# Patient Record
Sex: Female | Born: 1995 | Race: White | Hispanic: No | Marital: Single | Smoking: Current every day smoker
Health system: Southern US, Community
[De-identification: ages and names within clinical notes are randomized; demographics above are authoritative.]

## PROBLEM LIST (undated history)

## (undated) ENCOUNTER — Inpatient Hospital Stay: Payer: Self-pay

## (undated) ENCOUNTER — Emergency Department: Payer: Medicaid Other | Source: Home / Self Care

## (undated) DIAGNOSIS — D649 Anemia, unspecified: Secondary | ICD-10-CM

## (undated) DIAGNOSIS — K219 Gastro-esophageal reflux disease without esophagitis: Secondary | ICD-10-CM

## (undated) DIAGNOSIS — S0300XA Dislocation of jaw, unspecified side, initial encounter: Secondary | ICD-10-CM

## (undated) DIAGNOSIS — L409 Psoriasis, unspecified: Secondary | ICD-10-CM

## (undated) DIAGNOSIS — O4590 Premature separation of placenta, unspecified, unspecified trimester: Secondary | ICD-10-CM

## (undated) DIAGNOSIS — Z3201 Encounter for pregnancy test, result positive: Secondary | ICD-10-CM

## (undated) DIAGNOSIS — G43909 Migraine, unspecified, not intractable, without status migrainosus: Secondary | ICD-10-CM

## (undated) HISTORY — PX: WISDOM TOOTH EXTRACTION: SHX21

## (undated) HISTORY — DX: Encounter for pregnancy test, result positive: Z32.01

## (undated) HISTORY — DX: Psoriasis, unspecified: L40.9

## (undated) HISTORY — DX: Migraine, unspecified, not intractable, without status migrainosus: G43.909

## (undated) HISTORY — DX: Dislocation of jaw, unspecified side, initial encounter: S03.00XA

---
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
## 2013-01-15 LAB — LIPID PANEL
Cholesterol: 168 mg/dL (ref 0–200)
HDL: 28 mg/dL — AB (ref 35–70)
LDL CALC: 125 mg/dL
TRIGLYCERIDES: 73 mg/dL (ref 40–160)

## 2013-06-08 ENCOUNTER — Emergency Department: Payer: Self-pay | Admitting: Emergency Medicine

## 2013-06-08 LAB — URINALYSIS, COMPLETE
Bilirubin,UR: NEGATIVE
Hyaline Cast: 1
Leukocyte Esterase: NEGATIVE
Ph: 6 (ref 4.5–8.0)
RBC,UR: <1 /HPF (ref 0–5)
WBC UR: <1 /HPF (ref 0–5)

## 2013-06-08 LAB — BASIC METABOLIC PANEL
Anion Gap: 6 — ABNORMAL LOW (ref 7–16)
Calcium, Total: 8.8 mg/dL — ABNORMAL LOW (ref 9.0–10.7)
Chloride: 109 mmol/L — ABNORMAL HIGH (ref 98–107)
Co2: 26 mmol/L (ref 21–32)
EGFR (African American): >60
EGFR (Non-African Amer.): >60
Glucose: 115 mg/dL — ABNORMAL HIGH (ref 65–99)
Osmolality: 281 (ref 275–301)
Potassium: 3.6 mmol/L (ref 3.5–5.1)
Sodium: 141 mmol/L (ref 136–145)

## 2013-06-08 LAB — PREGNANCY, URINE: Pregnancy Test, Urine: NEGATIVE m[IU]/mL

## 2013-06-08 LAB — CBC
HCT: 37.6 % (ref 35.0–47.0)
MCH: 32.3 pg (ref 26.0–34.0)
MCHC: 34.6 g/dL (ref 32.0–36.0)
MCV: 93 fL (ref 80–100)
Platelet: 345 10*3/uL (ref 150–440)
RBC: 4.03 10*6/uL (ref 3.80–5.20)
RDW: 12.9 % (ref 11.5–14.5)

## 2013-06-08 LAB — GC/CHLAMYDIA PROBE AMP

## 2014-01-03 ENCOUNTER — Emergency Department: Payer: Self-pay | Admitting: Emergency Medicine

## 2014-01-03 LAB — URINALYSIS, COMPLETE
BLOOD: NEGATIVE
Bilirubin,UR: NEGATIVE
GLUCOSE, UR: NEGATIVE mg/dL (ref 0–75)
Ketone: NEGATIVE
Leukocyte Esterase: NEGATIVE
NITRITE: NEGATIVE
Ph: 6 (ref 4.5–8.0)
RBC,UR: 5 /HPF (ref 0–5)
SPECIFIC GRAVITY: 1.03 (ref 1.003–1.030)
Squamous Epithelial: 2

## 2014-04-02 ENCOUNTER — Emergency Department: Payer: Self-pay | Admitting: Emergency Medicine

## 2014-04-03 LAB — URINALYSIS, COMPLETE
BLOOD: NEGATIVE
Bilirubin,UR: NEGATIVE
Glucose,UR: NEGATIVE mg/dL (ref 0–75)
Ketone: NEGATIVE
LEUKOCYTE ESTERASE: NEGATIVE
NITRITE: POSITIVE
PH: 7 (ref 4.5–8.0)
Protein: NEGATIVE
RBC,UR: NONE SEEN /HPF (ref 0–5)
Specific Gravity: 1.015 (ref 1.003–1.030)
Squamous Epithelial: NONE SEEN
WBC UR: 2 /HPF (ref 0–5)

## 2014-05-19 ENCOUNTER — Emergency Department: Payer: Self-pay | Admitting: Internal Medicine

## 2014-05-20 ENCOUNTER — Other Ambulatory Visit: Payer: Self-pay | Admitting: Certified Nurse Midwife

## 2014-05-20 LAB — HCG, QUANTITATIVE, PREGNANCY: BETA HCG, QUANT.: 2083 m[IU]/mL — AB

## 2014-05-22 ENCOUNTER — Other Ambulatory Visit: Payer: Self-pay | Admitting: Certified Nurse Midwife

## 2014-05-22 LAB — HCG, QUANTITATIVE, PREGNANCY: BETA HCG, QUANT.: 5030 m[IU]/mL — AB

## 2014-06-08 ENCOUNTER — Other Ambulatory Visit: Payer: Self-pay | Admitting: Obstetrics & Gynecology

## 2014-07-12 ENCOUNTER — Emergency Department: Payer: Self-pay | Admitting: Emergency Medicine

## 2014-07-12 LAB — COMPREHENSIVE METABOLIC PANEL
ALK PHOS: 65 U/L
ANION GAP: 8 (ref 7–16)
AST: 12 U/L — AB (ref 15–37)
Albumin: 3.1 g/dL — ABNORMAL LOW (ref 3.4–5.0)
BUN: 7 mg/dL (ref 7–18)
Bilirubin,Total: 0.4 mg/dL (ref 0.2–1.0)
Calcium, Total: 8 mg/dL — ABNORMAL LOW (ref 8.5–10.1)
Chloride: 108 mmol/L — ABNORMAL HIGH (ref 98–107)
Co2: 22 mmol/L (ref 21–32)
Creatinine: 0.5 mg/dL — ABNORMAL LOW (ref 0.60–1.30)
EGFR (African American): >60
EGFR (Non-African Amer.): >60
Glucose: 87 mg/dL (ref 65–99)
Osmolality: 273 (ref 275–301)
Potassium: 3.1 mmol/L — ABNORMAL LOW (ref 3.5–5.1)
SGPT (ALT): 16 U/L (ref 12–78)
Sodium: 138 mmol/L (ref 136–145)
Total Protein: 6.2 g/dL — ABNORMAL LOW (ref 6.4–8.2)

## 2014-07-12 LAB — CBC
HCT: 32.2 % — AB (ref 35.0–47.0)
HGB: 11.4 g/dL — AB (ref 12.0–16.0)
MCH: 33.3 pg (ref 26.0–34.0)
MCHC: 35.4 g/dL (ref 32.0–36.0)
MCV: 94 fL (ref 80–100)
Platelet: 301 10*3/uL (ref 150–440)
RBC: 3.42 10*6/uL — ABNORMAL LOW (ref 3.80–5.20)
RDW: 12.3 % (ref 11.5–14.5)
WBC: 9.3 10*3/uL (ref 3.6–11.0)

## 2014-07-12 LAB — HCG, QUANTITATIVE, PREGNANCY: Beta Hcg, Quant.: 97189 m[IU]/mL — ABNORMAL HIGH

## 2014-07-13 ENCOUNTER — Emergency Department: Payer: Self-pay | Admitting: Emergency Medicine

## 2014-08-12 ENCOUNTER — Emergency Department: Payer: Self-pay | Admitting: Emergency Medicine

## 2014-08-12 LAB — COMPREHENSIVE METABOLIC PANEL
ALT: 31 U/L
AST: 38 U/L — AB (ref 15–37)
Albumin: 2.8 g/dL — ABNORMAL LOW (ref 3.4–5.0)
Alkaline Phosphatase: 58 U/L
Anion Gap: 12 (ref 7–16)
BUN: 3 mg/dL — ABNORMAL LOW (ref 7–18)
Bilirubin,Total: 0.3 mg/dL (ref 0.2–1.0)
CHLORIDE: 110 mmol/L — AB (ref 98–107)
CO2: 20 mmol/L — AB (ref 21–32)
CREATININE: 0.62 mg/dL (ref 0.60–1.30)
Calcium, Total: 8.3 mg/dL — ABNORMAL LOW (ref 8.5–10.1)
EGFR (African American): >60
GLUCOSE: 98 mg/dL (ref 65–99)
Osmolality: 280 (ref 275–301)
POTASSIUM: 3 mmol/L — AB (ref 3.5–5.1)
Sodium: 142 mmol/L (ref 136–145)
Total Protein: 6.1 g/dL — ABNORMAL LOW (ref 6.4–8.2)

## 2014-08-12 LAB — CBC
HCT: 30.6 % — ABNORMAL LOW (ref 35.0–47.0)
HGB: 10.6 g/dL — ABNORMAL LOW (ref 12.0–16.0)
MCH: 33.4 pg (ref 26.0–34.0)
MCHC: 34.8 g/dL (ref 32.0–36.0)
MCV: 96 fL (ref 80–100)
Platelet: 324 10*3/uL (ref 150–440)
RBC: 3.19 10*6/uL — AB (ref 3.80–5.20)
RDW: 12.7 % (ref 11.5–14.5)
WBC: 8.7 10*3/uL (ref 3.6–11.0)

## 2014-08-12 LAB — URINALYSIS, COMPLETE
BLOOD: NEGATIVE
Bilirubin,UR: NEGATIVE
Glucose,UR: NEGATIVE mg/dL (ref 0–75)
Ketone: NEGATIVE
Leukocyte Esterase: NEGATIVE
NITRITE: NEGATIVE
Ph: 7 (ref 4.5–8.0)
Protein: NEGATIVE
RBC,UR: <1 /HPF (ref 0–5)
Specific Gravity: 1.005 (ref 1.003–1.030)

## 2014-08-12 LAB — WET PREP, GENITAL

## 2014-08-12 LAB — LIPASE, BLOOD: Lipase: 138 U/L (ref 73–393)

## 2014-08-12 LAB — GC/CHLAMYDIA PROBE AMP

## 2014-08-14 LAB — URINE CULTURE

## 2014-09-12 ENCOUNTER — Encounter: Payer: Self-pay | Admitting: Maternal & Fetal Medicine

## 2014-10-06 ENCOUNTER — Observation Stay: Payer: Self-pay

## 2014-10-06 LAB — URINALYSIS, COMPLETE
BLOOD: NEGATIVE
Bilirubin,UR: NEGATIVE
GLUCOSE, UR: NEGATIVE mg/dL (ref 0–75)
Ketone: NEGATIVE
Leukocyte Esterase: NEGATIVE
NITRITE: NEGATIVE
PH: 7 (ref 4.5–8.0)
PROTEIN: NEGATIVE
RBC,UR: 1 /HPF (ref 0–5)
Specific Gravity: 1.003 (ref 1.003–1.030)
Squamous Epithelial: 2
WBC UR: 3 /HPF (ref 0–5)

## 2014-10-24 ENCOUNTER — Encounter: Payer: Self-pay | Admitting: Obstetrics & Gynecology

## 2014-11-01 ENCOUNTER — Ambulatory Visit: Payer: Self-pay | Admitting: Certified Nurse Midwife

## 2014-11-01 LAB — GLUCOSE, 1 HOUR GESTATIONAL: Glucose 1 Hour: 125

## 2014-11-13 ENCOUNTER — Observation Stay: Payer: Self-pay | Admitting: Obstetrics & Gynecology

## 2014-12-05 ENCOUNTER — Encounter: Payer: Self-pay | Admitting: Obstetrics and Gynecology

## 2014-12-12 ENCOUNTER — Observation Stay: Payer: Self-pay

## 2014-12-12 LAB — URINALYSIS, COMPLETE
BILIRUBIN, UR: NEGATIVE
Blood: NEGATIVE
GLUCOSE, UR: NEGATIVE mg/dL (ref 0–75)
Ketone: NEGATIVE
Leukocyte Esterase: NEGATIVE
Nitrite: NEGATIVE
Ph: 7 (ref 4.5–8.0)
Protein: NEGATIVE
RBC,UR: NONE SEEN /HPF (ref 0–5)
Specific Gravity: 1.004 (ref 1.003–1.030)

## 2014-12-25 ENCOUNTER — Observation Stay: Payer: Self-pay

## 2015-01-05 ENCOUNTER — Observation Stay: Payer: Self-pay | Admitting: Obstetrics and Gynecology

## 2015-01-05 LAB — DRUG SCREEN, URINE
Amphetamines, Ur Screen: NEGATIVE (ref ?–1000)
BARBITURATES, UR SCREEN: NEGATIVE (ref ?–200)
Benzodiazepine, Ur Scrn: NEGATIVE (ref ?–200)
COCAINE METABOLITE, UR ~~LOC~~: NEGATIVE (ref ?–300)
Cannabinoid 50 Ng, Ur ~~LOC~~: NEGATIVE (ref ?–50)
MDMA (ECSTASY) UR SCREEN: NEGATIVE (ref ?–500)
Methadone, Ur Screen: NEGATIVE (ref ?–300)
OPIATE, UR SCREEN: NEGATIVE (ref ?–300)
Phencyclidine (PCP) Ur S: NEGATIVE (ref ?–25)
Tricyclic, Ur Screen: NEGATIVE (ref ?–1000)

## 2015-01-05 LAB — URINALYSIS, COMPLETE
BILIRUBIN, UR: NEGATIVE
Blood: NEGATIVE
Glucose,UR: NEGATIVE mg/dL (ref 0–75)
Ketone: NEGATIVE
Nitrite: NEGATIVE
Ph: 7 (ref 4.5–8.0)
Protein: NEGATIVE
Specific Gravity: 1.004 (ref 1.003–1.030)
WBC UR: 6 /HPF (ref 0–5)

## 2015-01-11 ENCOUNTER — Ambulatory Visit: Payer: Self-pay | Admitting: Obstetrics & Gynecology

## 2015-01-11 LAB — CBC WITH DIFFERENTIAL/PLATELET
Basophil #: 0 10*3/uL (ref 0.0–0.1)
Basophil %: 0.1 %
EOS PCT: 0.7 %
Eosinophil #: 0.1 10*3/uL (ref 0.0–0.7)
HCT: 34.5 % — ABNORMAL LOW (ref 35.0–47.0)
HGB: 11.7 g/dL — ABNORMAL LOW (ref 12.0–16.0)
LYMPHS PCT: 18.4 %
Lymphocyte #: 2.4 10*3/uL (ref 1.0–3.6)
MCH: 33.4 pg (ref 26.0–34.0)
MCHC: 33.7 g/dL (ref 32.0–36.0)
MCV: 99 fL (ref 80–100)
MONO ABS: 0.7 x10 3/mm (ref 0.2–0.9)
Monocyte %: 5.3 %
Neutrophil #: 9.7 10*3/uL — ABNORMAL HIGH (ref 1.4–6.5)
Neutrophil %: 75.5 %
PLATELETS: 355 10*3/uL (ref 150–440)
RBC: 3.49 10*6/uL — AB (ref 3.80–5.20)
RDW: 13.2 % (ref 11.5–14.5)
WBC: 12.8 10*3/uL — AB (ref 3.6–11.0)

## 2015-01-12 ENCOUNTER — Inpatient Hospital Stay: Payer: Self-pay | Admitting: Obstetrics & Gynecology

## 2015-01-12 LAB — GC/CHLAMYDIA PROBE AMP

## 2015-01-13 LAB — HEMATOCRIT: HCT: 27.7 % — AB (ref 35.0–47.0)

## 2015-04-23 NOTE — Op Note (Signed)
PATIENT NAME:  Carla Ingram, Jennene K MR#:  [id number] DATE OF BIRTH:  February 26, 1996  DATE OF PROCEDURE:  01/12/2015  POSTOPERATIVE DIAGNOSIS: Term intrauterine pregnancy, prior history of cesarean section.   POSTOPERATIVE DIAGNOSES: Term intrauterine pregnancy, prior history of cesarean section.   PROCEDURE PERFORMED:  1. Low transverse cesarean section.  2. Placement of On-Q pain pump.   SURGEON: Dierdre Searles. Paul Onesha Krebbs, MD    ASSISTANT: Florina OuAndreas M. Bonney AidStaebler, MD   ANESTHESIA: Spinal.   ESTIMATED BLOOD LOSS: 250 mL.   COMPLICATIONS: None.   FINDINGS: Normal tubes, ovaries, uterus, normal viable female infant with Apgar scores of 9 and 9 and weight of 610.   DISPOSITION:  To the recovery room in stable condition.   TECHNIQUE: The patient was prepped and draped in the usual sterile fashion after adequate anesthesia is obtained in the supine position on the operating room table. A scalpel was used to create a low transverse skin incision that is extended at the level of the rectus fascia, which was then dissected bilaterally using Mayo scissors. The rectus muscles were separated from midline and from the peritoneum, and then the peritoneum is dissected free. The bladder is dissected away from the lower uterine segment and a retractor is placed. A scalpel was used to create a low transverse hysterotomy incision that is then extended by blunt dissection. Amniotomy then reveals clear fluid. The infant's head was grasped and delivered with suctioning of the oropharynx and then the remaining portion of the infant is delivered with clamping and cutting of the umbilical cord.   Cord blood is obtained and the placenta is manually extracted. The uterus is externalized and cleansed of all membranes and debris using a moist sponge. The hysterotomy incision is closed with a running #1 Vicryl suture in a locking fashion, followed by a 2nd layer to imbricate the 1st layer. Excellent hemostasis is noted.   The uterus is  placed back in the intra-abdominal cavity and the paracolic gutters are irrigated using warm saline. Re-examination of incision reveals excellent hemostasis. The peritoneum is closed with a Vicryl suture.   Trocars were placed through the abdomen into the subfascial space, and these are used to thread the SilverSoaker catheters related to the On-Q pain pump into place in the subfascial space. Next, 1 suture is used to close the rectus fascia. Subcutaneous tissues are irrigated and hemostasis is assured using electrocautery. Skin is closed with a 4-0 Vicryl suture in a subcuticular fashion, followed by placement of LiquiBand skin glue. The catheters are flushed with 5 mL each of bupivacaine and then stabilized in place with a Tegaderm bandage. The patient goes to the recovery room in stable condition. All sponge, instrument, and needle counts are correct.    ____________________________ R. Annamarie MajorPaul Avabella Wailes, MD rph:mw D: 01/12/2015 10:15:40 ET T: 01/12/2015 11:31:00 ET JOB#: [id number]  cc: Dierdre Searles. Paul Aidenjames Heckmann, MD, <Dictator> Nadara MustardOBERT P Christo Hain MD ELECTRONICALLY SIGNED 01/13/2015 2:26

## 2015-05-02 NOTE — H&P (Signed)
L&D Evaluation:  History:  HPI 19 yo G2 P1001 at 6859w0d by  EDD of 01/19/15, present with contractions.  Patient states she has been contracting since her last prenatal appointment on 01/03/15. Denies  LOF, VB reports +FM.    [facility], [facility] to [facility], tobacco use (1 cig every few days currently). Prior CS for abruption - pt has been seen by DP for this hx and  following fetal pelviectasis.  Repeat CS scheduled for 39 weeks. Has had multiple UTIs this pregnancy and was prescribed Macrobid prophyllaxis.  Seen in office today and given an RX for macrobid-has not always been taking as prescribed.   Presents with contractions, leaking fluid   Patient's Medical History HA, migraine   Patient's Surgical History Previous C-Section   Medications Pre Natal Vitamins  Macrobid   Allergies PCN, Augmentin-swelling and hives. Keflex ?   Social History tobacco   Family History Non-Contributory   ROS:  ROS see HPI   Exam:  Vital Signs stable  99/48   Urine Protein not completed   General no apparent distress   Mental Status clear   Abdomen gravid, non-tender   Estimated Fetal Weight Average for gestational age   Fetal Position cephalic   Back no CVAT   Edema no edema   Pelvic no external lesions, ftp/50/-4   Mebranes Intact   FHT normal rate with no decels, 140, moderate, no accels, no decels   Ucx absent   Impression:  Impression IUP at [redacted] weeks gestation likely discomforts of pregnancy   Plan:  Comments 1) R/O labor - will monitor and recheck - history of prior abduction no evidence of decels, VB, fundal tenderness - UA and UDS  2) Fetus - non-reactive tracing at present monitor  - being monitored for bilateral renal pelviectasis  3) History of prior C-section - repeat scheduled 01/12/15  4) O pos / RNI / VZI  5) Disposition - pending recheck   Follow Up Appointment already scheduled   Electronic Signatures for Addendum Section:  Lorrene ReidStaebler, Leilynn Pilat M  (MD) (Signed Addendum 14-Jan-16 11:18)  CVX unchanged on recheck UDS negative by verabl report   Electronic Signatures: Lorrene ReidStaebler, Cecilio Ohlrich M (MD)  (Signed 14-Jan-16 10:36)  Authored: L&D Evaluation   Last Updated: 14-Jan-16 11:18 by Lorrene ReidStaebler, Mackay Hanauer M (MD)

## 2015-05-02 NOTE — H&P (Signed)
L&D Evaluation:  History Expanded:  HPI 19 yo G2 P1001, EDD of 01/19/15, presents at 25w 2d with c/o low abdominal pain x 4-5 days, worsening tonight. Pain is not contraction-like, worsens after she removes pregnancy support belt. Denies LOF, VB, decreased FM or contractions. [facility], tobacco use (1 cig every few days currently). Prior CS for abruption - pt has been seen by DP and has f/u in November. Also following fetal pelviectasis.   Blood Type (Maternal) O positive   Group B Strep Results Maternal (Result >5wks must be treated as unknown) unknown/result > 5 weeks ago   Maternal HIV Negative   Maternal Syphilis Ab Nonreactive   Maternal Varicella Immune   Rubella Results (Maternal) nonimmune   Patient's Medical History HA, migraine   Patient's Surgical History Previous C-Section   Medications Pre Natal Vitamins   Allergies PCN, Augmentin, Keflex   Social History tobacco   ROS:  ROS see HPI   Exam:  Vital Signs stable   General no apparent distress   Mental Status clear   Abdomen gravid, non-tender   Edema no edema   Pelvic no external lesions, cervix closed and thick, wet mount: + yeast, no clue, negative whiff   FHT +FHTs   Ucx absent   Impression:  Impression IUP at 25 wks, yeast vaginitis   Plan:  Comments 3 Urine cultures with E. Coli during pregnancy. Pt reports taking Rx'd antibiotics. Will start on daily Macrobid PPx.   Diflucan Rx for Yeast  Encouraged to wear support belt consistently   Electronic Signatures: Vella KohlerBrothers, Treshon Stannard K (CNM)  (Signed 15-Oct-15 03:05)  Authored: L&D Evaluation   Last Updated: 15-Oct-15 03:05 by Vella KohlerBrothers, Mischele Detter K (CNM)

## 2015-05-02 NOTE — H&P (Signed)
L&D Evaluation:  History:  HPI 19 yo G2 P1001, EDD of 01/19/15, presents at 34w 5d with c/o contractions q 7-8 minutes accompanied by back pain this PM. Had sharp lower abdimal pain with the ctxs "just like when she had an abruption with her first baby." Denies LOF, VB, or decreased FM.  [facility], [facility], tobacco use (1 cig every few days currently). Prior CS for abruption - pt has been seen by DP and has f/u in November. Also following fetal pelviectasis.Has had multiple UTIs this pregnancy and was prescribed Macrobid prophyllaxis. Ran out of Macrobid a month ago. Seen in office today and given an RX for macrobid-has not started yet. Has drunk onlu 1 can of soda and one bottle of water, although urine looks dilute. Frequency, but no dysuria. Back pain in sacrum and extends up her spine.   Presents with back pain, contractions   Patient's Medical History HA, migraine   Patient's Surgical History Previous C-Section   Medications Pre Natal Vitamins   Allergies PCN, Augmentin-swelling and hives. Keflex ?   Social History tobacco   ROS:  ROS see HPI   Exam:  Vital Signs stable  130/60   Urine Protein negative dipstick, 3+ bacteria   General no apparent distress   Mental Status clear   Abdomen gravid, non-tender, except in LUS with Leopold's   Estimated Fetal Weight Average for gestational age   Fetal Position cephalic   Edema no edema   Reflexes 1+    Pelvic no external lesions, closed/60%/-2 and ballotable   Mebranes Intact   FHT normal rate with no decels, 130s with accels to 150s to 160   FHT Description Mod variability   Ucx occasional ctx, uterine irritability-decreased with po hydration   Skin dry   Other On ultrasound-placenta anterior, adequate AFI, cephalic presentation   Impression:  Impression IUP at 34 5/7 weeks, not in labor. No sign of abruption. reactive NST   Plan:  Plan Reassured that there are no signs of an abruption. DC home  after giving dose of Macrobid/. Urine culture sent. FU in office as scheduled.    Electronic Signatures: Trinna BalloonGutierrez, Louvina Cleary L (CNM)  (Signed 22-Dec-15 03:20)  Authored: L&D Evaluation   Last Updated: 22-Dec-15 03:20 by Trinna BalloonGutierrez, Taelyn Broecker L (CNM)

## 2015-05-02 NOTE — H&P (Signed)
L&D Evaluation:  History:  HPI 19 yo G2 P1001, EDD of 01/19/15, presents at 36w 3d with c/o LOF and pelvic pressure since 2000 tonight. Fluid onunderwear appeared white. Denies vulvar itching or irritation. Denies  VB, or decreased FM.  [facility], tobacco use (1 cig every few days currently). Prior CS for abruption - pt has been seen by DP for this hx and  following fetal pelviectasis.  Repeat CS scheduled for 39 weeks. Has had multiple UTIs this pregnancy and was prescribed Macrobid prophyllaxis.  Seen in office today and given an RX for macrobid-has not always been taking as prescribed.   Presents with leaking fluid   Patient's Medical History HA, migraine   Patient's Surgical History Previous C-Section   Medications Pre Natal Vitamins  Macrobid   Allergies PCN, Augmentin-swelling and hives. Keflex ?   Social History tobacco   Family History Non-Contributory    ROS:  ROS see HPI   Exam:  Vital Signs stable  119/68    Urine Protein not completed   General no apparent distress   Mental Status clear    Abdomen gravid, non-tender   Estimated Fetal Weight Average for gestational age   Fetal Position cephalic   Pelvic SSE: scant white discharge. WEt prep negative. fern and Nitrazine negative. CX: closed/60%/-2   Mebranes Intact   FHT normal rate with no decels, 135-140 with accels to 160-170   Ucx absent   Skin dry   Impression:  Impression IUP at 36.3 weeks with no evidence of SROM. Reactive NST.   Plan:  Plan DC home with labor precautions   Electronic Signatures: Trinna BalloonGutierrez, Kalmen Lollar L (CNM)  (Signed 04-Jan-16 01:46)  Authored: L&D Evaluation   Last Updated: 04-Jan-16 01:46 by Trinna BalloonGutierrez, Patrina Andreas L (CNM)

## 2015-06-02 IMAGING — US US PELV - US TRANSVAGINAL
1 series · 13 of 25 positions shown · non-contrast
Comparison: none

REASON FOR EXAM: central pelvic pain
COMMENTS:   May transport without cardiac monitor

[Series 1: us pelv - us transvaginal · 0.25mm/px · 123 acquisitions, 13 frames shown]
[im 1/123]
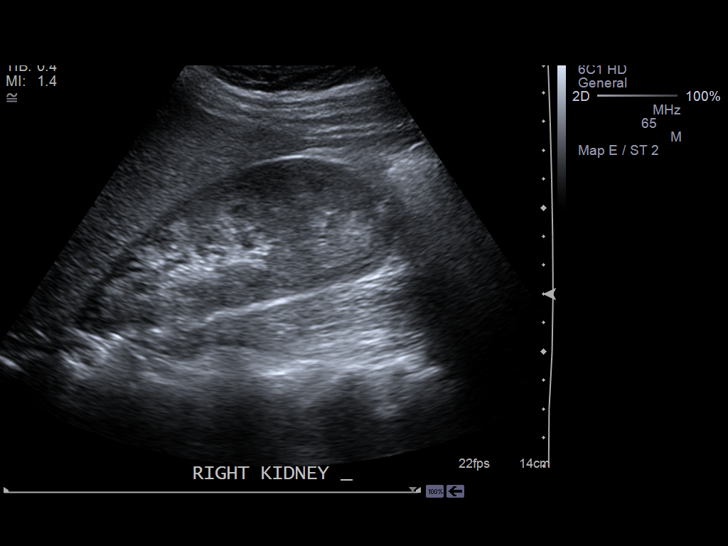
[im 11/123]
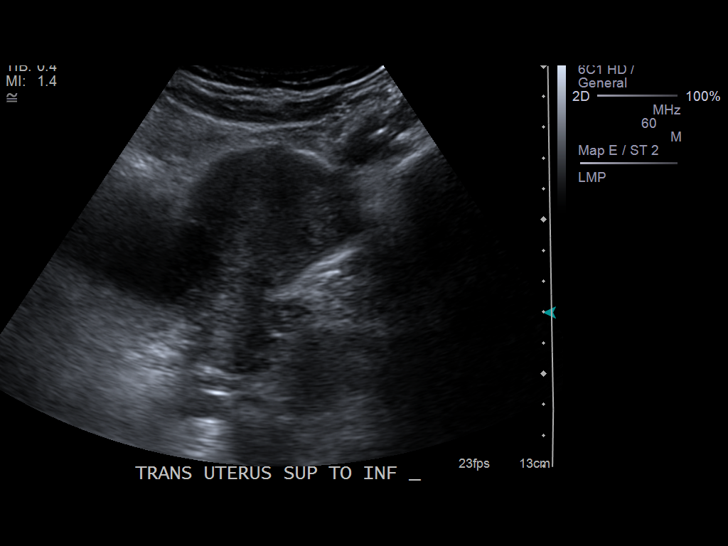
[im 21/123]
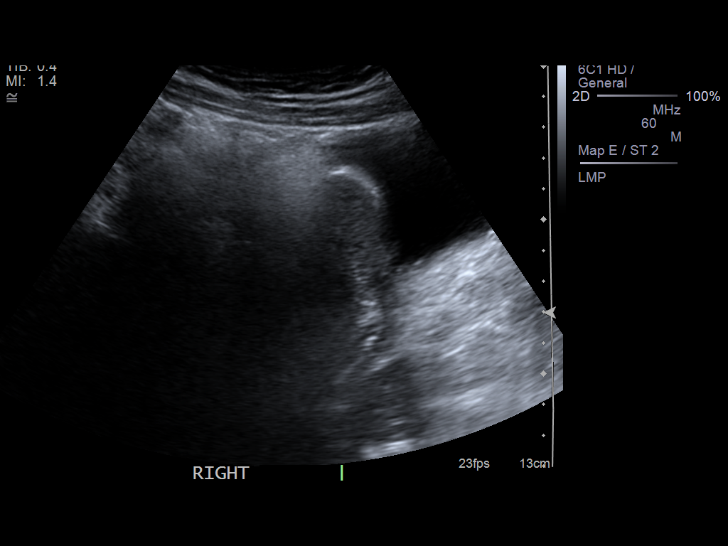
[im 31/123]
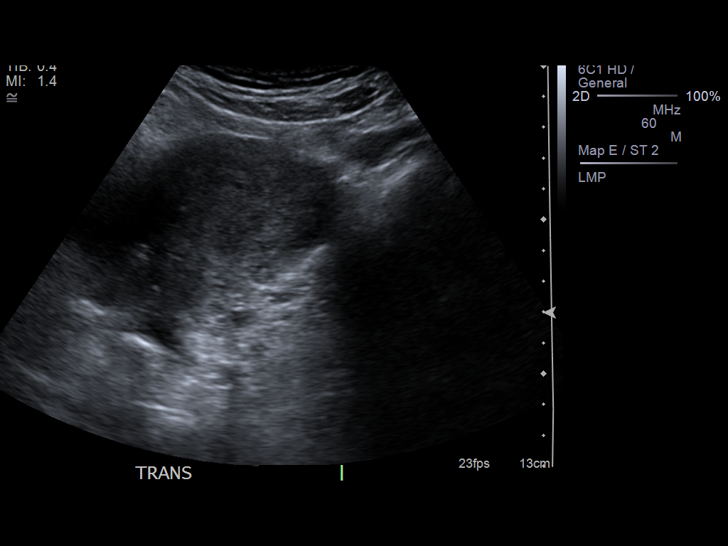
[im 41/123]
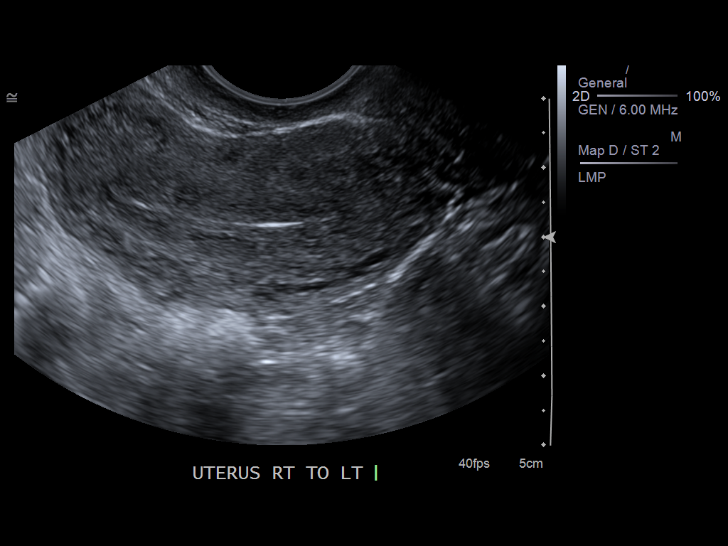
[im 51/123]
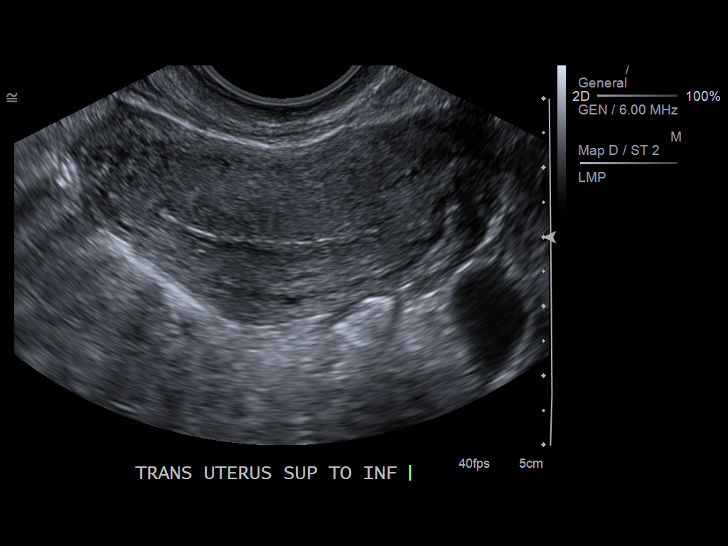
[im 62/123]
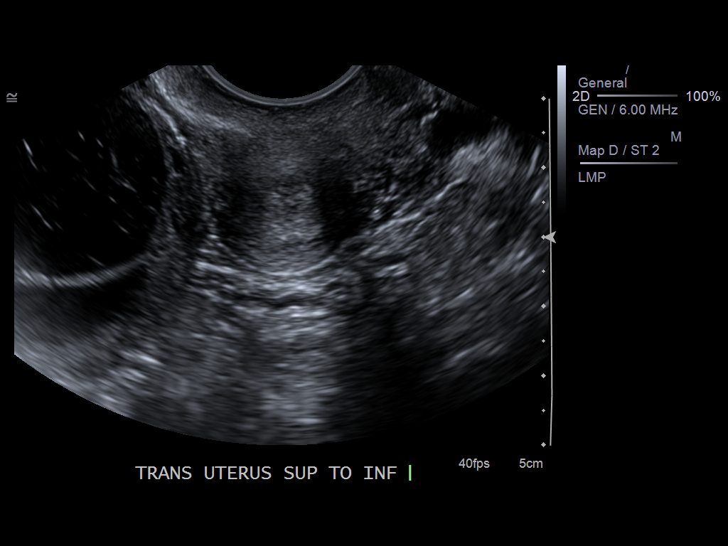
[im 72/123]
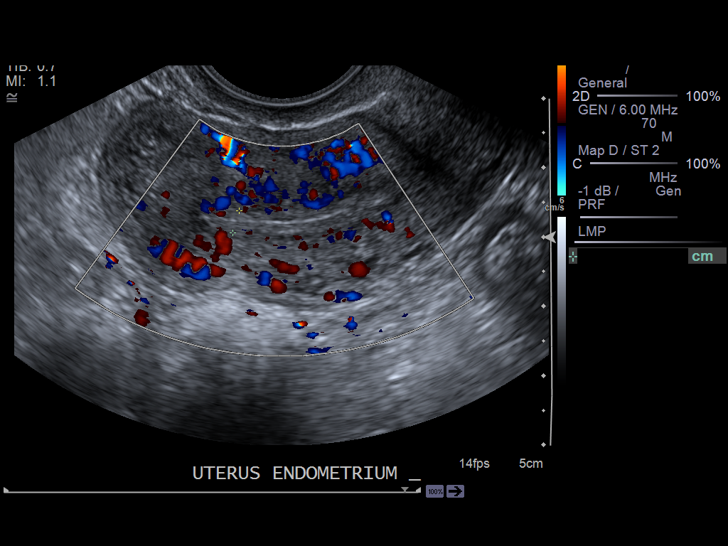
[im 82/123]
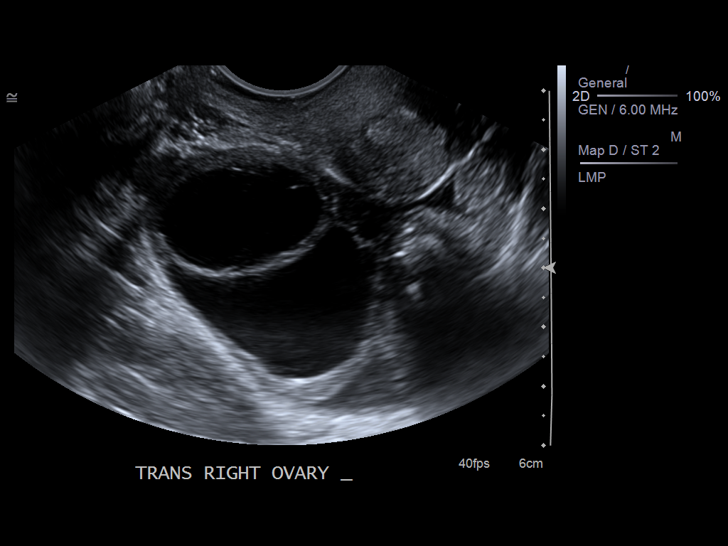
[im 92/123]
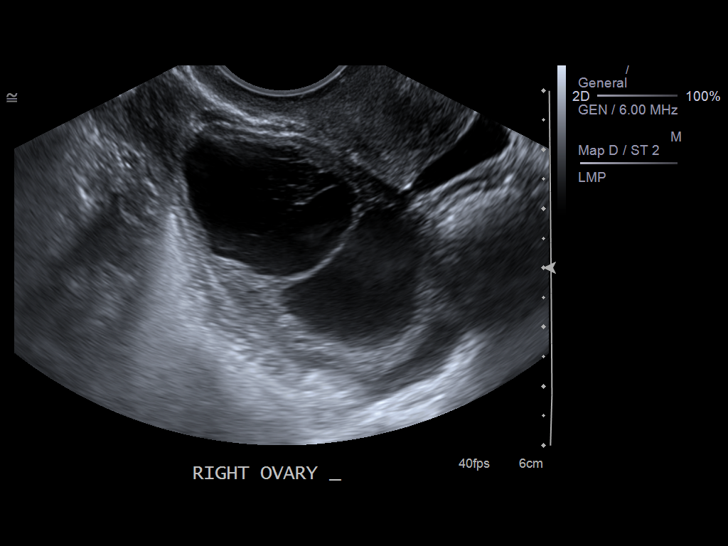
[im 102/123]
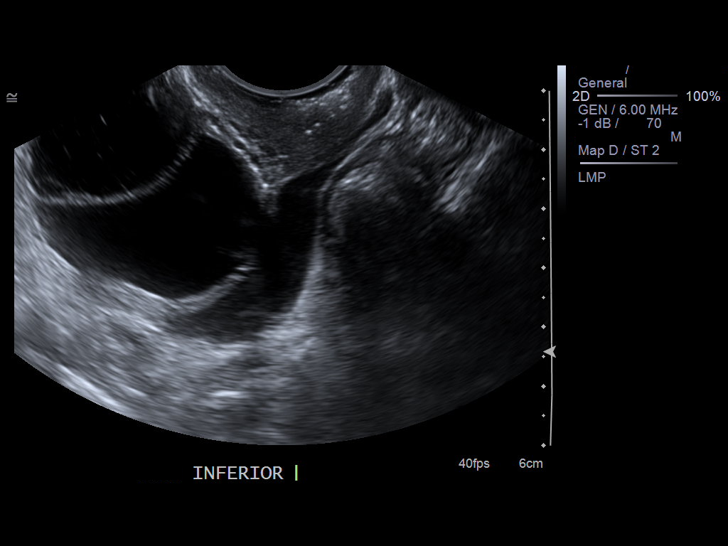
[im 112/123]
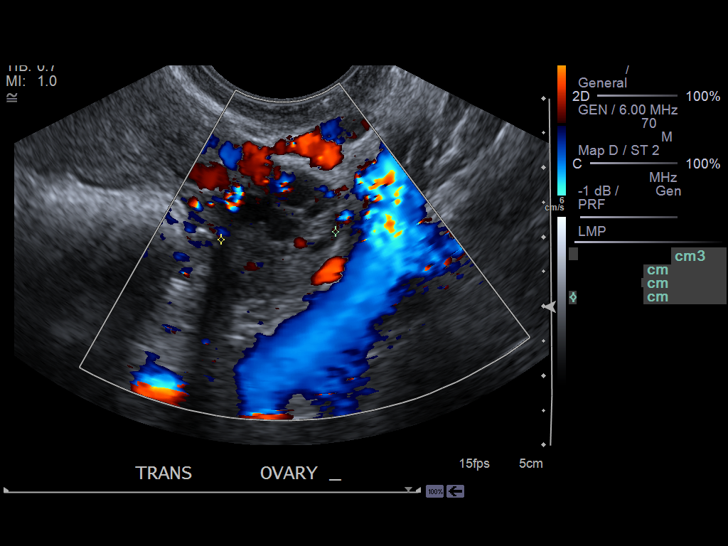
[im 123/123]
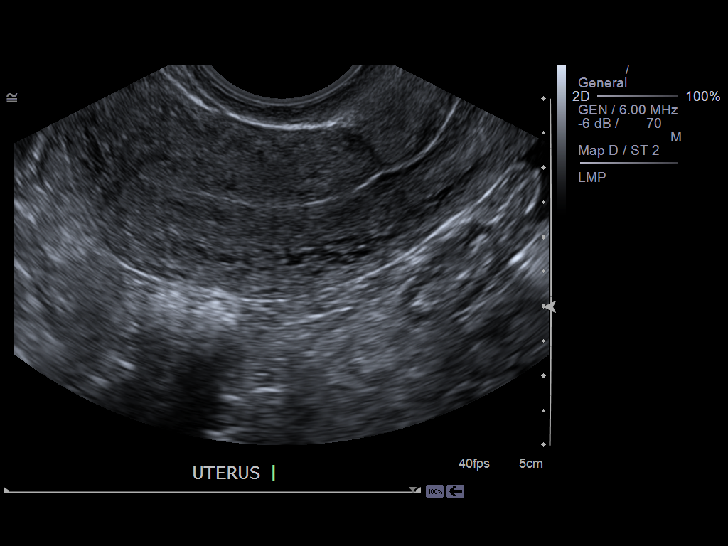

[13 of 25 positions shown; findings below may reference images not displayed]

PROCEDURE:     US  - US PELVIS EXAM W/TRANSVAGINAL  - June 08, 2013  [DATE]

RESULT:     Emergent pelvic sonogram is performed with transabdominal
imaging initially and subsequently with endovaginal scanning for improved
visualization. The uterus measures 7.50 x 6.6 x 3.55 cm. The right ovary
measures 4.77 x 3.19 x 3.58 cm. The left ovary measures 2.60 x 1.71 x
cm. Blood flow is seen in both ovaries. There is a large predominantly
hypoechoic collection in the area the right ovary measuring 2.94 x 2.14 x
2.75 cm. There is some internal echogenicity which appears to possibly
represent debris. A second adjacent similar cyst without internal echoes is
present measuring 3.32 x 2.03 x 2.63 cm. There is a tubular appearing
structure in the right adnexa measuring 4.24 x 1.15 x 2.15 cm which could
represent a fluid-filled 2 or free fluid. No hydronephrosis is seen in
either kidney on survey images.
IMPRESSION: 1. Complex cystic structure in the area the right ovary as described with
debris. This could represent a complex ovarian cyst.. There is a second
adjacent simple cystic structure in the right ovary measuring 3.32 x 2.03 x
2.63 cm. There is also a tubular appearing fluid collection is seen in the
right adnexa. Correlate for underlying clinical symptoms of acute infectious
or inflammatory process to exclude tubo-ovarian abscess. Is if there are no
symptoms of that and gynecologic followup with repeat ultrasound to document
resolution is recommended.

[REDACTED]

## 2015-11-01 ENCOUNTER — Encounter: Payer: Self-pay | Admitting: Family Medicine

## 2015-11-01 ENCOUNTER — Ambulatory Visit (INDEPENDENT_AMBULATORY_CARE_PROVIDER_SITE_OTHER): Payer: Medicaid Other | Admitting: Family Medicine

## 2015-11-01 VITALS — BP 102/68 | HR 104 | Temp 98.5°F | Resp 18 | Ht 62.0 in | Wt 177.5 lb

## 2015-11-01 DIAGNOSIS — Z3201 Encounter for pregnancy test, result positive: Secondary | ICD-10-CM

## 2015-11-01 DIAGNOSIS — F17200 Nicotine dependence, unspecified, uncomplicated: Secondary | ICD-10-CM | POA: Insufficient documentation

## 2015-11-01 DIAGNOSIS — L408 Other psoriasis: Secondary | ICD-10-CM

## 2015-11-01 DIAGNOSIS — E538 Deficiency of other specified B group vitamins: Secondary | ICD-10-CM | POA: Diagnosis not present

## 2015-11-01 LAB — POCT URINE PREGNANCY: Preg Test, Ur: POSITIVE — AB

## 2015-11-01 MED ORDER — PRENATAL VITAMIN PLUS LOW IRON 27-1 MG PO TABS: 1.0000 | ORAL_TABLET | Freq: Every day | ORAL | Status: DC

## 2015-11-01 MED ORDER — TRIAMCINOLONE 0.1 % CREAM:EUCERIN CREAM 1:1: 1.0000 "application " | TOPICAL_CREAM | Freq: Two times a day (BID) | CUTANEOUS | Status: DC | PRN

## 2015-11-01 MED ORDER — VITAMIN B-12 1000 MCG SL SUBL: 1.0000 | SUBLINGUAL_TABLET | Freq: Every day | SUBLINGUAL | Status: DC

## 2015-11-01 NOTE — Progress Notes (Signed)
Name: Carla DykesJamy K Mcintire   MRN: [id number]    DOB: 09-27-1996   Date:11/01/2015       Progress Note  Subjective  Chief Complaint  Chief Complaint  Patient presents with  . Amenorrhea    Patient has not had a normal period since September and broke up with fiance 2 week ago and quit birth control pill. Patient has been nausea and has had breast tenderness and took a pregnancy test that came back with a sligh positive line.    HPI  Pregnancy Test Positive: she states cycles are usually regular, however LMP mid September, states usually compliant with medication, but quit a couple weeks ago and cycle never started back. Had two positive pregnancy tests at home. She has been nauseated and breasts are tender. She has not started prenatal vitamins. Still dating father of her second daughter. They have been together for the past 2 years. She is still smoking  Psoriasis: out of control with lots of scaly plaques and itching, using topical hydroxycortisone with mild improvement  Patient Active Problem List   Diagnosis Date Noted  . B12 deficiency 11/01/2015  . Tobacco use disorder 11/01/2015  . 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 Other psoriasis 02/26/2010    Past Surgical History  Procedure Laterality Date  . Cesarean section  [id number]  . Dilation and curettage of uterus  2009    Family History  Problem Relation Age of Onset  . CVA Mother     Social History   Social History  . Marital Status: Single    Spouse Name: N/A  . Number of Children: N/A  . Years of Education: N/A   Occupational History  . Not on file.   Social History Main Topics  . Smoking status: Current Every Day Smoker -- 0.50 packs/day for 11 years    Types: Cigarettes    Start date: 10/31/2004  . Smokeless tobacco: Never Used  . Alcohol Use: No  . Drug Use: No  . Sexual Activity:    Partners: Male    Birth Control/ Protection: Pill   Other Topics Concern  . Not on file   Social History  Narrative  . No narrative on file     Current outpatient prescriptions:  .  Prenatal Vit-Fe Fumarate-FA (PRENATAL VITAMIN PLUS LOW IRON) 27-1 MG TABS, Take 1 tablet by mouth daily., Disp: 30 tablet, Rfl: 0  Allergies  Allergen Reactions  . Augmentin [Amoxicillin-Pot Clavulanate] Hives and Swelling  . Cephalexin   . Penicillins Hives and Swelling     ROS  Constitutional: Negative for fever , some weight change.  Respiratory: Negative for cough and shortness of breath.   Cardiovascular: Negative for chest pain or palpitations.  Gastrointestinal: Negative for abdominal pain, no bowel changes.  Musculoskeletal: Negative for gait problem or joint swelling.  Skin: Positive  for rash.  Neurological: Negative for dizziness or headache.  No other specific complaints in a complete review of systems (except as listed in HPI above).  Objective  Filed Vitals:   11/01/15 1106  BP: 102/68  Pulse: 104  Temp: 98.5 F (36.9 C)  TempSrc: Oral  Resp: 18  Height: 5\' 2"  (1.575 m)  Weight: 177 lb 8 oz (80.513 kg)  SpO2: 98%    Body mass index is 32.46 kg/(m^2).  Physical Exam  Constitutional: Patient appears well-developed and well-nourished. Obese  No distress.  HEENT: head atraumatic, normocephalic, pupils equal and reactive to light,neck supple, throat within normal limits Cardiovascular: Normal rate, regular  rhythm and normal heart sounds.  No murmur heard. No BLE edema. Pulmonary/Chest: Effort normal and breath sounds normal. No respiratory distress. Abdominal: Soft.  There is no tenderness. Skin: erythematous papules with scales worse on both elbows, knees, also some on trunk , arms and legs Psychiatric: Patient has a normal mood and affect. behavior is normal. Judgment and thought content normal.  Recent Results (from the past 2160 hour(s))  POCT urine pregnancy     Status: Abnormal   Collection Time: 11/01/15 11:13 AM  Result Value Ref Range   Preg Test, Ur Positive (A)  Negative    PHQ2/9: Depression screen PHQ 2/9 11/01/2015  Decreased Interest 0  Down, Depressed, Hopeless 0  PHQ - 2 Score 0     Fall Risk: Fall Risk  11/01/2015  Falls in the past year? No     Functional Status Survey: Is the patient deaf or have difficulty hearing?: No Does the patient have difficulty seeing, even when wearing glasses/contacts?: No Does the patient have difficulty concentrating, remembering, or making decisions?: No Does the patient have difficulty walking or climbing stairs?: No Does the patient have difficulty dressing or bathing?: No Does the patient have difficulty doing errands alone such as visiting a doctor's office or shopping?: No    Assessment & Plan  1. Pregnancy test positive  - POCT urine pregnancy - Prenatal Vit-Fe Fumarate-FA (PRENATAL VITAMIN PLUS LOW IRON) 27-1 MG TABS; Take 1 tablet by mouth daily.  Dispense: 30 tablet; Refill: 0 - Ambulatory referral to Obstetrics / Gynecology She needs to stop smoking, no alcohol, resume prenatal vitamin, and follow up with OB  2. Other psoriasis  - Triamcinolone Acetonide (TRIAMCINOLONE 0.1 % CREAM : EUCERIN) CREA; Apply 1 application topically 2 (two) times daily as needed.  Dispense: 1 each; Refill: 0  3. B12 deficiency  - Cyanocobalamin (VITAMIN B-12) 1000 MCG SUBL; Place 1 tablet (1,000 mcg total) under the tongue daily.  Dispense: 30 tablet; Refill: 0

## 2015-11-06 LAB — OB RESULTS CONSOLE RPR: RPR: NONREACTIVE

## 2015-11-06 LAB — OB RESULTS CONSOLE HEPATITIS B SURFACE ANTIGEN: Hepatitis B Surface Ag: NEGATIVE

## 2015-11-21 LAB — OB RESULTS CONSOLE ABO/RH: RH Type: POSITIVE

## 2015-11-21 LAB — OB RESULTS CONSOLE RUBELLA ANTIBODY, IGM: Rubella: IMMUNE

## 2015-11-21 LAB — OB RESULTS CONSOLE VARICELLA ZOSTER ANTIBODY, IGG: VARICELLA IGG: IMMUNE

## 2015-12-12 ENCOUNTER — Encounter: Payer: Self-pay | Admitting: Family Medicine

## 2016-01-18 ENCOUNTER — Encounter: Payer: Self-pay | Admitting: Urgent Care

## 2016-01-18 ENCOUNTER — Emergency Department
Admission: EM | Admit: 2016-01-18 | Discharge: 2016-01-18 | Disposition: A | Discharge status: Home / Self Care | Payer: Medicaid Other | Attending: Student | Admitting: Student

## 2016-01-18 DIAGNOSIS — R197 Diarrhea, unspecified: Secondary | ICD-10-CM | POA: Insufficient documentation

## 2016-01-18 DIAGNOSIS — O99332 Smoking (tobacco) complicating pregnancy, second trimester: Secondary | ICD-10-CM | POA: Insufficient documentation

## 2016-01-18 DIAGNOSIS — O9989 Other specified diseases and conditions complicating pregnancy, childbirth and the puerperium: Secondary | ICD-10-CM | POA: Diagnosis not present

## 2016-01-18 DIAGNOSIS — Z79899 Other long term (current) drug therapy: Secondary | ICD-10-CM | POA: Insufficient documentation

## 2016-01-18 DIAGNOSIS — R1013 Epigastric pain: Secondary | ICD-10-CM | POA: Diagnosis not present

## 2016-01-18 DIAGNOSIS — O210 Mild hyperemesis gravidarum: Secondary | ICD-10-CM | POA: Insufficient documentation

## 2016-01-18 DIAGNOSIS — R300 Dysuria: Secondary | ICD-10-CM | POA: Insufficient documentation

## 2016-01-18 DIAGNOSIS — F17210 Nicotine dependence, cigarettes, uncomplicated: Secondary | ICD-10-CM | POA: Insufficient documentation

## 2016-01-18 DIAGNOSIS — Z880 Allergy status to penicillin: Secondary | ICD-10-CM | POA: Diagnosis not present

## 2016-01-18 DIAGNOSIS — Z3A15 15 weeks gestation of pregnancy: Secondary | ICD-10-CM | POA: Insufficient documentation

## 2016-01-18 DIAGNOSIS — R1110 Vomiting, unspecified: Secondary | ICD-10-CM

## 2016-01-18 LAB — URINALYSIS COMPLETE WITH MICROSCOPIC (ARMC ONLY)
BILIRUBIN URINE: NEGATIVE
Bacteria, UA: NONE SEEN
GLUCOSE, UA: NEGATIVE mg/dL
Hgb urine dipstick: NEGATIVE
Leukocytes, UA: NEGATIVE
NITRITE: NEGATIVE
PROTEIN: 30 mg/dL — AB
SPECIFIC GRAVITY, URINE: 1.027 (ref 1.005–1.030)
pH: 5 (ref 5.0–8.0)

## 2016-01-18 LAB — CBC WITH DIFFERENTIAL/PLATELET
Basophils Absolute: 0.1 10*3/uL (ref 0–0.1)
Basophils Relative: 2 %
EOS ABS: 0 10*3/uL (ref 0–0.7)
EOS PCT: 0 %
HCT: 36.1 % (ref 35.0–47.0)
Hemoglobin: 12.6 g/dL (ref 12.0–16.0)
LYMPHS ABS: 0.8 10*3/uL — AB (ref 1.0–3.6)
Lymphocytes Relative: 8 %
MCH: 31.3 pg (ref 26.0–34.0)
MCHC: 34.8 g/dL (ref 32.0–36.0)
MCV: 90 fL (ref 80.0–100.0)
Monocytes Absolute: 0.2 10*3/uL (ref 0.2–0.9)
Monocytes Relative: 2 %
Neutro Abs: 8.9 10*3/uL — ABNORMAL HIGH (ref 1.4–6.5)
Neutrophils Relative %: 88 %
PLATELETS: 294 10*3/uL (ref 150–440)
RBC: 4.01 MIL/uL (ref 3.80–5.20)
RDW: 13.1 % (ref 11.5–14.5)
WBC: 10.1 10*3/uL (ref 3.6–11.0)

## 2016-01-18 LAB — HCG, QUANTITATIVE, PREGNANCY: hCG, Beta Chain, Quant, S: 34879 m[IU]/mL — ABNORMAL HIGH (ref <5)

## 2016-01-18 LAB — COMPREHENSIVE METABOLIC PANEL
ALT: 10 U/L — ABNORMAL LOW (ref 14–54)
ANION GAP: 10 (ref 5–15)
AST: 14 U/L — ABNORMAL LOW (ref 15–41)
Albumin: 3.4 g/dL — ABNORMAL LOW (ref 3.5–5.0)
Alkaline Phosphatase: 77 U/L (ref 38–126)
BUN: 10 mg/dL (ref 6–20)
CHLORIDE: 106 mmol/L (ref 101–111)
CO2: 19 mmol/L — AB (ref 22–32)
Calcium: 8.6 mg/dL — ABNORMAL LOW (ref 8.9–10.3)
Creatinine, Ser: 0.34 mg/dL — ABNORMAL LOW (ref 0.44–1.00)
GFR calc non Af Amer: >60 mL/min (ref >60)
Glucose, Bld: 103 mg/dL — ABNORMAL HIGH (ref 65–99)
POTASSIUM: 3.2 mmol/L — AB (ref 3.5–5.1)
SODIUM: 135 mmol/L (ref 135–145)
Total Bilirubin: 0.6 mg/dL (ref 0.3–1.2)
Total Protein: 6.5 g/dL (ref 6.5–8.1)

## 2016-01-18 LAB — LIPASE, BLOOD: Lipase: 17 U/L (ref 11–51)

## 2016-01-18 MED ORDER — SODIUM CHLORIDE 0.9 % IV BOLUS (SEPSIS): 1000.0000 mL | Freq: Once | INTRAVENOUS | Status: DC

## 2016-01-18 MED ORDER — SODIUM CHLORIDE 0.9 % IV BOLUS (SEPSIS)
1000.0000 mL | Freq: Once | INTRAVENOUS | Status: AC
  Administered 2016-01-18: 1000 mL via INTRAVENOUS

## 2016-01-18 NOTE — ED Notes (Signed)
Patient presents with c/o N/V since 0600 this am. Patient is a 15 week G3P2 and is followed by Dr. Tiburcio Pea; last visit was 2 weeks ago. (+) lower back and lower abd pain. (+) dysuria.

## 2016-01-18 NOTE — ED Provider Notes (Signed)
[facility] Emergency Department Provider Note  ____________________________________________  Time seen: Approximately 9:26 PM  I have reviewed the triage vital signs and the nursing notes.   HISTORY  Chief Complaint Morning Sickness    HPI Carla Ingram is a 20 y.o. female G3P2 at approximately 15 weeks estimated gestational age followed by Dr. Tiburcio Pea who presents for evaluation of multiple episodes of nonbloody nonbilious emesis today, gradual onset, constant since onset, initially severe, now mild. The patient reports that her significant other was ill yesterday with vomiting and diarrhea. Today she has been ill with vomiting and one episode of nonbloody diarrhea. She is having some mild epigastric discomfort as well as dysuria but no fevers. No chest pain or difficulty breathing. No vaginal bleeding, no loss of fluid from her vagina. She has not felt any fetal movement yet this pregnancy.   Past Medical History  Diagnosis Date  . Positive pregnancy test   . TMJ (dislocation of temporomandibular joint)   . Psoriasis     Patient Active Problem List   Diagnosis Date Noted  . B12 deficiency 11/01/2015  . Tobacco use disorder 11/01/2015  . 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 Other psoriasis 02/26/2010    Past Surgical History  Procedure Laterality Date  . Cesarean section  [id number]  . Dilation and curettage of uterus  2009    Current Outpatient Rx  Name  Route  Sig  Dispense  Refill  . Cyanocobalamin (VITAMIN B-12) 1000 MCG SUBL   Sublingual   Place 1 tablet (1,000 mcg total) under the tongue daily.   30 tablet   0   . Prenatal Vit-Fe Fumarate-FA (PRENATAL VITAMIN PLUS LOW IRON) 27-1 MG TABS   Oral   Take 1 tablet by mouth daily.   30 tablet   0   . Triamcinolone Acetonide (TRIAMCINOLONE 0.1 % CREAM : EUCERIN) CREA   Topical   Apply 1 application topically 2 (two) times daily as needed.   1 each   0      Allergies Augmentin; Cephalexin; and Penicillins  Family History  Problem Relation Age of Onset  . CVA Mother     Social History Social History  Substance Use Topics  . Smoking status: Current Every Day Smoker -- 0.50 packs/day for 11 years    Types: Cigarettes    Start date: 10/31/2004  . Smokeless tobacco: Never Used  . Alcohol Use: No    Review of Systems Constitutional: No fever/chills Eyes: No visual changes. ENT: No sore throat. Cardiovascular: Denies chest pain. Respiratory: Denies shortness of breath. Gastrointestinal: + abdominal pain.  + nausea, + vomiting.  +diarrhea.  No constipation. Genitourinary: Posittive for dysuria. Musculoskeletal: Negative for back pain. Skin: Negative for rash. Neurological: Negative for headaches, focal weakness or numbness.  10-point ROS otherwise negative.  ____________________________________________   PHYSICAL EXAM:  VITAL SIGNS: ED Triage Vitals  Enc Vitals Group     BP 01/18/16 2033 127/55 mmHg     Pulse Rate 01/18/16 2033 115     Resp 01/18/16 2033 18     Temp --      Temp src --      SpO2 01/18/16 2033 99 %     Weight 01/18/16 2033 180 lb (81.647 kg)     Height 01/18/16 2033  (1.6 m)     Head Cir --      Peak Flow --      Pain Score 01/18/16 2034 10     Pain  Loc --      Pain Edu? --      Excl. in GC? --     Constitutional: Alert and oriented. Well appearing and in no acute distress. Eyes: Conjunctivae are normal. PERRL. EOMI. Head: Atraumatic. Nose: No congestion/rhinnorhea. Mouth/Throat: Mucous membranes are moist.  Oropharynx non-erythematous. Neck: No stridor.   Cardiovascular: Normal rate, regular rhythm. Grossly normal heart sounds.  Good peripheral circulation. Respiratory: Normal respiratory effort.  No retractions. Lungs CTAB. Gastrointestinal: Soft and nontender. Uterine fundus palpated just above the pelvic brim. No CVA tenderness. Genitourinary: Deferred Musculoskeletal: No lower  extremity tenderness nor edema.  No joint effusions. Neurologic:  Normal speech and language. No gross focal neurologic deficits are appreciated. No gait instability. Skin:  Skin is warm, dry and intact. No rash noted. Psychiatric: Mood and affect are normal. Speech and behavior are normal.  ____________________________________________   LABS (all labs ordered are listed, but only abnormal results are displayed)  Labs Reviewed  CBC WITH DIFFERENTIAL/PLATELET - Abnormal; Notable for the following:    Neutro Abs 8.9 (*)    Lymphs Abs 0.8 (*)    All other components within normal limits  COMPREHENSIVE METABOLIC PANEL - Abnormal; Notable for the following:    Potassium 3.2 (*)    CO2 19 (*)    Glucose, Bld 103 (*)    Creatinine, Ser 0.34 (*)    Calcium 8.6 (*)    Albumin 3.4 (*)    AST 14 (*)    ALT 10 (*)    All other components within normal limits  URINALYSIS COMPLETEWITH MICROSCOPIC ([facility] ONLY) - Abnormal; Notable for the following:    Color, Urine YELLOW (*)    APPearance HAZY (*)    Ketones, ur TRACE (*)    Protein, ur 30 (*)    Squamous Epithelial / LPF 6-30 (*)    All other components within normal limits  LIPASE, BLOOD  HCG, QUANTITATIVE, PREGNANCY   ____________________________________________  EKG  none ____________________________________________  RADIOLOGY  Limited emergency department bedside ultrasound performed by me shows single live intrauterine pregnancy with fetal heart rate 167 bpm. ____________________________________________   PROCEDURES  Procedure(s) performed: None  Critical Care performed: No  ____________________________________________   INITIAL IMPRESSION / ASSESSMENT AND PLAN / ED COURSE  Pertinent labs & imaging results that were available during my care of the patient were reviewed by me and considered in my medical decision making (see chart for details).  Carla Ingram is a 20 y.o. female G3P2 at approximately 15 weeks  estimated gestational age followed by Dr. Tiburcio Pea who presents for evaluation of multiple episodes of nonbloody nonbilious emesis today as well as some mild epigastric discomfort. On exam, she is very well-appearing and in no acute distress. Mildly tachycardic on her triage vitals however this resolved by the time I evaluated her and prior to any ER intervention. Her abdominal exam is benign. She reports she feels much better after 1 L of normal saline. She is tolerating by mouth intake. Labs reviewed. CBC and CMP are generally unremarkable with the exception of mild hypokalemia which is chronic for this patient and appears to be at its baseline. Unremarkable lipase. Urinalysis is not consistent with infection. HCG is pending but will not change our management at this time. Reassuring fetal heart tones. Suspect viral illness even sick contacts with similar symptoms. We discussed return precautions, need for close OB/GYN follow-up and she is comfortable with the discharge plan. DC home. ____________________________________________   FINAL CLINICAL IMPRESSION(S) /  ED DIAGNOSES  Final diagnoses:  Vomiting and diarrhea      Gayla Doss, MD 01/18/16 2308

## 2016-01-18 NOTE — ED Notes (Signed)
Pt alert and oriented X4, active, cooperative, pt in NAD. RR even and unlabored, color WNL.  Pt informed to return if any life threatening symptoms occur.   

## 2016-02-14 ENCOUNTER — Emergency Department
Admission: EM | Admit: 2016-02-14 | Discharge: 2016-02-14 | Disposition: A | Discharge status: Home / Self Care | Payer: Medicaid Other | Attending: Emergency Medicine | Admitting: Emergency Medicine

## 2016-02-14 ENCOUNTER — Emergency Department: Payer: Medicaid Other

## 2016-02-14 DIAGNOSIS — S3991XA Unspecified injury of abdomen, initial encounter: Secondary | ICD-10-CM | POA: Diagnosis not present

## 2016-02-14 DIAGNOSIS — W010XXA Fall on same level from slipping, tripping and stumbling without subsequent striking against object, initial encounter: Secondary | ICD-10-CM | POA: Diagnosis not present

## 2016-02-14 DIAGNOSIS — F17210 Nicotine dependence, cigarettes, uncomplicated: Secondary | ICD-10-CM | POA: Insufficient documentation

## 2016-02-14 DIAGNOSIS — O26899 Other specified pregnancy related conditions, unspecified trimester: Secondary | ICD-10-CM

## 2016-02-14 DIAGNOSIS — R109 Unspecified abdominal pain: Secondary | ICD-10-CM

## 2016-02-14 DIAGNOSIS — S3992XA Unspecified injury of lower back, initial encounter: Secondary | ICD-10-CM | POA: Diagnosis not present

## 2016-02-14 DIAGNOSIS — Y92009 Unspecified place in unspecified non-institutional (private) residence as the place of occurrence of the external cause: Secondary | ICD-10-CM | POA: Insufficient documentation

## 2016-02-14 DIAGNOSIS — Z3A20 20 weeks gestation of pregnancy: Secondary | ICD-10-CM | POA: Insufficient documentation

## 2016-02-14 DIAGNOSIS — Y998 Other external cause status: Secondary | ICD-10-CM | POA: Insufficient documentation

## 2016-02-14 DIAGNOSIS — O99332 Smoking (tobacco) complicating pregnancy, second trimester: Secondary | ICD-10-CM | POA: Diagnosis not present

## 2016-02-14 DIAGNOSIS — Y9389 Activity, other specified: Secondary | ICD-10-CM | POA: Diagnosis not present

## 2016-02-14 DIAGNOSIS — Z79899 Other long term (current) drug therapy: Secondary | ICD-10-CM | POA: Insufficient documentation

## 2016-02-14 DIAGNOSIS — O9A212 Injury, poisoning and certain other consequences of external causes complicating pregnancy, second trimester: Secondary | ICD-10-CM | POA: Insufficient documentation

## 2016-02-14 DIAGNOSIS — Z880 Allergy status to penicillin: Secondary | ICD-10-CM | POA: Insufficient documentation

## 2016-02-14 DIAGNOSIS — W19XXXA Unspecified fall, initial encounter: Secondary | ICD-10-CM

## 2016-02-14 LAB — URINALYSIS COMPLETE WITH MICROSCOPIC (ARMC ONLY)
Bilirubin Urine: NEGATIVE
Glucose, UA: NEGATIVE mg/dL
Hgb urine dipstick: NEGATIVE
Ketones, ur: NEGATIVE mg/dL
Leukocytes, UA: NEGATIVE
Nitrite: NEGATIVE
PROTEIN: NEGATIVE mg/dL
RBC / HPF: NONE SEEN RBC/hpf (ref 0–5)
Specific Gravity, Urine: 1.003 — ABNORMAL LOW (ref 1.005–1.030)
pH: 7 (ref 5.0–8.0)

## 2016-02-14 MED ORDER — ACETAMINOPHEN 325 MG PO TABS
650.0000 mg | ORAL_TABLET | Freq: Once | ORAL | Status: AC
  Administered 2016-02-14: 650 mg via ORAL
  Filled 2016-02-14: qty 2

## 2016-02-14 NOTE — ED Notes (Signed)
Pt arrived to ED with c/o abdominal cramping after fall 1 hour ago. Pt reports [redacted] weeks pregnant. Pt c/o pressure and cramping. Pt denies vaginal bleeding.

## 2016-02-14 NOTE — ED Provider Notes (Signed)
[facility] Emergency Department Provider Note  ____________________________________________  Time seen: Approximately 323 AM  I have reviewed the triage vital signs and the nursing notes.   HISTORY  Chief Complaint Abdominal Cramping    HPI Carla Ingram is a 20 y.o. female who comes into the hospital today with abdominal cramping. The patient is 18 weeks 4 days pregnant. She reports that she fell today after tripping and fell on her right side of her stomach. The patient reports that she's had some constant sharp pressure and intermittent pain since then. She's having some lower pressure and cramps in her abdomen and in her back. The patient feels like she is having some occasional stabbing as well. Since the fall she reports that she has felt the baby move but she was concerned for the cramping so she decided to come and get checked out. The patient rates her pain a 7 out of 10 in intensity currently. She did not take anything for the pain she decided to come in to get checked out.   Past Medical History  Diagnosis Date  . Positive pregnancy test   . TMJ (dislocation of temporomandibular joint)   . Psoriasis     Patient Active Problem List   Diagnosis Date Noted  . B12 deficiency 11/01/2015  . Tobacco use disorder 11/01/2015  . 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 Other psoriasis 02/26/2010    Past Surgical History  Procedure Laterality Date  . Cesarean section  [id number]  . Dilation and curettage of uterus  2009    Current Outpatient Rx  Name  Route  Sig  Dispense  Refill  . Cyanocobalamin (VITAMIN B-12) 1000 MCG SUBL   Sublingual   Place 1 tablet (1,000 mcg total) under the tongue daily.   30 tablet   0   . Prenatal Vit-Fe Fumarate-FA (PRENATAL VITAMIN PLUS LOW IRON) 27-1 MG TABS   Oral   Take 1 tablet by mouth daily.   30 tablet   0   . Triamcinolone Acetonide (TRIAMCINOLONE 0.1 % CREAM : EUCERIN) CREA   Topical   Apply  1 application topically 2 (two) times daily as needed.   1 each   0     Allergies Augmentin; Cephalexin; and Penicillins  Family History  Problem Relation Age of Onset  . CVA Mother     Social History Social History  Substance Use Topics  . Smoking status: Current Every Day Smoker -- 0.50 packs/day for 11 years    Types: Cigarettes    Start date: 10/31/2004  . Smokeless tobacco: Never Used  . Alcohol Use: No    Review of Systems Constitutional: No fever/chills Eyes: No visual changes. ENT: No sore throat. Cardiovascular: Denies chest pain. Respiratory: Denies shortness of breath. Gastrointestinal:  abdominal pain and cramping  No nausea, no vomiting.  No diarrhea.  No constipation. Genitourinary: no vaginal bleeding or discharge Musculoskeletal:  back pain. Skin: Negative for rash. Neurological: Negative for headaches, focal weakness or numbness.  10-point ROS otherwise negative.  ____________________________________________   PHYSICAL EXAM:  VITAL SIGNS: ED Triage Vitals  Enc Vitals Group     BP 02/14/16 0005 128/68 mmHg     Pulse Rate 02/14/16 0005 112     Resp 02/14/16 0005 18     Temp 02/14/16 0005 97.9 F (36.6 C)     Temp Source 02/14/16 0005 Oral     SpO2 02/14/16 0005 98 %     Weight 02/14/16 0005 180 lb (81.647  kg)     Height 02/14/16 0005  (1.549 m)     Head Cir --      Peak Flow --      Pain Score 02/14/16 0006 8     Pain Loc --      Pain Edu? --      Excl. in GC? --     Constitutional: Alert and oriented. Well appearing and in no acute distress. Eyes: Conjunctivae are normal. PERRL. EOMI. Head: Atraumatic. Nose: No congestion/rhinnorhea. Mouth/Throat: Mucous membranes are moist.  Oropharynx non-erythematous. Cardiovascular: Normal rate, regular rhythm. Grossly normal heart sounds.  Good peripheral circulation. Respiratory: Normal respiratory effort.  No retractions. Lungs CTAB. Gastrointestinal: gravid abdomen with mild diffuse  abd pain. No distention. Positive bowel sounds Musculoskeletal: No lower extremity tenderness nor edema.   Neurologic:  Normal speech and language.  Skin:  Skin is warm, dry and intact.  Psychiatric: Mood and affect are normal.   ____________________________________________   LABS (all labs ordered are listed, but only abnormal results are displayed)  Labs Reviewed  URINALYSIS COMPLETEWITH MICROSCOPIC ([facility] ONLY) - Abnormal; Notable for the following:    Color, Urine STRAW (*)    APPearance HAZY (*)    Specific Gravity, Urine 1.003 (*)    Bacteria, UA RARE (*)    Squamous Epithelial / LPF 6-30 (*)    All other components within normal limits   ____________________________________________  EKG  none ____________________________________________  RADIOLOGY  US OB limited: single live intrauterine pregnancy noted with BPD of 4.5 cm corresponding to a gestational age of [redacted] weeks and 4 days. The cervix remains close, no evidence of placenta previa. Heart rate 133. Movement yes. ____________________________________________   PROCEDURES  Procedure(s) performed: None  Critical Care performed: No  ____________________________________________   INITIAL IMPRESSION / ASSESSMENT AND PLAN / ED COURSE  Pertinent labs & imaging results that were available during my care of the patient were reviewed by me and considered in my medical decision making (see chart for details).  This is a 20 year old female who comes in today with some abdominal cramping after having a fall onto her right side of her stomach. The patient's ultrasound is unremarkable. Since the patient is less than 20 weeks or unable to send her to L&D triage by did contact Dr. Vergie Living the OB/GYN on-call. He felt that if the patient is feeling the baby move and is not having any vaginal bleeding that she did not need to be monitored emergently. He feels that the patient can of follow-up with her OB appointment and she has  scheduled next. I discussed this with the patient and I did give her some Tylenol. The patient will be discharged home to follow-up with [facility]. According to obese document she is type O+. ____________________________________________   FINAL CLINICAL IMPRESSION(S) / ED DIAGNOSES  Final diagnoses:  Abdominal cramping  Fall, initial encounter  Abdominal pain in pregnancy      Rebecka Apley, MD 02/14/16 [phone]

## 2016-02-14 NOTE — ED Notes (Signed)
Pt 18weeks peg, had fall at home onto bed frame. States right side abdomen pain, denies any bleeding or LOC. States she "just wants to make sure baby is ok."

## 2016-02-14 NOTE — Discharge Instructions (Signed)
What Do I Need to Know About Injuries During Pregnancy? Injuries can happen during pregnancy. Minor falls and accidents usually do not harm you or your baby. However, any injury should be reported to your doctor. WHAT CAN I DO TO PROTECT MYSELF FROM INJURIES?  Remove rugs and loose objects on the floor.  Wear comfortable shoes that have a good grip. Do not wear high-heeled shoes.  Always wear your seat belt. The lap belt should be below your belly. Always practice safe driving.  Do not ride on a motorcycle.  Do not participate in high-impact activities or sports.  Avoid:  Walking on wet or slippery floors.  Fires.  Starting fires.  Lifting heavy pots of boiling or hot liquids.  Fixing electrical problems.  Only take medicine as told by your doctor.  Know your blood type and the blood type of the baby's father.  Call your local emergency services (911 in the [place] if you are a victim of domestic violence or assault. For help and support, contact the [employer]. WHEN SHOULD I GET HELP RIGHT AWAY?  You fall on your belly or have any high-impact accident or injury.  You have been a victim of domestic violence or any kind of violence.  You have been in a car accident.  You have bleeding from your vagina.  Fluid is leaking from your vagina.  You start to have belly cramping (contractions) or pain.  You feel weak or pass out (faint).  You start to throw up (vomit) after an injury.  You have been burned.  You have a stiff neck or neck pain.  You get a headache or have vision problems after an injury.  You do not feel the baby move or the baby is not moving as much as normal.   This information is not intended to replace advice given to you by your health care provider. Make sure you discuss any questions you have with your health care provider.   Document Released: 01/11/2011 Document Revised: 12/30/2014 Document Reviewed:  09/15/2013 Elsevier Interactive Patient Education 2016 Else[employer] Inc[employer]  Abdominal Pain, Adult Many things can cause abdominal pain. Usually, abdominal pain is not caused by a disease and will improve without treatment. It can often be observed and treated at home. Your health care provider will do a physical exam and possibly order blood tests and X-rays to help determine the seriousness of your pain. However, in many cases, more time must pass before a clear cause of the pain can be found. Before that point, your health care provider may not know if you need more testing or further treatment. HOME CARE INSTRUCTIONS Monitor your abdominal pain for any changes. The following actions may help to alleviate any discomfort you are experiencing:  Only take over-the-counter or prescription medicines as directed by your health care provider.  Do not take laxatives unless directed to do so by your health care provider.  Try a clear liquid diet (broth, tea, or water) as directed by your health care provider. Slowly move to a bland diet as tolerated. SEEK MEDICAL CARE IF:  You have unexplained abdominal pain.  You have abdominal pain associated with nausea or diarrhea.  You have pain when you urinate or have a bowel movement.  You experience abdominal pain that wakes you in the night.  You have abdominal pain that is worsened or improved by eating food.  You have abdominal pain that is worsened with eating fatty foods.  You have a  fever. SEEK IMMEDIATE MEDICAL CARE IF:  Your pain does not go away within 2 hours.  You keep throwing up (vomiting).  Your pain is felt only in portions of the abdomen, such as the right side or the left lower portion of the abdomen.  You pass bloody or black tarry stools. MAKE SURE YOU:  Understand these instructions.  Will watch your condition.  Will get help right away if you are not doing well or get worse.   This information is not intended to replace  advice given to you by your health care provider. Make sure you discuss any questions you have with your health care provider.   Document Released: 09/18/2005 Document Revised: 08/30/2015 Document Reviewed: 08/18/2013 Elsevier Interactive Patient Education [employer]

## 2016-05-06 ENCOUNTER — Observation Stay
Admission: EM | Admit: 2016-05-06 | Discharge: 2016-05-06 | Disposition: A | Discharge status: Home / Self Care | Payer: Medicaid Other | Attending: Certified Nurse Midwife | Admitting: Certified Nurse Midwife

## 2016-05-06 DIAGNOSIS — O34219 Maternal care for unspecified type scar from previous cesarean delivery: Secondary | ICD-10-CM | POA: Insufficient documentation

## 2016-05-06 DIAGNOSIS — O26899 Other specified pregnancy related conditions, unspecified trimester: Secondary | ICD-10-CM

## 2016-05-06 DIAGNOSIS — O9989 Other specified diseases and conditions complicating pregnancy, childbirth and the puerperium: Principal | ICD-10-CM | POA: Insufficient documentation

## 2016-05-06 DIAGNOSIS — R102 Pelvic and perineal pain: Secondary | ICD-10-CM | POA: Insufficient documentation

## 2016-05-06 DIAGNOSIS — R109 Unspecified abdominal pain: Secondary | ICD-10-CM

## 2016-05-06 DIAGNOSIS — Z3A30 30 weeks gestation of pregnancy: Secondary | ICD-10-CM | POA: Diagnosis not present

## 2016-05-06 HISTORY — DX: Premature separation of placenta, unspecified, unspecified trimester: O45.90

## 2016-05-06 MED ORDER — ACETAMINOPHEN 500 MG PO TABS
ORAL_TABLET | ORAL | Status: AC
  Administered 2016-05-06: 1000 mg via ORAL
  Filled 2016-05-06: qty 2

## 2016-05-06 MED ORDER — ACETAMINOPHEN 500 MG PO TABS
1000.0000 mg | ORAL_TABLET | Freq: Four times a day (QID) | ORAL | Status: DC | PRN
  Administered 2016-05-06: 1000 mg via ORAL

## 2016-05-06 NOTE — Discharge Instructions (Signed)
May take Tylenol in 6-8 hours from 11:15p if needed. Preterm labor precautions and s/s of placental abruption discussed with pt. Understanding verbalized.

## 2016-05-06 NOTE — OB Triage Note (Signed)
Pt arrived to triage with c/o abdominal pain "all over" says it felt like it did when she had a placental abruption with her first child. 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 Pt denies any abdominal pain prior to 9a, but since this afternoon when she got home, having nausea, back and abdominal pain which continues to worsen, rates 8/10, constant, sharp and pressure. Pt says she has not felt baby move much in the past couple hours. Denies spotting, vag bleeding, leaking fluid, unusual discharge, urinary sym, or vomiting. Denies taking any med or doing anything to relieve pain.

## 2016-05-06 NOTE — Final Progress Note (Signed)
Physician Final Progress Note  Patient ID: Carla DykesJamy K Harbeck MRN: [id number] DOB/AGE: July 21, 1996 20 y.o.  Admit date: 05/06/2016 Admitting provider: Hays Bingharlie Pickens, MD Discharge date: 05/06/2016   Admission Diagnoses: abdominal pain in pregnancy IUP at 30 wk2d  Discharge Diagnoses: Round ligament pain  Consults: none  Significant Findings/ Diagnostic Studies: 20 year old G3 38P1102 with EDC=07/13/2016 presents to L&D at 30wk2 day with c/o abdominal pain " like when I had my abruption". The pain began after she spent 5 hours working out in the yard and picking up trash. No bleeding, no trauma, no falls. Has some tightening ( but <6x/hr) and pelvic pressure.Hx of CS x2. In 2012 had abruption and preterm labor at 35 weeks and in 2015 had a repeat cesarean section at term. No dysuria. Baby active Exam BP 103/55 mmHg  Pulse 90  Temp(Src) 97.4 F (36.3 C) (Axillary)  Resp 18  Ht 5\' 1"  (1.549 m)  Wt 83.462 kg (184 lb)  BMI 34.78 kg/m2  LMP 09/05/2015  General; gravid WF in NAD, uncomfortable when changing position in bed ABD: tenderness over left and right round ligaments Ultrasound: cephalic/ fetal movement and fetal breathing movement seen Patient grimaces when baby kicks her around her umbilicus. Placenta anterior and appears grossly normal FHR: 130's baseline with accels to 150s and moderate variability, no decelerations Toco: essentially acontractile, no contractions palpated Cervix: L/T/C/ballots out of pelvis A: Round ligament pain following physical activity/work P: Tylenol/ heat/ warm bath/ maternity support belt/ avoid strenuous exercise/lifting etc FU this week at office as scheduled and prn                Procedures: None  Discharge Condition: stable  Disposition: 01-Home or Self Care  Diet: Regular diet  Discharge Activity: No strenuous activity or heavy lifting     Medication List    ASK your doctor about these medications        PRENATAL VITAMIN PLUS LOW IRON  27-1 MG Tabs  Take 1 tablet by mouth daily.         Total time spent taking care of this patient: 20 minutes  Signed: Laramie Gelles 05/06/2016, 11:13 PM

## 2016-06-12 ENCOUNTER — Observation Stay
Admission: EM | Admit: 2016-06-12 | Discharge: 2016-06-13 | Disposition: A | Discharge status: Home / Self Care | Payer: Medicaid Other | Attending: Advanced Practice Midwife | Admitting: Advanced Practice Midwife

## 2016-06-12 DIAGNOSIS — O9989 Other specified diseases and conditions complicating pregnancy, childbirth and the puerperium: Principal | ICD-10-CM | POA: Insufficient documentation

## 2016-06-12 DIAGNOSIS — R102 Pelvic and perineal pain: Secondary | ICD-10-CM | POA: Diagnosis not present

## 2016-06-12 DIAGNOSIS — Z3A35 35 weeks gestation of pregnancy: Secondary | ICD-10-CM | POA: Diagnosis not present

## 2016-06-12 LAB — URINALYSIS COMPLETE WITH MICROSCOPIC (ARMC ONLY)
Bilirubin Urine: NEGATIVE
Glucose, UA: NEGATIVE mg/dL
HGB URINE DIPSTICK: NEGATIVE
Ketones, ur: NEGATIVE mg/dL
Nitrite: POSITIVE — AB
PH: 7 (ref 5.0–8.0)
Protein, ur: NEGATIVE mg/dL
SPECIFIC GRAVITY, URINE: 1.009 (ref 1.005–1.030)

## 2016-06-12 NOTE — Progress Notes (Signed)
Pt presents to L&D with c/o pressure and cramping since 8pm. Reports good fetal movement, denies bleeding and leakage of fluid but does report a gush of fluid 3 days ago, that  was a one time occurrence. Pt has h/o c/s x 2 Admits to smoking 1/2-1ppd of cigarettes, and also drinking mostly mountain dew during the day. States she has has one cup of water today. EFM applied and explained plan to monitor fetal and matermal well being and assess pt complaint, water given and plan to call CNM with report,

## 2016-06-12 NOTE — OB Triage Note (Signed)
Pt presents to L&D with pelvic pressure and cramping that began at  2000 this evening. Pt states, "feels like my pelvis is going to break apart.".

## 2016-06-13 DIAGNOSIS — O9989 Other specified diseases and conditions complicating pregnancy, childbirth and the puerperium: Secondary | ICD-10-CM | POA: Diagnosis not present

## 2016-06-13 NOTE — Discharge Instructions (Signed)
Call provider or return to [facility]place with: ? ?1. Regular contractions ?2. Leaking of fluid from your vagina ?3. Vaginal bleeding: Bright red or heavy like a period ?4. Decreased Fetal movement  ?

## 2016-06-13 NOTE — Discharge Summary (Signed)
Physician Discharge Summary  Patient ID: Carla DykesJamy K Barretta MRN: [id number] DOB/AGE: 1996-03-06 20 y.o.  Admit date: 06/12/2016 Discharge date: 06/13/2016  Admission Diagnoses: 20 year old G3P1102 with IUP at 7838w5d with complaint of pelvic pressure  Discharge Diagnoses:  Active Problems:   Labor and delivery indication for care or intervention IUP at 1938w5d with reactive NST, no cervical change  Discharged Condition: good  Hospital Course: pt admitted to observation, placed on monitors, cervical exam  Consults: None  Significant Diagnostic Studies: none  Treatments: none  Discharge Exam: Blood pressure 116/56, pulse 90, temperature 98.2 F (36.8 C), temperature source Oral, resp. rate 18, height 5\' 3"  (1.6 m), weight 86.183 kg (190 lb), last menstrual period 09/05/2015. General appearance: alert, cooperative, appears stated age and no distress  Cervix: on admission: FT/60/-3, on discharge: FT/60/-3 Toco: occasional ctx Fetal Well Being: 125 bpm baseline, moderate variability, + accelerations, - decelerations  Disposition: home      Discharge Instructions    Discharge activity:  No Restrictions    Complete by:  As directed      Discharge diet:  No restrictions    Complete by:  As directed      Fetal Kick Count:  Lie on our left side for one hour after a meal, and count the number of times your baby kicks.  If it is less than 5 times, get up, move around and drink some juice.  Repeat the test 30 minutes later.  If it is still less than 5 kicks in an hour, notify your doctor.    Complete by:  As directed      No sexual activity restrictions    Complete by:  As directed      Notify physician for a general feeling that "something is not right"    Complete by:  As directed      Notify physician for increase or change in vaginal discharge    Complete by:  As directed      Notify physician for intestinal cramps, with or without diarrhea, sometimes described as "gas pain"     Complete by:  As directed      Notify physician for leaking of fluid    Complete by:  As directed      Notify physician for low, dull backache, unrelieved by heat or Tylenol    Complete by:  As directed      Notify physician for menstrual like cramps    Complete by:  As directed      Notify physician for pelvic pressure    Complete by:  As directed      Notify physician for uterine contractions.  These may be painless and feel like the uterus is tightening or the baby is  "balling up"    Complete by:  As directed      Notify physician for vaginal bleeding    Complete by:  As directed      PRETERM LABOR:  Includes any of the follwing symptoms that occur between 20 - [redacted] weeks gestation.  If these symptoms are not stopped, preterm labor can result in preterm delivery, placing your baby at risk    Complete by:  As directed             Medication List    TAKE these medications        PRENATAL VITAMIN PLUS LOW IRON 27-1 MG Tabs  Take 1 tablet by mouth daily.     ranitidine 150 MG tablet  Commonly known as:  ZANTAC  Take 150 mg by mouth 2 (two) times daily as needed for heartburn.       Follow-up Information    Go to to follow up.   Why:  regular scheduled prenatal appointment      Signed: Tresea MallGLEDHILL,Celine Dishman, CNM

## 2016-06-20 ENCOUNTER — Observation Stay
Admission: EM | Admit: 2016-06-20 | Discharge: 2016-06-20 | Disposition: A | Discharge status: Home / Self Care | Payer: Medicaid Other | Attending: Obstetrics & Gynecology | Admitting: Obstetrics & Gynecology

## 2016-06-20 DIAGNOSIS — O471 False labor at or after 37 completed weeks of gestation: Principal | ICD-10-CM | POA: Insufficient documentation

## 2016-06-20 DIAGNOSIS — Z3A37 37 weeks gestation of pregnancy: Secondary | ICD-10-CM | POA: Diagnosis not present

## 2016-06-20 MED ORDER — ACETAMINOPHEN 325 MG PO TABS: 650.0000 mg | ORAL_TABLET | ORAL | Status: DC | PRN

## 2016-06-20 NOTE — Discharge Instructions (Signed)
Keep and [facility]. Drink plenty of fluids and stay hydrated.   Braxton Hicks Contractions Contractions of the uterus can occur throughout pregnancy. Contractions are not always a sign that you are in labor.  WHAT ARE BRAXTON HICKS CONTRACTIONS?  Contractions that occur before labor are called Braxton Hicks contractions, or false labor. Toward the end of pregnancy (32-34 weeks), these contractions can develop more often and may become more forceful. This is not true labor because these contractions do not result in opening (dilatation) and thinning of the cervix. They are sometimes difficult to tell apart from true labor because these contractions can be forceful and people have different pain tolerances. You should not feel embarrassed if you go to the hospital with false labor. Sometimes, the only way to tell if you are in true labor is for your health care provider to look for changes in the cervix. If there are no prenatal problems or other health problems associated with the pregnancy, it is completely safe to be sent home with false labor and await the onset of true labor. HOW CAN YOU TELL THE DIFFERENCE BETWEEN TRUE AND FALSE LABOR? False Labor  The contractions of false labor are usually shorter and not as hard as those of true labor.   The contractions are usually irregular.   The contractions are often felt in the front of the lower abdomen and in the groin.   The contractions may go away when you walk around or change positions while lying down.   The contractions get weaker and are shorter lasting as time goes on.   The contractions do not usually become progressively stronger, regular, and closer together as with true labor.  True Labor  Contractions in true labor last 30-70 seconds, become very regular, usually become more intense, and increase in frequency.   The contractions do not go away with walking.   The discomfort is  usually felt in the top of the uterus and spreads to the lower abdomen and low back.   True labor can be determined by your health care provider with an exam. This will show that the cervix is dilating and getting thinner.  WHAT TO REMEMBER  Keep up with your usual exercises and follow other instructions given by your health care provider.   Take medicines as directed by your health care provider.   Keep your regular prenatal appointments.   Eat and drink lightly if you think you are going into labor.   If Braxton Hicks contractions are making you uncomfortable:   Change your position from lying down or resting to walking, or from walking to resting.   Sit and rest in a tub of warm water.   Drink 2-3 glasses of water. Dehydration may cause these contractions.   Do slow and deep breathing several times an hour.  WHEN SHOULD I SEEK IMMEDIATE MEDICAL CARE? Seek immediate medical care if:  Your contractions become stronger, more regular, and closer together.   You have fluid leaking or gushing from your vagina.   You have a fever.   You pass blood-tinged mucus.   You have vaginal bleeding.   You have continuous abdominal pain.   You have low back pain that you never had before.   You feel your baby's head pushing down and causing pelvic pressure.   Your baby is not moving as much as it used to.    This information is not intended to replace advice given to  you by your health care provider. Make sure you discuss any questions you have with your health care provider.   Document Released: 12/09/2005 Document Revised: 12/14/2013 Document Reviewed: 09/20/2013 Elsevier Interactive Patient Education [employer]2016 Elsevier Inc.

## 2016-06-20 NOTE — OB Triage Note (Signed)
Ms. Carla NewtonJamy Ingram, [place], came in today because of contractions. Patient states she has felt contractions since 7am this morning. She states feeling pressure and pain at a number 10 on a scale of 10.

## 2016-06-20 NOTE — Discharge Summary (Signed)
Patient presented for evaluation of labor.  Patient had cervical exam by RN and this was reported to me. I reviewed her vital signs and fetal tracing, both of which were reassuring.  Patient was discharged as she was not laboring.  NST interpretation: Reactive.  Glennis Borger, MD Attending Obstetrician and Gynecologist [facility]   

## 2016-06-20 NOTE — OB Triage Note (Signed)
Ms. Carla NewtonJamy Ingram, [id number], was discharged home in stable conditions. Patient verbalized understanding of discharge instructions and will follow up with prenatal appt. at [facility] tomorrowm June 21, 2016.

## 2016-06-21 LAB — OB RESULTS CONSOLE GBS: GBS: POSITIVE

## 2016-07-05 ENCOUNTER — Observation Stay
Admission: EM | Admit: 2016-07-05 | Discharge: 2016-07-05 | Disposition: A | Discharge status: Home / Self Care | Payer: Medicaid Other | Attending: Obstetrics and Gynecology | Admitting: Obstetrics and Gynecology

## 2016-07-05 DIAGNOSIS — Z3A38 38 weeks gestation of pregnancy: Secondary | ICD-10-CM | POA: Insufficient documentation

## 2016-07-05 DIAGNOSIS — O4693 Antepartum hemorrhage, unspecified, third trimester: Secondary | ICD-10-CM | POA: Diagnosis present

## 2016-07-05 DIAGNOSIS — O34219 Maternal care for unspecified type scar from previous cesarean delivery: Secondary | ICD-10-CM | POA: Diagnosis not present

## 2016-07-05 DIAGNOSIS — O4690 Antepartum hemorrhage, unspecified, unspecified trimester: Secondary | ICD-10-CM | POA: Diagnosis present

## 2016-07-05 MED ORDER — ACETAMINOPHEN 325 MG PO TABS: 650.0000 mg | ORAL_TABLET | ORAL | Status: DC | PRN

## 2016-07-05 NOTE — Final Progress Note (Signed)
Physician Final Progress Note  Patient ID: Carla Ingram MRN: [id number] DOB/AGE: 1996-10-18 20 y.o.  Admit date: 07/05/2016 Admitting provider: Conard NovakStephen D Kyonna Frier, MD Discharge date: 07/05/2016   Admission Diagnoses:  1) intrauterine pregnancy at 8475w6d  2) history of prior cesarean delivery 3) vaginal bleeding  Discharge Diagnoses:  1) intrauterine pregnancy at 3375w6d  2) history of prior cesarean delivery 3) vaginal bleeding  Consults: None  Significant Findings/ Diagnostic Studies: None  Procedures: NST Baseline: 120 bpm Variability: moderate Accels: present Decels: absent TOCO: occasional contractions  HPI: patient presents for vaginal bleeding after cervical check in the office today.  States the bleeding was like spotting on a pad.  Her bleeding has tapered to nothing while on L&D.  She denies contractions. She notes +FM. Denies LOF.   Discharge Condition: stable  Disposition: 01-Home or Self Care  Diet: Regular diet  Discharge Activity: Activity as tolerated     Medication List    TAKE these medications        PRENATAL VITAMIN PLUS LOW IRON 27-1 MG Tabs  Take 1 tablet by mouth daily.     ranitidine 150 MG tablet  Commonly known as:  ZANTAC  Take 150 mg by mouth 2 (two) times daily as needed for heartburn.         Total time spent taking care of this patient: 20 minutes  Signed: Conard NovakJackson, Dailyn Reith D 07/05/2016, 10:21 PM

## 2016-07-05 NOTE — Plan of Care (Signed)
Pt presents to l/d with c/o vaginal bleeding after exam at office today. Pt did not wear pad in. States the bleeding has gotten less after a few hours at home. No blood noted on perineum.

## 2016-07-05 NOTE — Progress Notes (Signed)
No blood noted on peri pad. Pt seen by dr Jean Rosenthaljackson. Fetal monitor tracing reactive. Ok for pt to be discharged home. Pt d/c home with d/c instructions.

## 2016-07-06 ENCOUNTER — Telehealth: Payer: Self-pay | Admitting: *Deleted

## 2016-07-08 ENCOUNTER — Encounter
Admission: RE | Admit: 2016-07-08 | Discharge: 2016-07-08 | Disposition: A | Discharge status: Home / Self Care | Payer: Medicaid Other | Source: Ambulatory Visit | Attending: Obstetrics & Gynecology | Admitting: Obstetrics & Gynecology

## 2016-07-08 HISTORY — DX: Gastro-esophageal reflux disease without esophagitis: K21.9

## 2016-07-08 HISTORY — DX: Anemia, unspecified: D64.9

## 2016-07-08 LAB — DIFFERENTIAL
Basophils Absolute: 0 10*3/uL (ref 0–0.1)
Basophils Relative: 0 %
EOS ABS: 0.1 10*3/uL (ref 0–0.7)
EOS PCT: 1 %
LYMPHS ABS: 3.1 10*3/uL (ref 1.0–3.6)
Lymphocytes Relative: 25 %
MONOS PCT: 9 %
Monocytes Absolute: 1.1 10*3/uL — ABNORMAL HIGH (ref 0.2–0.9)
Neutro Abs: 8.4 10*3/uL — ABNORMAL HIGH (ref 1.4–6.5)
Neutrophils Relative %: 65 %

## 2016-07-08 LAB — TYPE AND SCREEN
ABO/RH(D): O POS
Antibody Screen: NEGATIVE
Extend sample reason: UNDETERMINED

## 2016-07-08 LAB — CBC
HEMATOCRIT: 32.8 % — AB (ref 35.0–47.0)
HEMOGLOBIN: 11.7 g/dL — AB (ref 12.0–16.0)
MCH: 32.6 pg (ref 26.0–34.0)
MCHC: 35.7 g/dL (ref 32.0–36.0)
MCV: 91.4 fL (ref 80.0–100.0)
Platelets: 405 10*3/uL (ref 150–440)
RBC: 3.58 MIL/uL — ABNORMAL LOW (ref 3.80–5.20)
RDW: 13.9 % (ref 11.5–14.5)
WBC: 12.7 10*3/uL — ABNORMAL HIGH (ref 3.6–11.0)

## 2016-07-08 NOTE — Patient Instructions (Signed)
Your procedure is scheduled on: TOMORROW 07/09/16 Report to EMERGENCY DEPT AT 5:30 AM   Remember: Instructions that are not followed completely may result in serious medical risk, up to and including death, or upon the discretion of your surgeon and anesthesiologist your surgery may need to be rescheduled.    __X__ 1. Do not eat food or drink liquids after midnight. No gum chewing or hard candies.     __X__ 2. No Alcohol for 24 hours before or after surgery.   ____ 3. Bring all medications with you on the day of surgery if instructed.    __X__ 4. Notify your doctor if there is any change in your medical condition     (cold, fever, infections).     Do not wear jewelry, make-up, hairpins, clips or nail polish.  Do not wear lotions, powders, or perfumes.   Do not shave 48 hours prior to surgery. Men may shave face and neck.  Do not bring valuables to the hospital.    [facility] not [facility] for any belongings or valuables.               Contacts, dentures or bridgework may not be worn into surgery.  Leave your suitcase in the car. After surgery it may be brought to your room.  For patients admitted to the hospital, discharge time is determined by your                treatment team.   Patients discharged the day of surgery will not be allowed to drive home.   Please read over the following fact sheets that you were given:   Surgical Site Infection Prevention   ____ Take these medicines the morning of surgery with A SIP OF WATER:    1. NONE  2.   3.   4.  5.  6.  ____ Fleet Enema (as directed)   __X__ Use CHG Soap as directed SAGE WIPES  ____ Use inhalers on the day of surgery  ____ Stop metformin 2 days prior to surgery    ____ Take 1/2 of usual insulin dose the night before surgery and none on the morning of surgery.   ____ Stop Coumadin/Plavix/aspirin on   ____ Stop Anti-inflammatories on    ____ Stop supplements until after surgery.    ____ Bring C-Pap to  the hospital.

## 2016-07-09 ENCOUNTER — Inpatient Hospital Stay: Payer: Medicaid Other | Admitting: Anesthesiology

## 2016-07-09 ENCOUNTER — Inpatient Hospital Stay
Admission: RE | Admit: 2016-07-09 | Discharge: 2016-07-11 | DRG: 765 | Disposition: A | Discharge status: Home / Self Care | Payer: Medicaid Other | Source: Ambulatory Visit | Attending: Obstetrics & Gynecology | Admitting: Obstetrics & Gynecology

## 2016-07-09 ENCOUNTER — Encounter
Admission: RE | Disposition: A | Discharge status: Home / Self Care | Payer: Self-pay | Source: Ambulatory Visit | Attending: Obstetrics & Gynecology

## 2016-07-09 DIAGNOSIS — F17200 Nicotine dependence, unspecified, uncomplicated: Secondary | ICD-10-CM | POA: Diagnosis present

## 2016-07-09 DIAGNOSIS — Z3A39 39 weeks gestation of pregnancy: Secondary | ICD-10-CM

## 2016-07-09 DIAGNOSIS — K660 Peritoneal adhesions (postprocedural) (postinfection): Secondary | ICD-10-CM | POA: Diagnosis present

## 2016-07-09 DIAGNOSIS — O99333 Smoking (tobacco) complicating pregnancy, third trimester: Secondary | ICD-10-CM | POA: Diagnosis present

## 2016-07-09 DIAGNOSIS — O34211 Maternal care for low transverse scar from previous cesarean delivery: Secondary | ICD-10-CM | POA: Diagnosis present

## 2016-07-09 DIAGNOSIS — D62 Acute posthemorrhagic anemia: Secondary | ICD-10-CM | POA: Diagnosis present

## 2016-07-09 DIAGNOSIS — O9081 Anemia of the puerperium: Secondary | ICD-10-CM | POA: Diagnosis present

## 2016-07-09 DIAGNOSIS — Z3490 Encounter for supervision of normal pregnancy, unspecified, unspecified trimester: Secondary | ICD-10-CM

## 2016-07-09 DIAGNOSIS — Z98891 History of uterine scar from previous surgery: Secondary | ICD-10-CM

## 2016-07-09 LAB — RPR: RPR: NONREACTIVE

## 2016-07-09 LAB — HIV ANTIBODY (ROUTINE TESTING W REFLEX): HIV SCREEN 4TH GENERATION: NONREACTIVE

## 2016-07-09 LAB — CHLAMYDIA/NGC RT PCR (ARMC ONLY)
CHLAMYDIA TR: NOT DETECTED
N GONORRHOEAE: NOT DETECTED

## 2016-07-09 SURGERY — Surgical Case
Anesthesia: Spinal | Wound class: Clean Contaminated

## 2016-07-09 MED ORDER — FENTANYL CITRATE (PF) 100 MCG/2ML IJ SOLN: 25.0000 ug | INTRAMUSCULAR | Status: DC | PRN

## 2016-07-09 MED ORDER — NALBUPHINE HCL 10 MG/ML IJ SOLN: 5.0000 mg | INTRAMUSCULAR | Status: DC | PRN

## 2016-07-09 MED ORDER — MORPHINE SULFATE (PF) 0.5 MG/ML IJ SOLN
INTRAMUSCULAR | Status: DC | PRN
  Administered 2016-07-09: .2 mg via EPIDURAL

## 2016-07-09 MED ORDER — DIPHENHYDRAMINE HCL 25 MG PO CAPS: 25.0000 mg | ORAL_CAPSULE | Freq: Four times a day (QID) | ORAL | Status: DC | PRN

## 2016-07-09 MED ORDER — NALOXONE HCL 0.4 MG/ML IJ SOLN: 0.4000 mg | INTRAMUSCULAR | Status: DC | PRN

## 2016-07-09 MED ORDER — DIPHENHYDRAMINE HCL 25 MG PO CAPS: 25.0000 mg | ORAL_CAPSULE | ORAL | Status: DC | PRN

## 2016-07-09 MED ORDER — SODIUM CHLORIDE 0.9% FLUSH: 3.0000 mL | INTRAVENOUS | Status: DC | PRN

## 2016-07-09 MED ORDER — EPHEDRINE SULFATE 50 MG/ML IJ SOLN
INTRAMUSCULAR | Status: DC | PRN
Start: 2016-07-09 — End: 2016-07-09
  Administered 2016-07-09: 5 mg via INTRAVENOUS

## 2016-07-09 MED ORDER — SCOPOLAMINE 1 MG/3DAYS TD PT72
1.0000 | MEDICATED_PATCH | Freq: Once | TRANSDERMAL | Status: DC
  Filled 2016-07-09: qty 1

## 2016-07-09 MED ORDER — NALOXONE HCL 2 MG/2ML IJ SOSY
1.0000 ug/kg/h | PREFILLED_SYRINGE | INTRAVENOUS | Status: DC | PRN
  Filled 2016-07-09: qty 2

## 2016-07-09 MED ORDER — ONDANSETRON HCL 4 MG/2ML IJ SOLN: 4.0000 mg | Freq: Once | INTRAMUSCULAR | Status: DC | PRN

## 2016-07-09 MED ORDER — LACTATED RINGERS IV SOLN
1000.0000 mL | INTRAVENOUS | Status: DC
  Administered 2016-07-09: 1000 mL via INTRAVENOUS

## 2016-07-09 MED ORDER — PHENYLEPHRINE 40 MCG/ML (10ML) SYRINGE FOR IV PUSH (FOR BLOOD PRESSURE SUPPORT): PREFILLED_SYRINGE | INTRAVENOUS | Status: DC | PRN

## 2016-07-09 MED ORDER — BUPIVACAINE IN DEXTROSE 0.75-8.25 % IT SOLN
INTRATHECAL | Status: DC | PRN
  Administered 2016-07-09: 1.6 mL via INTRATHECAL

## 2016-07-09 MED ORDER — OXYTOCIN 40 UNITS IN LACTATED RINGERS INFUSION - SIMPLE MED
2.5000 [IU]/h | INTRAVENOUS | Status: AC
  Filled 2016-07-09: qty 1000

## 2016-07-09 MED ORDER — SODIUM CHLORIDE 0.9 % IV SOLN
10000.0000 ug | INTRAVENOUS | Status: DC | PRN
  Administered 2016-07-09: 25 ug/min via INTRAVENOUS

## 2016-07-09 MED ORDER — ONDANSETRON HCL 4 MG/2ML IJ SOLN
INTRAMUSCULAR | Status: DC | PRN
  Administered 2016-07-09: 4 mg via INTRAVENOUS

## 2016-07-09 MED ORDER — SIMETHICONE 80 MG PO CHEW
80.0000 mg | CHEWABLE_TABLET | Freq: Three times a day (TID) | ORAL | Status: DC
  Administered 2016-07-09 – 2016-07-10 (×4): 80 mg via ORAL
  Filled 2016-07-09 (×5): qty 1

## 2016-07-09 MED ORDER — MORPHINE SULFATE (PF) 2 MG/ML IV SOLN: 1.0000 mg | INTRAVENOUS | Status: DC | PRN

## 2016-07-09 MED ORDER — OXYCODONE-ACETAMINOPHEN 5-325 MG PO TABS
1.0000 | ORAL_TABLET | ORAL | Status: DC | PRN
  Administered 2016-07-10 – 2016-07-11 (×2): 1 via ORAL
  Filled 2016-07-09 (×2): qty 1

## 2016-07-09 MED ORDER — OXYTOCIN 40 UNITS IN LACTATED RINGERS INFUSION - SIMPLE MED
INTRAVENOUS | Status: DC | PRN
  Administered 2016-07-09: 1000 mL via INTRAVENOUS

## 2016-07-09 MED ORDER — LACTATED RINGERS IV SOLN
INTRAVENOUS | Status: DC
  Administered 2016-07-09 (×2): via INTRAVENOUS

## 2016-07-09 MED ORDER — ACETAMINOPHEN 325 MG PO TABS: 650.0000 mg | ORAL_TABLET | ORAL | Status: DC | PRN

## 2016-07-09 MED ORDER — ZOLPIDEM TARTRATE 5 MG PO TABS: 5.0000 mg | ORAL_TABLET | Freq: Every evening | ORAL | Status: DC | PRN

## 2016-07-09 MED ORDER — NALBUPHINE HCL 10 MG/ML IJ SOLN: 5.0000 mg | Freq: Once | INTRAMUSCULAR | Status: DC | PRN

## 2016-07-09 MED ORDER — DEXAMETHASONE SODIUM PHOSPHATE 4 MG/ML IJ SOLN
INTRAMUSCULAR | Status: DC | PRN
  Administered 2016-07-09: 10 mg via INTRAVENOUS

## 2016-07-09 MED ORDER — CEFOXITIN SODIUM-DEXTROSE 2-2.2 GM-% IV SOLR (PREMIX): 2.0000 g | Freq: Four times a day (QID) | INTRAVENOUS | Status: DC

## 2016-07-09 MED ORDER — KETOROLAC TROMETHAMINE 30 MG/ML IJ SOLN
30.0000 mg | Freq: Four times a day (QID) | INTRAMUSCULAR | Status: AC
  Administered 2016-07-09 – 2016-07-10 (×4): 30 mg via INTRAVENOUS
  Filled 2016-07-09 (×4): qty 1

## 2016-07-09 MED ORDER — BUPIVACAINE 0.25 % ON-Q PUMP DUAL CATH 400 ML
400.0000 mL | INJECTION | Status: DC
  Filled 2016-07-09: qty 400

## 2016-07-09 MED ORDER — COCONUT OIL OIL: 1.0000 "application " | TOPICAL_OIL | Status: DC | PRN

## 2016-07-09 MED ORDER — CLINDAMYCIN PHOSPHATE 600 MG/50ML IV SOLN
600.0000 mg | Freq: Once | INTRAVENOUS | Status: AC
  Administered 2016-07-09: 600 mg via INTRAVENOUS
  Filled 2016-07-09: qty 50

## 2016-07-09 MED ORDER — CHLORHEXIDINE GLUCONATE CLOTH 2 % EX PADS: 2.0000 | MEDICATED_PAD | Freq: Once | CUTANEOUS | Status: DC

## 2016-07-09 MED ORDER — SOD CITRATE-CITRIC ACID 500-334 MG/5ML PO SOLN
30.0000 mL | Freq: Once | ORAL | Status: AC
  Administered 2016-07-09: 30 mL via ORAL
  Filled 2016-07-09: qty 15

## 2016-07-09 MED ORDER — DIBUCAINE 1 % RE OINT: 1.0000 "application " | TOPICAL_OINTMENT | RECTAL | Status: DC | PRN

## 2016-07-09 MED ORDER — MEPERIDINE HCL 25 MG/ML IJ SOLN: 6.2500 mg | INTRAMUSCULAR | Status: DC | PRN

## 2016-07-09 MED ORDER — IBUPROFEN 600 MG PO TABS
600.0000 mg | ORAL_TABLET | Freq: Four times a day (QID) | ORAL | Status: DC | PRN
  Administered 2016-07-10 – 2016-07-11 (×3): 600 mg via ORAL
  Filled 2016-07-09 (×4): qty 1

## 2016-07-09 MED ORDER — MEDROXYPROGESTERONE ACETATE 150 MG/ML IM SUSP: 150.0000 mg | INTRAMUSCULAR | Status: DC | PRN

## 2016-07-09 MED ORDER — PHENYLEPHRINE HCL 10 MG/ML IJ SOLN
INTRAMUSCULAR | Status: DC | PRN
  Administered 2016-07-09 (×2): 50 ug via INTRAVENOUS

## 2016-07-09 MED ORDER — SENNOSIDES-DOCUSATE SODIUM 8.6-50 MG PO TABS
2.0000 | ORAL_TABLET | ORAL | Status: DC
  Administered 2016-07-09 – 2016-07-10 (×2): 2 via ORAL
  Filled 2016-07-09 (×2): qty 2

## 2016-07-09 MED ORDER — ONDANSETRON HCL 4 MG/2ML IJ SOLN: 4.0000 mg | Freq: Three times a day (TID) | INTRAMUSCULAR | Status: DC | PRN

## 2016-07-09 MED ORDER — WITCH HAZEL-GLYCERIN EX PADS: 1.0000 "application " | MEDICATED_PAD | CUTANEOUS | Status: DC | PRN

## 2016-07-09 MED ORDER — PRENATAL MULTIVITAMIN CH
1.0000 | ORAL_TABLET | Freq: Every day | ORAL | Status: DC
  Administered 2016-07-10: 1 via ORAL
  Filled 2016-07-09: qty 1

## 2016-07-09 MED ORDER — LACTATED RINGERS IV SOLN
INTRAVENOUS | Status: DC
  Administered 2016-07-09: 07:00:00 via INTRAVENOUS

## 2016-07-09 MED ORDER — MENTHOL 3 MG MT LOZG
1.0000 | LOZENGE | OROMUCOSAL | Status: DC | PRN
  Filled 2016-07-09: qty 9

## 2016-07-09 MED ORDER — SIMETHICONE 80 MG PO CHEW
80.0000 mg | CHEWABLE_TABLET | ORAL | Status: DC | PRN
  Administered 2016-07-11: 80 mg via ORAL

## 2016-07-09 MED ORDER — SIMETHICONE 80 MG PO CHEW
80.0000 mg | CHEWABLE_TABLET | ORAL | Status: DC
  Administered 2016-07-09 – 2016-07-10 (×2): 80 mg via ORAL
  Filled 2016-07-09 (×2): qty 1

## 2016-07-09 MED ORDER — BUPIVACAINE HCL 0.25 % IJ SOLN
INTRAMUSCULAR | Status: DC | PRN
  Administered 2016-07-09: 10 mL

## 2016-07-09 MED ORDER — DIPHENHYDRAMINE HCL 50 MG/ML IJ SOLN: 12.5000 mg | INTRAMUSCULAR | Status: DC | PRN

## 2016-07-09 MED ORDER — BUPIVACAINE HCL (PF) 0.5 % IJ SOLN
10.0000 mL | Freq: Once | INTRAMUSCULAR | Status: DC
  Filled 2016-07-09: qty 30

## 2016-07-09 SURGICAL SUPPLY — 22 items
CANISTER SUCT 3000ML (MISCELLANEOUS) ×3 IMPLANT
CATH KIT ON-Q SILVERSOAK 5IN (CATHETERS) ×6 IMPLANT
CHLORAPREP W/TINT 26ML (MISCELLANEOUS) ×6 IMPLANT
DRSG OPSITE POSTOP 4X10 (GAUZE/BANDAGES/DRESSINGS) ×3 IMPLANT
ELECT CAUTERY BLADE 6.4 (BLADE) ×3 IMPLANT
ELECT REM PT RETURN 9FT ADLT (ELECTROSURGICAL) ×3
ELECTRODE REM PT RTRN 9FT ADLT (ELECTROSURGICAL) ×1 IMPLANT
GLOVE SKINSENSE NS SZ8.0 LF (GLOVE) ×2
GLOVE SKINSENSE STRL SZ8.0 LF (GLOVE) ×1 IMPLANT
GOWN STRL REUS W/ TWL LRG LVL3 (GOWN DISPOSABLE) ×1 IMPLANT
GOWN STRL REUS W/ TWL XL LVL3 (GOWN DISPOSABLE) ×2 IMPLANT
GOWN STRL REUS W/TWL LRG LVL3 (GOWN DISPOSABLE) ×2
GOWN STRL REUS W/TWL XL LVL3 (GOWN DISPOSABLE) ×4
LIQUID BAND (GAUZE/BANDAGES/DRESSINGS) ×6 IMPLANT
NS IRRIG 1000ML POUR BTL (IV SOLUTION) ×3 IMPLANT
PACK C SECTION AR (MISCELLANEOUS) ×3 IMPLANT
PAD OB MATERNITY 4.3X12.25 (PERSONAL CARE ITEMS) ×3 IMPLANT
PAD PREP 24X41 OB/GYN DISP (PERSONAL CARE ITEMS) ×3 IMPLANT
SUT MAXON ABS #0 GS21 30IN (SUTURE) ×6 IMPLANT
SUT VIC AB 1 CT1 36 (SUTURE) ×9 IMPLANT
SUT VIC AB 2-0 CT1 36 (SUTURE) ×3 IMPLANT
SUT VIC AB 4-0 FS2 27 (SUTURE) ×3 IMPLANT

## 2016-07-09 NOTE — Discharge Summary (Signed)
Obstetrical Discharge Summary  Date of Admission: 07/09/2016 Date of Discharge: 07/11/2016 Discharge Diagnosis: Term Pregnancy-delivered and Prior Cesarean x2 Primary OB:  [facility]   Gestational Age at Delivery: 1470w3d  Antepartum complications: none Date of Delivery: 07/09/2016  Delivered By: Annamarie MajorPaul Harris, MD Delivery Type: repeat cesarean section, low transverse incision Intrapartum complications/course: None Anesthesia: spinal Placenta: manual removal Laceration: n/a Episiotomy: none Live born female  Birth Weight: 7 lb 5.5 oz (3330 g) APGAR: 9, 9   Post partum course: Since the delivery, patient has tolerate activity, diet, and daily functions without difficulty or complication.  Min lochia.  No breast concerns at this time.  No signs of depression currently. Her pain is well controlled with PO pain meds  Postpartum Exam:General appearance: alert, cooperative, appears stated age and no distress GI: soft, non-tender; bowel sounds normal; no masses,  no organomegaly Extremities: Homans sign is negative, no sign of DVT  Incision: C/D/I, no s/s infection  Disposition: home with infant Rh Immune globulin given: not applicable Rubella vaccine given: no Varicella vaccine given: no Tdap vaccine given in AP or PP setting: given during prenatal care Flu vaccine given in AP or PP setting: given during prenatal care Contraception: Depo-Provera  Prenatal Labs: O POS//Rubella Immune//RPR negative//HIV negative/HepB Surface Ag negative//plans to bottle feed  Plan:  Carla Ingram was discharged to home in good condition. [facility] Dr Tiburcio PeaHarris in 1 week  Discharge Medications:   Medication List    TAKE these medications        medroxyPROGESTERone 150 MG/ML injection  Commonly known as:  DEPO-PROVERA  Inject 1 mL (150 mg total) into the muscle once.     oxyCODONE-acetaminophen 5-325 MG tablet  Commonly known as:  ROXICET  Take 1-2 tablets by mouth  every 4 (four) hours as needed for severe pain.     PRENATAL VITAMIN PLUS LOW IRON 27-1 MG Tabs  Take 1 tablet by mouth daily.     ranitidine 150 MG tablet  Commonly known as:  ZANTAC  Take 150 mg by mouth 2 (two) times daily as needed for heartburn. Reported on 07/09/2016         Tresea MallGLEDHILL,Carla Ingram, CNM

## 2016-07-09 NOTE — Transfer of Care (Signed)
Immediate Anesthesia Transfer of Care Note  Patient: Carla Ingram  Procedure(s) Performed: Procedure(s): CESAREAN SECTION (N/A)  Patient Location: [facility]  Anesthesia Type:Spinal  Level of Consciousness: awake, alert  and oriented  Airway & Oxygen Therapy: Patient Spontanous Breathing  Post-op Assessment: Post -op Vital signs reviewed and stable  Post vital signs: stable  Last Vitals:  Filed Vitals:   07/09/16 0545 07/09/16 0842  BP: 117/71 109/63  Pulse: 97 81  Temp: 36.6 C 36.3 C  Resp: 16 20    Last Pain:  Filed Vitals:   07/09/16 0842  PainSc: 0-No pain         Complications: No apparent anesthesia complications

## 2016-07-09 NOTE — Progress Notes (Signed)
Day of Surgery Procedure(s) (LRB): CESAREAN SECTION (N/A)  Subjective: Patient reports min pain.  Min appetite.  No amb yet.  Infant doing well..    Objective: I have reviewed patient's vital signs, intake and output and medications.  Abd: Min T, ND Incision: clean, dry and intact Extr: no calf T, no edema  Assessment: s/p Procedure(s): CESAREAN SECTION (N/A): stable  Plan: Advance diet Advance to PO medication Cont post op care  LOS: 0 days    Carla LibraRobert Ingram Carla Ingram 07/09/2016, 1:23 PM

## 2016-07-09 NOTE — Op Note (Signed)
Cesarean Section Procedure Note Indications: prior cesarean section and term intrauterine pregnancy  Pre-operative Diagnosis: Intrauterine pregnancy 6953w3d ;  prior cesarean section and term intrauterine pregnancy Post-operative Diagnosis: same, delivered. Procedure: Low Transverse Cesarean Section Surgeon: Annamarie MajorPaul Ivannia Willhelm, MD, FACOG Assistant(s): Dr Bonney AidStaebler Anesthesia: Spinal anesthesia Estimated Blood Loss:250 mL Complications: None; patient tolerated the procedure well. Disposition: PACU - hemodynamically stable. Condition: stable  Findings: A female infant in the cephalic presentation. Amniotic fluid - Clear  Birth weight 7-5 lbs.  Apgars of 9 and 9.  Intact placenta with a three-vessel cord. Grossly normal uterus, tubes and ovaries bilaterally. Min intraabdominal adhesions were noted.  Very thin LUS due to prior surgeries.  Procedure Details   The patient was taken to Operating Room, identified as the correct patient and the procedure verified as C-Section Delivery. A Time Out was held and the above information confirmed. After induction of anesthesia, the patient was draped and prepped in the usual sterile manner. A Pfannenstiel incision was made and carried down through the subcutaneous tissue to the fascia. Fascial incision was made and extended transversely with the Mayo scissors. The fascia was separated from the underlying rectus tissue superiorly and inferiorly. The peritoneum was identified and entered bluntly. Peritoneal incision was extended longitudinally. The utero-vesical peritoneal reflection was incised transversely and a bladder flap was created digitally.  A low transverse hysterotomy was made. The fetus was delivered atraumatically. The umbilical cord was clamped x2 and cut and the infant was handed to the awaiting pediatricians. The placenta was removed intact and appeared normal with a 3-vessel cord.  The uterus was exteriorized and cleared of all clot and debris. The  hysterotomy was closed with running sutures of 0 Vicryl suture. A second imbricating layer was placed with the same suture. Excellent hemostasis was observed. The uterus was returned to the abdomen. The pelvis was irrigated and again, excellent hemostasis was noted.  The On Q Pain pump System was then placed.  Trocars were placed through the abdominal wall into the subfascial space and these were used to thread the silver soaker cathaters into place.The rectus fascia was then reapproximated with running sutures of Maxon, with careful placement not to incorporate the cathaters. Subcutaneous tissues are then irrigated with saline and hemostasis assured.  Skin is then closed with 4-0 vicryl suture in a subcuticular fashion followed by skin adhesive. The cathaters are flushed each with 5 mL of Bupivicaine and stabilized into place with dressing. Instrument, sponge, and needle counts were correct prior to the abdominal closure and at the conclusion of the case.  The patient tolerated the procedure well and was transferred to the recovery room in stable condition.

## 2016-07-09 NOTE — H&P (Signed)
History and Physical Interval Note:  07/09/2016 7:16 AM  Carla Ingram  has presented today for surgery, with the diagnosis of TERM PREGNANCY (39 + weeks) and  PRIOR CESAREAN SECTION  The various methods of treatment have been discussed with the patient and family. After consideration of risks, benefits and other options for treatment, the patient has consented to  Procedure(s): CESAREAN SECTION (N/A) as a surgical intervention .  The patient's history has been reviewed, patient examined, no change in status, stable for surgery.  Pt has the following beta blocker history-  Not taking Beta Blocker.  I have reviewed the patient's chart and labs.  Questions were answered to the patient's satisfaction.       Letitia Libraobert Paul Gibson Lad

## 2016-07-09 NOTE — Anesthesia Preprocedure Evaluation (Addendum)
Anesthesia Evaluation  Patient identified by MRN, date of birth, ID band Patient awake    Reviewed: Allergy & Precautions, NPO status , Patient's Chart, lab work & pertinent test results  History of Anesthesia Complications Negative for: history of anesthetic complications  Airway Mallampati: II       Dental   Pulmonary Current Smoker,           Cardiovascular negative cardio ROS       Neuro/Psych negative neurological ROS     GI/Hepatic Neg liver ROS, GERD  Medicated and Controlled,  Endo/Other  negative endocrine ROS  Renal/GU negative Renal ROS     Musculoskeletal   Abdominal   Peds  Hematology  (+) anemia ,   Anesthesia Other Findings   Reproductive/Obstetrics (+) Pregnancy                            Anesthesia Physical Anesthesia Plan  ASA: II  Anesthesia Plan: Spinal   Post-op Pain Management:    Induction:   Airway Management Planned:   Additional Equipment:   Intra-op Plan:   Post-operative Plan:   Informed Consent: I have reviewed the patients History and Physical, chart, labs and discussed the procedure including the risks, benefits and alternatives for the proposed anesthesia with the patient or authorized representative who has indicated his/her understanding and acceptance.     Plan Discussed with:   Anesthesia Plan Comments:         Anesthesia Quick Evaluation

## 2016-07-09 NOTE — Anesthesia Procedure Notes (Signed)
Spinal Patient location during procedure: OB Start time: 07/09/2016 7:35 AM End time: 07/09/2016 7:39 AM Staffing Anesthesiologist: Naomie DeanKEPHART, WILLIAM K Resident/CRNA: Vidal Lampkins Performed by: resident/CRNA  Preanesthetic Checklist Completed: patient identified, site marked, surgical consent, pre-op evaluation, timeout performed, IV checked, risks and benefits discussed and monitors and equipment checked Spinal Block Patient position: sitting Prep: Betadine Patient monitoring: heart rate, continuous pulse ox and blood pressure Approach: midline Location: L3-4 Injection technique: single-shot Needle Needle type: Whitacre  Needle gauge: 25 G

## 2016-07-10 LAB — CBC
HEMATOCRIT: 29.2 % — AB (ref 35.0–47.0)
Hemoglobin: 10.2 g/dL — ABNORMAL LOW (ref 12.0–16.0)
MCH: 32.5 pg (ref 26.0–34.0)
MCHC: 35 g/dL (ref 32.0–36.0)
MCV: 92.8 fL (ref 80.0–100.0)
Platelets: 365 10*3/uL (ref 150–440)
RBC: 3.15 MIL/uL — AB (ref 3.80–5.20)
RDW: 13.7 % (ref 11.5–14.5)
WBC: 16 10*3/uL — AB (ref 3.6–11.0)

## 2016-07-10 MED ORDER — DOCUSATE SODIUM 100 MG PO CAPS
100.0000 mg | ORAL_CAPSULE | Freq: Every day | ORAL | Status: DC
  Administered 2016-07-10: 100 mg via ORAL
  Filled 2016-07-10: qty 1

## 2016-07-10 MED ORDER — FERROUS SULFATE 325 (65 FE) MG PO TABS
325.0000 mg | ORAL_TABLET | Freq: Every day | ORAL | Status: DC
  Administered 2016-07-11: 325 mg via ORAL
  Filled 2016-07-10: qty 1

## 2016-07-10 NOTE — Progress Notes (Signed)
POD #1 repeat CS Subjective:  Doing well. Has voided 3 times since her foley was removed this AM. Tolerating regular diet. Passing flatus.  Breast and bottle feeding.    Objective:  Blood pressure 117/64, pulse 80, temperature 98 F (36.7 C), temperature source Oral, resp. rate 18, height 5\' 3"  (1.6 m), weight 89.812 kg (198 lb), last menstrual period 09/05/2015, SpO2 99 %, unknown if currently breastfeeding.  General: NAD, wants ON Q removed Heart: RRR without murmur Pulmonary: no increased work of breathing, CTA. Abdomen: non-distended, non-tender, fundus firm at level of umbilicus-1 FB, bowel sounds active Incision: honey comb dressing intact. Leaking serosanguinous drainage from ON Q catheter sites. ON Q catheters removed easily intact. 4x$ placed over the catheter sites. Extremities: no edema, no erythema, no tenderness  Results for orders placed or performed during the hospital encounter of 07/09/16 (from the past 72 hour(s))  Chlamydia/NGC rt PCR ([facility] only)     Status: None   Collection Time: 07/09/16  7:13 AM  Result Value Ref Range   Specimen source GC/Chlam URINE, RANDOM    Chlamydia Tr NOT DETECTED NOT DETECTED   N gonorrhoeae NOT DETECTED NOT DETECTED    Comment: (NOTE) 100  This methodology has not been evaluated in pregnant women or in 200  patients with a history of hysterectomy. 300 400  This methodology will not be performed on patients less than 4114  years of age.   CBC     Status: Abnormal   Collection Time: 07/10/16  5:10 AM  Result Value Ref Range   WBC 16.0 (H) 3.6 - 11.0 K/uL   RBC 3.15 (L) 3.80 - 5.20 MIL/uL   Hemoglobin 10.2 (L) 12.0 - 16.0 g/dL   HCT 40.929.2 (L) 81.135.0 - 91.447.0 %   MCV 92.8 80.0 - 100.0 fL   MCH 32.5 26.0 - 34.0 pg   MCHC 35.0 32.0 - 36.0 g/dL   RDW 78.213.7 95.611.5 - 21.314.5 %   Platelets 365 150 - 440 K/uL     Assessment:   20 y.o. [id number] postoperativeday # 1-stable-   Plan:  1) Acute blood loss anemia - hemodynamically stable and  asymptomatic - po iron and vitamins  2) --/--/O POS (07/17 0935) Ishmael Holter/Rubella  Immune (11/29 0000) / Varicella immune  3) TDAP UTD  4) Breast/Bottle/Contraception: Depo  5) Disposition-discharge in 1-2 days  Nazeer Romney, CNM

## 2016-07-10 NOTE — Anesthesia Post-op Follow-up Note (Signed)
  Anesthesia Pain Follow-up Note  Patient: Carla DykesJamy K Granados  Day #: 1  Date of Follow-up: 07/10/2016 Time: 7:26 AM  Last Vitals:  Filed Vitals:   07/10/16 0028 07/10/16 0400  BP: 110/56 114/73  Pulse: 83 76  Temp: 36.9 C 36.7 C  Resp: 18 18    Level of Consciousness: alert  Pain: mild   Side Effects:None  Catheter Site Exam: site not evaluated     Plan: D/C from anesthesia care  Clydene PughBeane, Greenly Rarick D

## 2016-07-10 NOTE — Anesthesia Postprocedure Evaluation (Signed)
Anesthesia Post Note  Patient: Carla Ingram  Procedure(s) Performed: Procedure(s) (LRB): CESAREAN SECTION (N/A)  Patient location during evaluation: Mother Baby Anesthesia Type: Spinal Level of consciousness: awake and alert and oriented Pain management: satisfactory to patient Vital Signs Assessment: post-procedure vital signs reviewed and stable Respiratory status: respiratory function stable Cardiovascular status: stable Postop Assessment: no headache, no backache, spinal receding, no signs of nausea or vomiting, patient able to bend at knees and adequate PO intake Anesthetic complications: no    Last Vitals:  Filed Vitals:   07/10/16 0028 07/10/16 0400  BP: 110/56 114/73  Pulse: 83 76  Temp: 36.9 C 36.7 C  Resp: 18 18    Last Pain:  Filed Vitals:   07/10/16 0401  PainSc: 0-No pain                 Clydene PughBeane, Bevely Hackbart D

## 2016-07-11 MED ORDER — OXYCODONE-ACETAMINOPHEN 5-325 MG PO TABS: 1.0000 | ORAL_TABLET | ORAL | Status: DC | PRN

## 2016-07-11 MED ORDER — MEDROXYPROGESTERONE ACETATE 150 MG/ML IM SUSP: 150.0000 mg | Freq: Once | INTRAMUSCULAR | Status: DC

## 2016-07-11 NOTE — Discharge Instructions (Signed)
Please call your doctor or return to the ER if you experience any chest pains, shortness of breath, fever greater than 101, any heavy bleeding or large clots, and foul smelling vaginal discharge, any worsening abdominal pain & cramping that is not controlled by pain medication, or any signs of post partum depression.  No tampons, enemas, douches, or sexual intercourse for 6 weeks.  Also avoid tub baths, hot tubs, or swimming for 6 weeks. ° °Cesarean Delivery, Care After °Refer to this sheet in the next few weeks. These instructions provide you with information on caring for yourself after your procedure. Your health care provider may also give you specific instructions. Your treatment has been planned according to current medical practices, but problems sometimes occur. Call your health care provider if you have any problems or questions after you go home. °HOME CARE INSTRUCTIONS  °· Only take over-the-counter or prescription medications as directed by your health care provider. °· Do not drink alcohol, especially if you are breastfeeding or taking medication to relieve pain. °· Do not chew or smoke tobacco. °· Continue to use good perineal care. Good perineal care includes: °¨ Wiping your perineum from front to back. °¨ Keeping your perineum clean. °· Check your surgical cut (incision) daily for increased redness, drainage, swelling, or separation of skin. °· Clean your incision gently with soap and water every day, and then pat it dry. If your health care provider says it is okay, leave the incision uncovered. Use a bandage (dressing) if the incision is draining fluid or appears irritated. If the adhesive strips across the incision do not fall off within 7 days, carefully peel them off. °· Hug a pillow when coughing or sneezing until your incision is healed. This helps to relieve pain. °· Do not use tampons or douche until your health care provider says it is okay. °· Shower, wash your hair, and take tub baths as  directed by your health care provider. °· Wear a well-fitting bra that provides breast support. °· Limit wearing support panties or control-top hose. °· Drink enough fluids to keep your urine clear or pale yellow. °· Eat high-fiber foods such as whole grain cereals and breads, brown rice, beans, and fresh fruits and vegetables every day. These foods may help prevent or relieve constipation. °· Resume activities such as climbing stairs, driving, lifting, exercising, or traveling as directed by your health care provider. °· Talk to your health care provider about resuming sexual activities. This is dependent upon your risk of infection, your rate of healing, and your comfort and desire to resume sexual activity. °· Try to have someone help you with your household activities and your newborn for at least a few days after you leave the hospital. °· Rest as much as possible. Try to rest or take a nap when your newborn is sleeping. °· Increase your activities gradually. °· Keep all of your scheduled postpartum appointments. It is very important to keep your scheduled follow-up appointments. At these appointments, your health care provider will be checking to make sure that you are healing physically and emotionally. °SEEK MEDICAL CARE IF:  °· You are passing large clots from your vagina. Save any clots to show your health care provider. °· You have a foul smelling discharge from your vagina. °· You have trouble urinating. °· You are urinating frequently. °· You have pain when you urinate. °· You have a change in your bowel movements. °· You have increasing redness, pain, or swelling near your incision. °·   You have pus draining from your incision.  Your incision is separating.  You have painful, hard, or reddened breasts.  You have a severe headache.  You have blurred vision or see spots.  You feel sad or depressed.  You have thoughts of hurting yourself or your newborn.  You have questions about your  care, the care of your newborn, or medications.  You are dizzy or light-headed.  You have a rash.  You have pain, redness, or swelling at the site of the removed intravenous access (IV) tube.  You have nausea or vomiting.  You stopped breastfeeding and have not had a menstrual period within 12 weeks of stopping.  You are not breastfeeding and have not had a menstrual period within 12 weeks of delivery.  You have a fever. SEEK IMMEDIATE MEDICAL CARE IF:  You have persistent pain.  You have chest pain.  You have shortness of breath.  You faint.  You have leg pain.  You have stomach pain.  Your vaginal bleeding saturates 2 or more sanitary pads in 1 hour. MAKE SURE YOU:   Understand these instructions.  Will watch your condition.  Will get help right away if you are not doing well or get worse.   This information is not intended to replace advice given to you by your health care provider. Make sure you discuss any questions you have with your health care provider.   Document Released: 08/31/2002 Document Revised: 12/30/2014 Document Reviewed: 08/05/2012 Elsevier Interactive Patient Education [employer]2016 Elsevier Inc.

## 2016-07-11 NOTE — Progress Notes (Signed)
D/C order from MD.  Reviewed d/c instructions and prescriptions with patient and answered any questions.  Patient d/c home with infant via wheelchair by nursing/auxillary. 

## 2016-09-03 LAB — HM PAP SMEAR: HM Pap smear: NEGATIVE

## 2016-11-18 ENCOUNTER — Ambulatory Visit: Payer: Medicaid Other | Admitting: Family Medicine

## 2017-02-11 ENCOUNTER — Ambulatory Visit: Payer: Medicaid Other | Admitting: Family Medicine

## 2017-02-24 ENCOUNTER — Encounter: Payer: Self-pay | Admitting: Family Medicine

## 2017-02-24 ENCOUNTER — Ambulatory Visit (INDEPENDENT_AMBULATORY_CARE_PROVIDER_SITE_OTHER): Payer: Medicaid Other | Admitting: Family Medicine

## 2017-02-24 VITALS — BP 104/68 | HR 94 | Temp 98.1°F | Resp 19 | Ht 63.0 in | Wt 167.1 lb

## 2017-02-24 DIAGNOSIS — J069 Acute upper respiratory infection, unspecified: Secondary | ICD-10-CM | POA: Diagnosis not present

## 2017-02-24 DIAGNOSIS — L408 Other psoriasis: Secondary | ICD-10-CM | POA: Diagnosis not present

## 2017-02-24 NOTE — Progress Notes (Signed)
Name: Carla Ingram   MRN: [id number]    DOB: 1996/06/06   Date:02/24/2017       Progress Note  Subjective  Chief Complaint  Chief Complaint  Patient presents with  . Rash    Onset-couple of months, Cirrohosis was improving but than got worst. Putting Hydrocortisone and vaseline on the areas with some relief-dried them out.    HPI  Psoriasis: she was diagnosed at age 21 with psoriasis. She was going to [facility], but last severe flare was 3 years ago ( she has not been back since and is now considered  a new patient). Symptoms of rash and itching re-started Fall of last year, but is getting progressively worse, skin is cracking and is getting painful. No fever, no oozing or signs of infection at this time.   URI: symptoms of clear rhinorrhea, mild cough and nasal congestion started this morning, no fever or chills, normal appetite  Patient Active Problem List   Diagnosis Date Noted  . History of 2 cesarean sections 07/09/2016  . B12 deficiency 11/01/2015  . Tobacco use disorder 11/01/2015  . 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 Other psoriasis 02/26/2010    Past Surgical History:  Procedure Laterality Date  . CESAREAN SECTION  2012, 2016  . CESAREAN SECTION N/A 07/09/2016   Procedure: CESAREAN SECTION;  Surgeon: Nadara Mustardobert P Harris, MD;  Location: [facility] ORS;  Service: Obstetrics;  Laterality: N/A;  . DILATION AND CURETTAGE OF UTERUS  2009    Family History  Problem Relation Age of Onset  . CVA Mother     Social History   Social History  . Marital status: Single    Spouse name: N/A  . Number of children: N/A  . Years of education: N/A   Occupational History  . Not on file.   Social History Main Topics  . Smoking status: Current Every Day Smoker    Packs/day: 0.50    Years: 11.00    Types: Cigarettes    Start date: 10/31/2004  . Smokeless tobacco: Never Used  . Alcohol use No  . Drug use: No  . Sexual activity: Yes    Partners: Male    Birth  control/ protection: Pill   Other Topics Concern  . Not on file   Social History Narrative  . No narrative on file     Current Outpatient Prescriptions:  .  medroxyPROGESTERone (DEPO-PROVERA) 150 MG/ML injection, Inject 1 mL (150 mg total) into the muscle once., Disp: 1 mL, Rfl: 3  Allergies  Allergen Reactions  . Augmentin [Amoxicillin-Pot Clavulanate] Hives and Swelling  . Cephalexin     Achy in bones  . Penicillins Hives and Swelling     ROS  Ten systems reviewed and is negative except as mentioned in HPI   Objective  Vitals:   02/24/17 1453  BP: 104/68  Pulse: 94  Resp: 19  Temp: 98.1 F (36.7 C)  TempSrc: Oral  SpO2: 98%  Weight: 167 lb 1.6 oz (75.8 kg)  Height: 5\' 3"  (1.6 m)    Body mass index is 29.6 kg/m.  Physical Exam  Constitutional: Patient appears well-developed and well-nourished. No distress.  HEENT: head atraumatic, normocephalic, pupils equal and reactive to light,  neck supple, throat within normal limits Cardiovascular: Normal rate, regular rhythm and normal heart sounds.  No murmur heard. No BLE edema. Pulmonary/Chest: Effort normal and breath sounds normal. No respiratory distress. Abdominal: Soft.  There is no tenderness. Psychiatric: Patient has a normal mood  and affect. behavior is normal. Judgment and thought content normal. Skin: psoriatic plaques everywhere, including scalp. Very red , scaly and some silver lining at areas  PHQ2/9: Depression screen [facility] 2/9 02/24/2017 11/01/2015  Decreased Interest 0 0  Down, Depressed, Hopeless 0 0  PHQ - 2 Score 0 0     Fall Risk: Fall Risk  02/24/2017 11/01/2015  Falls in the past year? No No     Functional Status Survey: Is the patient deaf or have difficulty hearing?: No Does the patient have difficulty seeing, even when wearing glasses/contacts?: No Does the patient have difficulty concentrating, remembering, or making decisions?: No Does the patient have difficulty walking or climbing  stairs?: No Does the patient have difficulty dressing or bathing?: No Does the patient have difficulty doing errands alone such as visiting a doctor's office or shopping?: No    Assessment & Plan  1. Other psoriasis  - Ambulatory referral to Dermatology, explained importance of regular follow up with Dermatologist   2. URI, acute  Symptoms started now, advised to use otc medication for now

## 2017-03-28 ENCOUNTER — Telehealth: Payer: Self-pay | Admitting: Family Medicine

## 2017-03-28 DIAGNOSIS — L408 Other psoriasis: Secondary | ICD-10-CM

## 2017-03-28 NOTE — Telephone Encounter (Signed)
Rayfield Citizen from [facility] states patient was referred to them by Wynelle Beckmann ([facility]). They are asking that we send a referral since we are the PCP. Appointment is scheduled for Tuesday 04/01/17 and they are needing it by then.   (P) [phone] (F) [phone]

## 2017-03-28 NOTE — Telephone Encounter (Signed)
Referral has been placed and sent

## 2017-03-28 NOTE — Telephone Encounter (Signed)
[facility] called for NPI and it was given to them

## 2017-04-21 ENCOUNTER — Ambulatory Visit (INDEPENDENT_AMBULATORY_CARE_PROVIDER_SITE_OTHER): Payer: Medicaid Other

## 2017-04-21 DIAGNOSIS — N912 Amenorrhea, unspecified: Secondary | ICD-10-CM

## 2017-04-21 DIAGNOSIS — R350 Frequency of micturition: Secondary | ICD-10-CM | POA: Diagnosis not present

## 2017-04-21 DIAGNOSIS — R300 Dysuria: Secondary | ICD-10-CM

## 2017-04-21 LAB — POCT URINALYSIS DIP (MANUAL ENTRY)
BILIRUBIN UA: NEGATIVE
Glucose, UA: NEGATIVE mg/dL
Ketones, POC UA: NEGATIVE mg/dL
Leukocytes, UA: NEGATIVE
NITRITE UA: POSITIVE — AB
PH UA: 5 (ref 5.0–8.0)
Protein Ur, POC: NEGATIVE mg/dL
RBC UA: NEGATIVE
Spec Grav, UA: 1.02 (ref 1.010–1.025)
UROBILINOGEN UA: NEGATIVE U/dL — AB

## 2017-04-21 LAB — POCT URINE PREGNANCY: PREG TEST UR: NEGATIVE

## 2017-04-21 MED ORDER — MEDROXYPROGESTERONE ACETATE 150 MG/ML IM SUSP
150.0000 mg | Freq: Once | INTRAMUSCULAR | Status: AC
  Administered 2017-04-21: 150 mg via INTRAMUSCULAR

## 2017-04-21 NOTE — Progress Notes (Signed)
NDC#[id number] 

## 2017-04-25 ENCOUNTER — Other Ambulatory Visit: Payer: Self-pay | Admitting: Obstetrics & Gynecology

## 2017-04-25 DIAGNOSIS — O2342 Unspecified infection of urinary tract in pregnancy, second trimester: Secondary | ICD-10-CM | POA: Insufficient documentation

## 2017-04-25 DIAGNOSIS — N390 Urinary tract infection, site not specified: Secondary | ICD-10-CM | POA: Insufficient documentation

## 2017-04-25 DIAGNOSIS — N3000 Acute cystitis without hematuria: Secondary | ICD-10-CM

## 2017-04-25 LAB — URINE CULTURE

## 2017-04-25 MED ORDER — SULFAMETHOXAZOLE-TRIMETHOPRIM 800-160 MG PO TABS: 1.0000 | ORAL_TABLET | Freq: Two times a day (BID) | ORAL | 0 refills | Status: DC

## 2017-04-25 NOTE — Progress Notes (Signed)
D/w pt, ABX sent to pharmacy.

## 2017-05-18 ENCOUNTER — Encounter: Payer: Self-pay | Admitting: Emergency Medicine

## 2017-05-18 ENCOUNTER — Emergency Department
Admission: EM | Admit: 2017-05-18 | Discharge: 2017-05-18 | Disposition: A | Discharge status: Home / Self Care | Payer: Medicaid Other | Attending: Emergency Medicine | Admitting: Emergency Medicine

## 2017-05-18 DIAGNOSIS — F17210 Nicotine dependence, cigarettes, uncomplicated: Secondary | ICD-10-CM | POA: Diagnosis not present

## 2017-05-18 DIAGNOSIS — M79601 Pain in right arm: Secondary | ICD-10-CM | POA: Diagnosis present

## 2017-05-18 DIAGNOSIS — M436 Torticollis: Secondary | ICD-10-CM | POA: Diagnosis not present

## 2017-05-18 LAB — POCT PREGNANCY, URINE: Preg Test, Ur: NEGATIVE

## 2017-05-18 LAB — BASIC METABOLIC PANEL
ANION GAP: 9 (ref 5–15)
BUN: 10 mg/dL (ref 6–20)
CO2: 21 mmol/L — ABNORMAL LOW (ref 22–32)
Calcium: 9 mg/dL (ref 8.9–10.3)
Chloride: 106 mmol/L (ref 101–111)
Creatinine, Ser: 0.79 mg/dL (ref 0.44–1.00)
Glucose, Bld: 101 mg/dL — ABNORMAL HIGH (ref 65–99)
Potassium: 3.2 mmol/L — ABNORMAL LOW (ref 3.5–5.1)
SODIUM: 136 mmol/L (ref 135–145)

## 2017-05-18 LAB — CBC
HCT: 37.7 % (ref 35.0–47.0)
HEMOGLOBIN: 13.1 g/dL (ref 12.0–16.0)
MCH: 32.4 pg (ref 26.0–34.0)
MCHC: 34.8 g/dL (ref 32.0–36.0)
MCV: 93.3 fL (ref 80.0–100.0)
Platelets: 385 10*3/uL (ref 150–440)
RBC: 4.04 MIL/uL (ref 3.80–5.20)
RDW: 14 % (ref 11.5–14.5)
WBC: 8.8 10*3/uL (ref 3.6–11.0)

## 2017-05-18 LAB — ETHANOL

## 2017-05-18 LAB — TROPONIN I: Troponin I: <0.03 ng/mL (ref <0.03)

## 2017-05-18 MED ORDER — OXYCODONE-ACETAMINOPHEN 5-325 MG PO TABS
1.0000 | ORAL_TABLET | Freq: Once | ORAL | Status: AC
  Administered 2017-05-18: 1 via ORAL
  Filled 2017-05-18: qty 1

## 2017-05-18 MED ORDER — CYCLOBENZAPRINE HCL 5 MG PO TABS: 5.0000 mg | ORAL_TABLET | Freq: Three times a day (TID) | ORAL | 0 refills | Status: DC | PRN

## 2017-05-18 MED ORDER — NAPROXEN 375 MG PO TABS: 375.0000 mg | ORAL_TABLET | Freq: Two times a day (BID) | ORAL | 2 refills | Status: DC

## 2017-05-18 MED ORDER — LORAZEPAM 2 MG/ML IJ SOLN
1.0000 mg | Freq: Once | INTRAMUSCULAR | Status: AC
  Administered 2017-05-18: 1 mg via INTRAVENOUS
  Filled 2017-05-18: qty 1

## 2017-05-18 NOTE — ED Provider Notes (Addendum)
[facility] Emergency Department Provider Note  ____________________________________________   I have reviewed the triage vital signs and the nursing notes.   HISTORY  Chief Complaint Arm Pain; Numbness; and Chest Pain    HPI Carla Ingram is a 21 y.o. female  who presents today complaining of spasm to the muscles of her right trapezius region and pain shooting down her right arm. She does not have any neck pain she did not have a trauma she denies weakness states it hurts to move, she does have a 32-month-old child she carries with that arm and she thinks she may have strained it. She has had no headache, no chest pain no stiff neck. The pain is in the shoulder region. She denies any fever or chills or cough or shortness of breath. Patient is able to hold up her arm and waving around to show me exactly where it hurts. Initially, it was just her hand that felt pain and then it was the entire arm. Does do repetitive motions with that arm. She has not had this before. Patient is very anxious and upset. Started about an hour ago. Nothing exhibited nothing makes it worse, no other aggravating or alleviating symptoms attributed no prior treatment. No history of cauda equina symptoms such as incontinence of bowel or bladder, no focal numbness or weakness, just pain that shoots down her arm     Past Medical History:  Diagnosis Date  . Anemia   . GERD (gastroesophageal reflux disease)   . Placental abruption    2012 at 35 wks  . Positive pregnancy test   . Psoriasis   . TMJ (dislocation of temporomandibular joint)     Patient Active Problem List   Diagnosis Date Noted  . UTI (urinary tract infection) 04/25/2017  . History of 2 cesarean sections 07/09/2016  . B12 deficiency 11/01/2015  . Tobacco use disorder 11/01/2015  . 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 Other psoriasis 02/26/2010    Past Surgical History:  Procedure Laterality Date  .  CESAREAN SECTION  2012, 2016  . CESAREAN SECTION N/A 07/09/2016   Procedure: CESAREAN SECTION;  Surgeon: Nadara Mustard, MD;  Location: [facility] ORS;  Service: Obstetrics;  Laterality: N/A;  . DILATION AND CURETTAGE OF UTERUS  2009    Prior to Admission medications   Medication Sig Start Date End Date Taking? Authorizing Provider  medroxyPROGESTERone (DEPO-PROVERA) 150 MG/ML injection Inject 1 mL (150 mg total) into the muscle once. 07/11/16   Tresea Mall, CNM  sulfamethoxazole-trimethoprim (BACTRIM DS,SEPTRA DS) 800-160 MG tablet Take 1 tablet by mouth 2 (two) times daily. For 3 days 04/25/17   Nadara Mustard, MD    Allergies Augmentin [amoxicillin-pot clavulanate]; Cephalexin; and Penicillins  Family History  Problem Relation Age of Onset  . CVA Mother     Social History Social History  Substance Use Topics  . Smoking status: Current Every Day Smoker    Packs/day: 0.50    Years: 11.00    Types: Cigarettes    Start date: 10/31/2004  . Smokeless tobacco: Never Used  . Alcohol use No    Review of Systems Constitutional: No fever/chills Eyes: No visual changes. ENT: No sore throat. No stiff neck no neck pain Cardiovascular: Denies chest pain. Respiratory: Denies shortness of breath. Gastrointestinal:   no vomiting.  No diarrhea.  No constipation. Genitourinary: Negative for dysuria. Musculoskeletal: Negative lower extremity swelling Skin: Negative for rash. Neurological: Negative for severe headaches, focal weakness or numbness.  ____________________________________________   PHYSICAL EXAM:  VITAL SIGNS: ED Triage Vitals  Enc Vitals Group     BP 05/18/17 2106 (!) 150/83     Pulse Rate 05/18/17 2106 79     Resp 05/18/17 2106 20     Temp 05/18/17 2106 98.2 F (36.8 C)     Temp Source 05/18/17 2106 Oral     SpO2 05/18/17 2106 99 %     Weight 05/18/17 2106 175 lb (79.4 kg)     Height 05/18/17 2106 5\' 3"  (1.6 m)     Head Circumference --      Peak Flow --       Pain Score 05/18/17 2220 10     Pain Loc --      Pain Edu? --      Excl. in GC? --     Constitutional: Alert and oriented. Well appearing and in no acute distress.Patient is anxious and upset but she is seen to use that arm with no difficulty Eyes: Conjunctivae are normal Head: Atraumatic HEENT: No congestion/rhinnorhea. Mucous membranes are moist.  Oropharynx non-erythematous Neck:   There is no midline tenderness, she does have acute muscle spasm of the trapezius muscle at her cesarean she states "ouch that's the pain right there", in pulls back, is not erythematous, there is no mass there is no cellulitic or other changes. There is no bony tenderness to the neck. There is no masses, no stridor. Patient can look to the left and up and down with no evidence of meningismus or pain however when she turns her neck to the right, the affected side, that seems to stretch the muscles and cause more discomfort. Cardiovascular: Normal rate, regular rhythm. Grossly normal heart sounds.  Good peripheral circulation. Respiratory: Normal respiratory effort.  No retractions. Lungs CTAB. Abdominal: Soft and nontender. No distention. No guarding no rebound Back:  There is no focal tenderness or step off.  there is no midline tenderness there are no lesions noted. there is no CVA tenderness  Musculoskeletal: She states it hurts to move her right arm however she is spontaneously moving it without difficulty, she states as makes the pain worse however.. The arm itself is normal in appearance. No joint effusions, no DVT signs strong distal pulses no edema Neurologic:  Normal speech and language. No gross focal neurologic deficits are appreciated. Grip strength is equal bilaterally, there is no focal numbness or weakness appreciated, Cranial nerves II through XII are grossly intact 5 out of 5 strength bilateral upper and lower extremity. Finger to nose within normal limits heel to shin within normal limits, speech is  normal with no word finding difficulty or dysarthria, reflexes symmetric, pupils are equally round and reactive to light, there is no pronator drift, sensation is normal, vision is intact to confrontation, gait is deferred, there is no nystagmus, normal neurologic exam Skin:  Skin is warm, dry and intact. Chronic oriented rash noted no acute infection noted Psychiatric: Mood and affect are anxious. Speech and behavior are normal.  ____________________________________________   LABS (all labs ordered are listed, but only abnormal results are displayed)  Labs Reviewed  BASIC METABOLIC PANEL - Abnormal; Notable for the following:       Result Value   Potassium 3.2 (*)    CO2 21 (*)    Glucose, Bld 101 (*)    All other components within normal limits  CBC  TROPONIN I  ETHANOL  POC URINE PREG, ED   ____________________________________________  EKG  I  personally interpreted any EKGs ordered by me or triage Normal sinus rhythm, rate 88 bpm, no acute ST elevation or depression, normal axis specific ST changes ____________________________________________  RADIOLOGY  I reviewed any imaging ordered by me or triage that were performed during my shift and, if possible, patient and/or family made aware of any abnormal findings. ____________________________________________   PROCEDURES  Procedure(s) performed: None  Procedures  Critical Care performed: None  ____________________________________________   INITIAL IMPRESSION / ASSESSMENT AND PLAN / ED COURSE  Pertinent labs & imaging results that were available during my care of the patient were reviewed by me and considered in my medical decision making (see chart for details).  Patient here with very reproducible muscle spasm in her right trapezius muscle which she states radiates down her arm. Patient however is able to move her arm without difficulty. There is no evidence of numbness or weakness. There is no evidence of ACS PE  or dissection. This is again reproducible muscle pain. I gave her some Ativan and a Percocet and her pain is almost gone. When I entered the room she was holding her phone with her left hand and using her right hand to push buttons on it which I take to be a positive prognostic sign. The patient remains neurologically intact. I did discuss with her the possibility that this represents a radiculopathy possibly of cervical large and but she's had no trauma don't think acute imaging is likely to be of utility. Patient states that her pain is gone and she feels much better at this time. It is still the case and I can reproduce the pain right pinching her trapezius muscle. I have no evidence of cauda equina syndrome or acute neurologic dysfunction. In her complaint is a shooting pain from her trapezius muscle. Patient does use this for heavy carrying especially with her 8426-month-old child. Certainly not impossible this is a muscle strain, however, no other acute pathology is made apparent at this time. She will need close outpatient follow-up and I have strongly advised that. We'll continue her pain control as an outpatient. I don't see any red flies to suggest ACS PE dissection myocarditis endocarditis pericarditis pneumonia and pneumothorax pericardial effusion and adenopathy get chest x-ray will be of utility given her symptoms and clear lungs. I don't see any indication that there is any acute referred intra-abdominal pathology either.  ----------------------------------------- 11:06 PM on 05/18/2017 -----------------------------------------  Patient laughing and joking use in that arm with no discomfort or distress, at this time, she is using it to manipulate her cell phone and she has no evidence of ongoing discomfort. This was always somewhat distractible to start with. In any event no red flags and we will discharge her with return precautions and follow-up     ____________________________________________   FINAL CLINICAL IMPRESSION(S) / ED DIAGNOSES  Final diagnoses:  None      This chart was dictated using voice recognition software.  Despite best efforts to proofread,  errors can occur which can change meaning.      Jeanmarie PlantMcShane, James A, MD 05/18/17 2256    Jeanmarie PlantMcShane, James A, MD 05/18/17 2257    Jeanmarie PlantMcShane, James A, MD 05/18/17 2306

## 2017-05-18 NOTE — ED Notes (Signed)
Pt now able to use both right and left arms freely.  Pt reports feeling better at this time after receiving medication.  EDP aware.

## 2017-05-18 NOTE — Discharge Instructions (Signed)
As always a chance that you have a pinched nerve in her neck causing your symptoms but at this time it seems more like a muscle spasm. We do ask that if you have any numbness or weakness, any increased pain, chest pain, shortness of breath, or feel worse in any way that you return please immediately to the emergency department. Otherwise, follow-up with your doctor first thing after the holiday.

## 2017-05-18 NOTE — ED Notes (Signed)
Pt discharged to home.  Family member driving.  Discharge instructions reviewed.  Verbalized understanding.  No questions or concerns at this time.  Teach back verified.  Pt in NAD.  No items left in ED.   

## 2017-05-18 NOTE — ED Triage Notes (Signed)
Pt presents to ED c/o sudden right arm pain/numbness that radiates to left chest and shoulder 1 hour ago. Pt complains of dizziness lately as well.

## 2017-06-29 ENCOUNTER — Emergency Department
Admission: EM | Admit: 2017-06-29 | Discharge: 2017-06-29 | Disposition: A | Discharge status: Home / Self Care | Payer: Medicaid Other | Attending: Emergency Medicine | Admitting: Emergency Medicine

## 2017-06-29 DIAGNOSIS — F17210 Nicotine dependence, cigarettes, uncomplicated: Secondary | ICD-10-CM | POA: Insufficient documentation

## 2017-06-29 DIAGNOSIS — N3090 Cystitis, unspecified without hematuria: Secondary | ICD-10-CM | POA: Insufficient documentation

## 2017-06-29 DIAGNOSIS — R110 Nausea: Secondary | ICD-10-CM | POA: Diagnosis present

## 2017-06-29 DIAGNOSIS — Z79899 Other long term (current) drug therapy: Secondary | ICD-10-CM | POA: Insufficient documentation

## 2017-06-29 LAB — URINALYSIS, COMPLETE (UACMP) WITH MICROSCOPIC
BILIRUBIN URINE: NEGATIVE
GLUCOSE, UA: NEGATIVE mg/dL
HGB URINE DIPSTICK: NEGATIVE
KETONES UR: NEGATIVE mg/dL
LEUKOCYTES UA: NEGATIVE
NITRITE: POSITIVE — AB
PH: 5 (ref 5.0–8.0)
Protein, ur: NEGATIVE mg/dL
SPECIFIC GRAVITY, URINE: 1.012 (ref 1.005–1.030)

## 2017-06-29 LAB — COMPREHENSIVE METABOLIC PANEL
ALBUMIN: 4.2 g/dL (ref 3.5–5.0)
ALT: 17 U/L (ref 14–54)
ANION GAP: 7 (ref 5–15)
AST: 20 U/L (ref 15–41)
Alkaline Phosphatase: 103 U/L (ref 38–126)
BILIRUBIN TOTAL: 1 mg/dL (ref 0.3–1.2)
BUN: 6 mg/dL (ref 6–20)
CHLORIDE: 105 mmol/L (ref 101–111)
CO2: 26 mmol/L (ref 22–32)
Calcium: 9.3 mg/dL (ref 8.9–10.3)
Creatinine, Ser: 0.82 mg/dL (ref 0.44–1.00)
GFR calc Af Amer: >60 mL/min (ref >60)
GLUCOSE: 92 mg/dL (ref 65–99)
POTASSIUM: 3.2 mmol/L — AB (ref 3.5–5.1)
Sodium: 138 mmol/L (ref 135–145)
TOTAL PROTEIN: 7.5 g/dL (ref 6.5–8.1)

## 2017-06-29 LAB — CBC
HEMATOCRIT: 42.7 % (ref 35.0–47.0)
HEMOGLOBIN: 15.2 g/dL (ref 12.0–16.0)
MCH: 33.7 pg (ref 26.0–34.0)
MCHC: 35.6 g/dL (ref 32.0–36.0)
MCV: 94.7 fL (ref 80.0–100.0)
Platelets: 380 10*3/uL (ref 150–440)
RBC: 4.5 MIL/uL (ref 3.80–5.20)
RDW: 13.3 % (ref 11.5–14.5)
WBC: 7.9 10*3/uL (ref 3.6–11.0)

## 2017-06-29 LAB — LIPASE, BLOOD: LIPASE: 22 U/L (ref 11–51)

## 2017-06-29 LAB — POCT PREGNANCY, URINE: Preg Test, Ur: NEGATIVE

## 2017-06-29 MED ORDER — ONDANSETRON 4 MG PO TBDP: 4.0000 mg | ORAL_TABLET | Freq: Three times a day (TID) | ORAL | 0 refills | Status: DC | PRN

## 2017-06-29 MED ORDER — NITROFURANTOIN MACROCRYSTAL 100 MG PO CAPS: 100.0000 mg | ORAL_CAPSULE | Freq: Two times a day (BID) | ORAL | 0 refills | Status: DC

## 2017-06-29 NOTE — ED Triage Notes (Signed)
Pt reports nausea and dizziness for the past week, decreased appetite. Pt states she passed out on the floor.  Pt states the sxs are similar to when she was pregnant before.  Pt states she took a pregnancy test last Monday and was negative. Pt states she is on the depo shot. Pt states she has not missed her shot.

## 2017-06-29 NOTE — Discharge Instructions (Signed)
Results for orders placed or performed during the hospital encounter of 06/29/17  Lipase, blood  Result Value Ref Range   Lipase 22 11 - 51 U/L  Comprehensive metabolic panel  Result Value Ref Range   Sodium 138 135 - 145 mmol/L   Potassium 3.2 (L) 3.5 - 5.1 mmol/L   Chloride 105 101 - 111 mmol/L   CO2 26 22 - 32 mmol/L   Glucose, Bld 92 65 - 99 mg/dL   BUN 6 6 - 20 mg/dL   Creatinine, Ser 1.610.82 0.44 - 1.00 mg/dL   Calcium 9.3 8.9 - 09.610.3 mg/dL   Total Protein 7.5 6.5 - 8.1 g/dL   Albumin 4.2 3.5 - 5.0 g/dL   AST 20 15 - 41 U/L   ALT 17 14 - 54 U/L   Alkaline Phosphatase 103 38 - 126 U/L   Total Bilirubin 1.0 0.3 - 1.2 mg/dL   GFR calc non Af Amer >60 >60 mL/min   GFR calc Af Amer >60 >60 mL/min   Anion gap 7 5 - 15  CBC  Result Value Ref Range   WBC 7.9 3.6 - 11.0 K/uL   RBC 4.50 3.80 - 5.20 MIL/uL   Hemoglobin 15.2 12.0 - 16.0 g/dL   HCT 04.542.7 40.935.0 - 81.147.0 %   MCV 94.7 80.0 - 100.0 fL   MCH 33.7 26.0 - 34.0 pg   MCHC 35.6 32.0 - 36.0 g/dL   RDW 91.413.3 78.211.5 - 95.614.5 %   Platelets 380 150 - 440 K/uL  Urinalysis, Complete w Microscopic  Result Value Ref Range   Color, Urine YELLOW (A) YELLOW   APPearance CLOUDY (A) CLEAR   Specific Gravity, Urine 1.012 1.005 - 1.030   pH 5.0 5.0 - 8.0   Glucose, UA NEGATIVE NEGATIVE mg/dL   Hgb urine dipstick NEGATIVE NEGATIVE   Bilirubin Urine NEGATIVE NEGATIVE   Ketones, ur NEGATIVE NEGATIVE mg/dL   Protein, ur NEGATIVE NEGATIVE mg/dL   Nitrite POSITIVE (A) NEGATIVE   Leukocytes, UA NEGATIVE NEGATIVE   RBC / HPF 0-5 0 - 5 RBC/hpf   WBC, UA 0-5 0 - 5 WBC/hpf   Bacteria, UA RARE (A) NONE SEEN   Squamous Epithelial / LPF 6-30 (A) NONE SEEN   Mucous PRESENT   Pregnancy, urine POC  Result Value Ref Range   Preg Test, Ur NEGATIVE NEGATIVE   No results found.

## 2017-06-29 NOTE — ED Notes (Signed)
Pt unable to void at this time; pt given labeled urine specimen cup.

## 2017-06-29 NOTE — ED Provider Notes (Signed)
[facility] Emergency Department Provider Note  ____________________________________________  Time seen: Approximately 12:23 PM  I have reviewed the triage vital signs and the nursing notes.   HISTORY  Chief Complaint Nausea; Dizziness; and Abdominal Pain    HPI Carla Ingram is a 21 y.o. female who complains of nausea for 2 days and syncope last night. She also reports that she's had difficulty eating for the last 2 days. Specifically, 2 days ago she tried having chicken Alfredo and felt like she couldn't tolerate it. This morning she tried to eat beef-a-roni for breakfastand felt too nauseated. She denies trying any simpler foods like toast or cereal. Feels like this is similar to when she was pregnant in the past. She also has dysuria and suprapubic pain which is nonradiating, moderate intensity, colicky. No alleviating factors.  She is worried that she could have a pregnancy with a negative pregnancy test because she is on a Depo-Provera shot and during her last pregnancy they had to diagnose her with a blood test. Review of records confirms that early in pregnancy she had a positive urine test. Also reports that she was treated for UTI 2 months ago. She doesn't remember which abx they gave her. Review of records shows that she had a pansensitive Escherichia coli infection by culture, and they treated her with Bactrim.     Past Medical History:  Diagnosis Date  . Anemia   . GERD (gastroesophageal reflux disease)   . Placental abruption    2012 at 35 wks  . Positive pregnancy test   . Psoriasis   . TMJ (dislocation of temporomandibular joint)      Patient Active Problem List   Diagnosis Date Noted  . UTI (urinary tract infection) 04/25/2017  . History of 2 cesarean sections 07/09/2016  . B12 deficiency 11/01/2015  . Tobacco use disorder 11/01/2015  . 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 Other psoriasis 02/26/2010     Past Surgical  History:  Procedure Laterality Date  . CESAREAN SECTION  2012, 2016  . CESAREAN SECTION N/A 07/09/2016   Procedure: CESAREAN SECTION;  Surgeon: Nadara Mustard, MD;  Location: [facility] ORS;  Service: Obstetrics;  Laterality: N/A;  . DILATION AND CURETTAGE OF UTERUS  2009     Prior to Admission medications   Medication Sig Start Date End Date Taking? Authorizing Provider  cyclobenzaprine (FLEXERIL) 5 MG tablet Take 1 tablet (5 mg total) by mouth 3 (three) times daily as needed for muscle spasms. 05/18/17   Jeanmarie Plant, MD  medroxyPROGESTERone (DEPO-PROVERA) 150 MG/ML injection Inject 1 mL (150 mg total) into the muscle once. 07/11/16   Tresea Mall, CNM  naproxen (NAPROSYN) 375 MG tablet Take 1 tablet (375 mg total) by mouth 2 (two) times daily with a meal. 05/18/17 05/18/18  Jeanmarie Plant, MD  nitrofurantoin (MACRODANTIN) 100 MG capsule Take 1 capsule (100 mg total) by mouth 2 (two) times daily. 06/29/17   Sharman Cheek, MD  ondansetron (ZOFRAN ODT) 4 MG disintegrating tablet Take 1 tablet (4 mg total) by mouth every 8 (eight) hours as needed for nausea or vomiting. 06/29/17   Sharman Cheek, MD  sulfamethoxazole-trimethoprim (BACTRIM DS,SEPTRA DS) 800-160 MG tablet Take 1 tablet by mouth 2 (two) times daily. For 3 days 04/25/17   Nadara Mustard, MD     Allergies Augmentin [amoxicillin-pot clavulanate]; Cephalexin; and Penicillins   Family History  Problem Relation Age of Onset  . CVA Mother     Social History Social  History  Substance Use Topics  . Smoking status: Current Every Day Smoker    Packs/day: 0.50    Years: 11.00    Types: Cigarettes    Start date: 10/31/2004  . Smokeless tobacco: Never Used  . Alcohol use No    Review of Systems  Constitutional:   No fever or chills.  ENT:   No sore throat. No rhinorrhea. Cardiovascular:   No chest pain , Positive syncope last night. Respiratory:   No dyspnea or cough. Gastrointestinal:   Positive for suprapubic pain as  above, without vomiting and diarrhea.  Musculoskeletal:   Negative for focal pain or swelling All other systems reviewed and are negative except as documented above in ROS and HPI.  ____________________________________________   PHYSICAL EXAM:  VITAL SIGNS: ED Triage Vitals  Enc Vitals Group     BP 06/29/17 1057 131/76     Pulse Rate 06/29/17 1057 96     Resp 06/29/17 1057 18     Temp 06/29/17 1057 98.7 F (37.1 C)     Temp Source 06/29/17 1057 Oral     SpO2 06/29/17 1057 99 %     Weight 06/29/17 1100 170 lb (77.1 kg)     Height 06/29/17 1100 5\' 1"  (1.549 m)     Head Circumference --      Peak Flow --      Pain Score 06/29/17 1101 7     Pain Loc --      Pain Edu? --      Excl. in GC? --     Vital signs reviewed, nursing assessments reviewed.   Constitutional:   Alert and oriented. Well appearing and in no distress. Eyes:   No scleral icterus.  EOMI. No nystagmus. No conjunctival pallor. PERRL. ENT   Head:   Normocephalic and atraumatic.   Nose:   No congestion/rhinnorhea.    Mouth/Throat:   MMM, no pharyngeal erythema. No peritonsillar mass.    Neck:   No meningismus. Full ROM Hematological/Lymphatic/Immunilogical:   No cervical lymphadenopathy. Cardiovascular:   RRR. Symmetric bilateral radial and DP pulses.  No murmurs.  Respiratory:   Normal respiratory effort without tachypnea/retractions. Breath sounds are clear and equal bilaterally. No wheezes/rales/rhonchi. Gastrointestinal:   Soft with suprapubic tenderness. Non distended. There is no CVA tenderness.  No rebound, rigidity, or guarding. Genitourinary:   deferred Musculoskeletal:   Normal range of motion in all extremities. No joint effusions.  No lower extremity tenderness.  No edema. Neurologic:   Normal speech and language.  Motor grossly intact. No gross focal neurologic deficits are appreciated.  Skin:    Skin is warm, dry and intact. No rash noted.  No petechiae, purpura, or  bullae.  ____________________________________________    LABS (pertinent positives/negatives) (all labs ordered are listed, but only abnormal results are displayed) Labs Reviewed  COMPREHENSIVE METABOLIC PANEL - Abnormal; Notable for the following:       Result Value   Potassium 3.2 (*)    All other components within normal limits  URINALYSIS, COMPLETE (UACMP) WITH MICROSCOPIC - Abnormal; Notable for the following:    Color, Urine YELLOW (*)    APPearance CLOUDY (*)    Nitrite POSITIVE (*)    Bacteria, UA RARE (*)    Squamous Epithelial / LPF 6-30 (*)    All other components within normal limits  LIPASE, BLOOD  CBC  POC URINE PREG, ED  POCT PREGNANCY, URINE   ____________________________________________   EKG  Interpreted by me Normal sinus rhythm  rate of 93, normal axis and intervals. Normal QRS and ST segments. T wave inversions in 3 aVF and V3 through V5, not significant change from 05/20/2017.  ____________________________________________    RADIOLOGY  No results found.  ____________________________________________   PROCEDURES Procedures  ____________________________________________   INITIAL IMPRESSION / ASSESSMENT AND PLAN / ED COURSE  Pertinent labs & imaging results that were available during my care of the patient were reviewed by me and considered in my medical decision making (see chart for details).  Patient presents with signs and symptoms of cystitis. Urinalysis shows nitrite positive UTI. We'll send a urine culture. Treat with Macrobid and Zofran for her nausea, counseled on oral hydration and following a simple diet until she feels better.Considering the patient's symptoms, medical history, and physical examination today, I have low suspicion for cholecystitis or biliary pathology, pancreatitis, perforation or bowel obstruction, hernia, intra-abdominal abscess, AAA or dissection, volvulus or intussusception, mesenteric ischemia, or  appendicitis.  Low suspicion of STI PID TOA or torsion. The patient is not pregnant.      ____________________________________________   FINAL CLINICAL IMPRESSION(S) / ED DIAGNOSES  Final diagnoses:  Cystitis  Nausea      New Prescriptions   NITROFURANTOIN (MACRODANTIN) 100 MG CAPSULE    Take 1 capsule (100 mg total) by mouth 2 (two) times daily.   ONDANSETRON (ZOFRAN ODT) 4 MG DISINTEGRATING TABLET    Take 1 tablet (4 mg total) by mouth every 8 (eight) hours as needed for nausea or vomiting.     Portions of this note were generated with dragon dictation software. Dictation errors may occur despite best attempts at proofreading.    Sharman Cheek, MD 06/29/17 1227

## 2017-06-29 NOTE — ED Notes (Signed)

## 2017-07-01 LAB — URINE CULTURE: Culture: >=100000 — AB

## 2017-07-12 ENCOUNTER — Encounter: Payer: Self-pay | Admitting: Emergency Medicine

## 2017-07-12 ENCOUNTER — Emergency Department: Payer: Medicaid Other

## 2017-07-12 ENCOUNTER — Emergency Department
Admission: EM | Admit: 2017-07-12 | Discharge: 2017-07-12 | Disposition: A | Discharge status: Home / Self Care | Payer: Medicaid Other | Attending: Emergency Medicine | Admitting: Emergency Medicine

## 2017-07-12 DIAGNOSIS — Y9302 Activity, running: Secondary | ICD-10-CM | POA: Insufficient documentation

## 2017-07-12 DIAGNOSIS — Y998 Other external cause status: Secondary | ICD-10-CM | POA: Diagnosis not present

## 2017-07-12 DIAGNOSIS — Y9289 Other specified places as the place of occurrence of the external cause: Secondary | ICD-10-CM | POA: Insufficient documentation

## 2017-07-12 DIAGNOSIS — F17210 Nicotine dependence, cigarettes, uncomplicated: Secondary | ICD-10-CM | POA: Diagnosis not present

## 2017-07-12 DIAGNOSIS — W1842XA Slipping, tripping and stumbling without falling due to stepping into hole or opening, initial encounter: Secondary | ICD-10-CM | POA: Diagnosis not present

## 2017-07-12 DIAGNOSIS — S93402A Sprain of unspecified ligament of left ankle, initial encounter: Secondary | ICD-10-CM | POA: Diagnosis not present

## 2017-07-12 DIAGNOSIS — S99912A Unspecified injury of left ankle, initial encounter: Secondary | ICD-10-CM | POA: Diagnosis present

## 2017-07-12 MED ORDER — IBUPROFEN 600 MG PO TABS
600.0000 mg | ORAL_TABLET | Freq: Once | ORAL | Status: AC
  Administered 2017-07-12: 600 mg via ORAL
  Filled 2017-07-12: qty 1

## 2017-07-12 NOTE — Discharge Instructions (Signed)
As we discussed, you do not have any broken or dislocated bones in your foot or ankle, but you do have an ankle sprain.  Please read through the included information about routine injury care (RICE = rest, ice, compression, elevation), and take over-the-counter pain medicine according to label instructions.  If you do not have any reason to avoid ibuprofen, you can also consider taking ibuprofen 600 mg 3 times a day with meals, but do this for no more than 5 days as it may cause to some stomach discomfort over time.  Use crutches if provided and you may bear weight as tolerated.  Follow-up is recommended with the orthopedic surgeon or with your regular doctor.  

## 2017-07-12 NOTE — ED Triage Notes (Signed)
Reports slipped in a hole.  Patient reports left ankle pain.

## 2017-07-12 NOTE — ED Notes (Signed)
Report to ashley, rn

## 2017-07-12 NOTE — ED Provider Notes (Signed)
[facility] Emergency Department Provider Note  ____________________________________________   None    (approximate)  I have reviewed the triage vital signs and the nursing notes.   HISTORY  Chief Complaint Ankle Pain    HPI Carla Ingram is a 21 y.o. female with medical history as listed below and presents for evaluation of acute onset of pain in her left ankle after slipping on a hold.  Reportedly she was running outside at approximately 3:30 in the morning to try to help out some friends when she stepped in a hole and rolled her ankle.  She did not injure herself in any other way.  She reports the pain is severe and much worse when she attempts to bear weight on the foot.  There is some mild swelling mostly notable on the outside of the ankle.  She has no numbness or tingling.  She does not have any pain in any other part of her leg.  She has previously fractured that ankle years ago.  Past Medical History:  Diagnosis Date  . Anemia   . GERD (gastroesophageal reflux disease)   . Placental abruption    2012 at 35 wks  . Positive pregnancy test   . Psoriasis   . TMJ (dislocation of temporomandibular joint)     Patient Active Problem List   Diagnosis Date Noted  . UTI (urinary tract infection) 04/25/2017  . History of 2 cesarean sections 07/09/2016  . B12 deficiency 11/01/2015  . Tobacco use disorder 11/01/2015  . 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 Other psoriasis 02/26/2010    Past Surgical History:  Procedure Laterality Date  . CESAREAN SECTION  2012, 2016  . CESAREAN SECTION N/A 07/09/2016   Procedure: CESAREAN SECTION;  Surgeon: Nadara Mustard, MD;  Location: [facility] ORS;  Service: Obstetrics;  Laterality: N/A;  . DILATION AND CURETTAGE OF UTERUS  2009    Prior to Admission medications   Medication Sig Start Date End Date Taking? Authorizing Provider  cyclobenzaprine (FLEXERIL) 5 MG tablet Take 1 tablet (5 mg total) by mouth  3 (three) times daily as needed for muscle spasms. 05/18/17   Jeanmarie Plant, MD  medroxyPROGESTERone (DEPO-PROVERA) 150 MG/ML injection Inject 1 mL (150 mg total) into the muscle once. 07/11/16   Tresea Mall, CNM  naproxen (NAPROSYN) 375 MG tablet Take 1 tablet (375 mg total) by mouth 2 (two) times daily with a meal. 05/18/17 05/18/18  Jeanmarie Plant, MD  nitrofurantoin (MACRODANTIN) 100 MG capsule Take 1 capsule (100 mg total) by mouth 2 (two) times daily. 06/29/17   Sharman Cheek, MD  ondansetron (ZOFRAN ODT) 4 MG disintegrating tablet Take 1 tablet (4 mg total) by mouth every 8 (eight) hours as needed for nausea or vomiting. 06/29/17   Sharman Cheek, MD  sulfamethoxazole-trimethoprim (BACTRIM DS,SEPTRA DS) 800-160 MG tablet Take 1 tablet by mouth 2 (two) times daily. For 3 days 04/25/17   Nadara Mustard, MD    Allergies Augmentin [amoxicillin-pot clavulanate]; Cephalexin; and Penicillins  Family History  Problem Relation Age of Onset  . CVA Mother     Social History Social History  Substance Use Topics  . Smoking status: Current Every Day Smoker    Packs/day: 0.50    Years: 11.00    Types: Cigarettes    Start date: 10/31/2004  . Smokeless tobacco: Never Used  . Alcohol use No    Review of Systems Constitutional: No fever/chills Respiratory: Denies shortness of breath. Gastrointestinal: No abdominal pain.  No nausea, no vomiting.   Musculoskeletal: severe pain in left ankle.  Negative for neck pain.  Negative for back pain. Integumentary: Negative for lacerations Neurological: Negative for headaches, focal weakness or numbness.   ____________________________________________   PHYSICAL EXAM:  VITAL SIGNS: ED Triage Vitals  Enc Vitals Group     BP 07/12/17 0617 (!) 106/59     Pulse Rate 07/12/17 0617 (!) 105     Resp 07/12/17 0617 20     Temp 07/12/17 0617 98.7 F (37.1 C)     Temp Source 07/12/17 0617 Oral     SpO2 07/12/17 0617 100 %     Weight --       Height --      Head Circumference --      Peak Flow --      Pain Score 07/12/17 0614 8     Pain Loc --      Pain Edu? --      Excl. in GC? --     Constitutional: Alert and oriented. Well appearing and in no acute distress. Eyes: Conjunctivae are normal.  Head: Atraumatic. Mouth/Throat: Very poor dentition.  Mucous membranes are moist. Cardiovascular: Normal rate, regular rhythm. Good peripheral circulation.  Respiratory: Normal respiratory effort.  No retractions.  Musculoskeletal: Swelling most notable around the lateral malleolus of the left ankle.  Severe tenderness to  palpation of the entire ankle.  Pain with any attempts at passive range of motion of the foot and ankle.  No other injuries are evident.   Neurologic:  Normal speech and language. No gross focal neurologic deficits are appreciated.  Skin:  Skin is warm, dry and intact. No rash noted. Psychiatric: Mood and affect are normal. Speech and behavior are normal.  ____________________________________________   LABS (all labs ordered are listed, but only abnormal results are displayed)  Labs Reviewed - No data to display ____________________________________________  EKG  None - EKG not ordered by ED physician ____________________________________________  RADIOLOGY   Dg Ankle Complete Left  Result Date: 07/12/2017 CLINICAL DATA:  Left ankle pain after twisting injury, slipped in a hole. EXAM: LEFT ANKLE COMPLETE - 3+ VIEW COMPARISON:  Radiographs 04/17/2010 FINDINGS: There is no evidence of fracture, dislocation, or joint effusion. There is no evidence of arthropathy or other focal bone abnormality. Mild lateral soft tissue edema. IMPRESSION: Soft tissue edema without acute osseous abnormality. Electronically Signed   By: Rubye Oaks M.D.   On: 07/12/2017 06:48    ____________________________________________   PROCEDURES  Critical Care performed: No   Procedure(s) performed:    Procedures   ____________________________________________   INITIAL IMPRESSION / ASSESSMENT AND PLAN / ED COURSE  Pertinent labs & imaging results that were available during my care of the patient were reviewed by me and considered in my medical decision making (see chart for details).   sprain versus fracture, awaiting radiographs.  No other evidence of trauma.      ____________________________________________  FINAL CLINICAL IMPRESSION(S) / ED DIAGNOSES  Final diagnoses:  Sprain of left ankle, unspecified ligament, initial encounter     MEDICATIONS GIVEN DURING THIS VISIT:  Medications  ibuprofen (ADVIL,MOTRIN) tablet 600 mg (600 mg Oral Given 07/12/17 0727)     NEW OUTPATIENT MEDICATIONS STARTED DURING THIS VISIT:  New Prescriptions   No medications on file    Modified Medications   No medications on file    Discontinued Medications   No medications on file     Note:  This document was prepared using  Dragon [employer] engineervoice recognition software and may include unintentional dictation errors.    Loleta RoseForbach, Teryn Gust, MD 07/12/17 [phone]

## 2017-07-12 NOTE — ED Notes (Signed)
Ice applied

## 2017-07-14 ENCOUNTER — Ambulatory Visit: Payer: Medicaid Other

## 2017-10-14 ENCOUNTER — Other Ambulatory Visit: Payer: Self-pay | Admitting: Advanced Practice Midwife

## 2017-11-26 ENCOUNTER — Ambulatory Visit (INDEPENDENT_AMBULATORY_CARE_PROVIDER_SITE_OTHER): Payer: Medicaid Other | Admitting: Family Medicine

## 2017-11-26 VITALS — BP 130/90 | HR 89 | Resp 14 | Ht 61.0 in | Wt 159.6 lb

## 2017-11-26 DIAGNOSIS — L408 Other psoriasis: Secondary | ICD-10-CM

## 2017-11-26 DIAGNOSIS — F1010 Alcohol abuse, uncomplicated: Secondary | ICD-10-CM

## 2017-11-26 DIAGNOSIS — R030 Elevated blood-pressure reading, without diagnosis of hypertension: Secondary | ICD-10-CM

## 2017-11-26 DIAGNOSIS — Z3009 Encounter for other general counseling and advice on contraception: Secondary | ICD-10-CM

## 2017-11-26 DIAGNOSIS — Z3200 Encounter for pregnancy test, result unknown: Secondary | ICD-10-CM | POA: Diagnosis not present

## 2017-11-26 LAB — POCT URINE PREGNANCY: PREG TEST UR: NEGATIVE

## 2017-11-26 NOTE — Progress Notes (Signed)
Name: Carla Ingram   MRN: [id number]    DOB: 03-Feb-1996   Date:11/26/2017       Progress Note  Subjective  Chief Complaint  Chief Complaint  Patient presents with  . Possible Pregnancy  . Hypertension    HPI  Pregnancy test: last Dep was 03/2017 but [place]. She has not had a cycle since. She has been sexually active and has noticed some butterfly sensation on her lower abdomen, feels hungry all the time and has craving, pregnancy test at home 7 tests negative and a faintly positive test last week. She already scheduled follow up with OB for tomorrow and will have an US. She has noticed some right side cramping, but no bleeding.   Psoriasis: she states skin has been doing well, usually severe symptoms this time of the year, but doing well, not on medications.   B12: discussed supplementation but she states she is not interested  Contraception; discussed other options to increase compliance, Nexplanon or IUD she will discuss with gyn tomorrow  Patient Active Problem List   Diagnosis Date Noted  . History of 3 cesarean sections 07/09/2016  . B12 deficiency 11/01/2015  . Tobacco use disorder 11/01/2015  . 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 Other psoriasis 02/26/2010    Past Surgical History:  Procedure Laterality Date  . CESAREAN SECTION  2012, 2016  . CESAREAN SECTION N/A 07/09/2016   Procedure: CESAREAN SECTION;  Surgeon: Nadara Mustardobert P Harris, MD;  Location: [facility] ORS;  Service: Obstetrics;  Laterality: N/A;  . DILATION AND CURETTAGE OF UTERUS  2009    Family History  Problem Relation Age of Onset  . CVA Mother   . Stroke Mother        during pregnancy    Social History   Socioeconomic History  . Marital status: Single    Spouse name: Not on file  . Number of children: 3  . Years of education: Not on file  . Highest education level: 9th grade  Social Needs  . Financial resource  strain: Not on file  . Food insecurity - worry: Not on file  . Food insecurity - inability: Not on file  . Transportation needs - medical: Not on file  . Transportation needs - non-medical: Not on file  Occupational History  . Occupation: unemployed   Tobacco Use  . Smoking status: Current Every Day Smoker    Packs/day: 0.50    Years: 11.00    Pack years: 5.50    Types: Cigarettes    Start date: 10/31/2004  . Smokeless tobacco: Never Used  Substance and Sexual Activity  . Alcohol use: Yes    Alcohol/week: 4.8 oz    Types: 8 Shots of liquor per week    Comment: she binge drinks when has a weekend without kids - rum  . Drug use: No  . Sexual activity: Yes    Partners: Male  Other Topics Concern  . Not on file  Social History Narrative   Lives with her cousin ( raised her) , three daughter's - first one from a different father.     No current outpatient medications on file.  Allergies  Allergen Reactions  . Augmentin [Amoxicillin-Pot Clavulanate] Hives and Swelling  . Cephalexin Other (See Comments)    Achy in bones Achy in bones  . Penicillins Hives and Swelling     ROS  Constitutional: Negative for fever ,  positive for  weight change.  Respiratory: Negative for cough and shortness of breath.   Cardiovascular: Negative for chest pain or palpitations.  Gastrointestinal: Negative for abdominal pain, no bowel changes.  Musculoskeletal: Negative for gait problem or joint swelling.  Skin: Positive  for rash.  Neurological: Negative for dizziness or headache.  No other specific complaints in a complete review of systems (except as listed in HPI above).  Objective  Vitals:   11/26/17 1455  BP: 130/90  Pulse: 89  Resp: 14  SpO2: 99%  Weight: 159 lb 9.6 oz (72.4 kg)  Height: 5\' 1"  (1.549 m)    Body mass index is 30.16 kg/m.  Physical Exam  Constitutional: Patient appears well-developed and well-nourished. Obese  No distress.  HEENT: head atraumatic,  normocephalic, pupils equal and reactive to light, neck supple, throat within normal limits Cardiovascular: Normal rate, regular rhythm and normal heart sounds.  No murmur heard. No BLE edema. Pulmonary/Chest: Effort normal and breath sounds normal. No respiratory distress. Abdominal: Soft.  There is no tenderness. Skin: psoriatic plaques worse on her legs.  Psychiatric: Patient has a normal mood and affect. behavior is normal. Judgment and thought content normal.  Recent Results (from the past 2160 hour(s))  POCT urine pregnancy     Status: Normal   Collection Time: 11/26/17  3:10 PM  Result Value Ref Range   Preg Test, Ur Negative Negative   PHQ2/9: [facility] 02/24/2017 11/01/2015  Decreased Interest 0 0  Down, Depressed, Hopeless 0 0  PHQ - 2 Score 0 0    Fall Risk: Fall Risk  11/26/2017 02/24/2017 11/01/2015  Falls in the past year? No No No    Functional Status Survey: Is the patient deaf or have difficulty hearing?: No Does the patient have difficulty seeing, even when wearing glasses/contacts?: No Does the patient have difficulty concentrating, remembering, or making decisions?: No Does the patient have difficulty walking or climbing stairs?: No Does the patient have difficulty dressing or bathing?: No Does the patient have difficulty doing errands alone such as visiting a doctor's office or shopping?: No    Assessment & Plan  1. Other psoriasis  Doing well at this time  2. Possible pregnancy  - POCT urine pregnancy - negative  3. Alcohol consumption binge drinking  Discussed cutting down on alcohol consumption  4. Elevated blood pressure reading  Discussed importance of checking as an outpatient   5. Family planning advice  She will follow up with ob tomorrow

## 2017-11-27 ENCOUNTER — Encounter: Payer: Self-pay | Admitting: Maternal Newborn

## 2017-11-27 ENCOUNTER — Ambulatory Visit (INDEPENDENT_AMBULATORY_CARE_PROVIDER_SITE_OTHER): Payer: Medicaid Other | Admitting: Maternal Newborn

## 2017-11-27 VITALS — BP 100/60 | HR 72 | Ht 62.0 in | Wt 160.0 lb

## 2017-11-27 DIAGNOSIS — N912 Amenorrhea, unspecified: Secondary | ICD-10-CM | POA: Diagnosis not present

## 2017-11-27 NOTE — Progress Notes (Signed)
Obstetrics & Gynecology Office Visit   Chief Complaint:  Chief Complaint  Patient presents with  . Follow-up    five neg preg test, then one positive, the last one was neg, [facility] did a urine preg test yesterday which was neg    History of Present Illness: Patient was on DepoProvera; her last shot in was in April 2018. She has had some symptoms that lead her to suspect pregnancy, including fatigue, increased appetite, uterine cramping, and lessening in the severity of her psoriasis (which happened in her previous pregnancies). She has not had a menstrual cycle since she stopped DepoProvera. She denies any vaginal bleeding. She has taken several urine pregnancy tests, one of which was weakly positive. Her last UPT was taken yesterday during an appointment at her primary care provider's office, and it was negative.  Review of Systems: 10 point review of systems negative unless otherwise noted in HPI  Past Medical History:  Past Medical History:  Diagnosis Date  . Anemia   . GERD (gastroesophageal reflux disease)   . Migraine   . Placental abruption    2012 at 35 wks  . Positive pregnancy test   . Psoriasis   . TMJ (dislocation of temporomandibular joint)     Past Surgical History:  Past Surgical History:  Procedure Laterality Date  . CESAREAN SECTION  2012, 2016  . CESAREAN SECTION N/A 07/09/2016   Procedure: CESAREAN SECTION;  Surgeon: Nadara Mustardobert P Harris, MD;  Location: [facility] ORS;  Service: Obstetrics;  Laterality: N/A;  . DILATION AND CURETTAGE OF UTERUS  2009    Gynecologic History: No LMP recorded. Patient has had an injection.  Obstetric History: [id number]  Family History:  Family History  Problem Relation Age of Onset  . CVA Mother   . Stroke Mother        during pregnancy    Social History:  Social History   Socioeconomic History  . Marital status: Single    Spouse name: Not on file  . Number of children: 3  . Years of education: Not on file  . Highest  education level: 9th grade  Social Needs  . Financial resource strain: Not on file  . Food insecurity - worry: Not on file  . Food insecurity - inability: Not on file  . Transportation needs - medical: Not on file  . Transportation needs - non-medical: Not on file  Occupational History  . Occupation: unemployed   Tobacco Use  . Smoking status: Current Every Day Smoker    Packs/day: 0.50    Years: 11.00    Pack years: 5.50    Types: Cigarettes    Start date: 10/31/2004  . Smokeless tobacco: Never Used  Substance and Sexual Activity  . Alcohol use: Yes    Alcohol/week: 4.8 oz    Types: 8 Shots of liquor per week    Comment: she binge drinks when has a weekend without kids - rum  . Drug use: No  . Sexual activity: No    Partners: Male    Birth control/protection: None  Other Topics Concern  . Not on file  Social History Narrative   Lives with her cousin ( raised her) , three daughter's - first one from a different father.     Allergies:  Allergies  Allergen Reactions  . Augmentin [Amoxicillin-Pot Clavulanate] Hives and Swelling  . Cephalexin Other (See Comments)    Achy in bones Achy in bones  . Penicillins Hives and Swelling  Medications: Prior to Admission medications   Not on File    Physical Exam Vitals:  Vitals:   11/27/17 0944  BP: 100/60  Pulse: 72   No LMP recorded. Patient has had an injection.  General: NAD Pulmonary: No increased work of breathing Neurologic: Grossly intact Psychiatric: mood appropriate, affect full  Assessment: 21 y.o. [id number] with amenorrhea and possible pregnancy.  Plan: Problem List Items Addressed This Visit    None    Visit Diagnoses    Amenorrhea    -  Primary   Relevant Orders   Beta HCG, Quant     Beta hCG ordered. Explained to patient that if it returns with a positive result, we will need to have another measurement to determine whether it is a viable pregnancy vs. early miscarriage. Will call patient with  results.  A total of 10 minutes were spent in face-to-face contact with the patient during this encounter with over half of that time devoted to counseling and coordination of care.  Marcelyn BruinsJacelyn Sabien Umland, CNM 11/27/2017  10:02 AM

## 2017-11-28 LAB — BETA HCG QUANT (REF LAB): hCG Quant: <1 m[IU]/mL

## 2017-12-02 ENCOUNTER — Telehealth: Payer: Self-pay | Admitting: Maternal Newborn

## 2017-12-02 NOTE — Telephone Encounter (Signed)
Informed patient of negative beta hCG result.  [name], CNM 12/02/2017  3:49 PM

## 2018-01-28 ENCOUNTER — Ambulatory Visit: Payer: Medicaid Other

## 2018-03-26 ENCOUNTER — Encounter: Payer: Self-pay | Admitting: Emergency Medicine

## 2018-03-26 ENCOUNTER — Emergency Department
Admission: EM | Admit: 2018-03-26 | Discharge: 2018-03-26 | Disposition: A | Discharge status: Home / Self Care | Payer: Medicaid Other | Attending: Emergency Medicine | Admitting: Emergency Medicine

## 2018-03-26 DIAGNOSIS — N3000 Acute cystitis without hematuria: Secondary | ICD-10-CM | POA: Insufficient documentation

## 2018-03-26 DIAGNOSIS — F17210 Nicotine dependence, cigarettes, uncomplicated: Secondary | ICD-10-CM | POA: Diagnosis not present

## 2018-03-26 DIAGNOSIS — R1030 Lower abdominal pain, unspecified: Secondary | ICD-10-CM | POA: Diagnosis present

## 2018-03-26 LAB — URINALYSIS, COMPLETE (UACMP) WITH MICROSCOPIC
Bilirubin Urine: NEGATIVE
Glucose, UA: NEGATIVE mg/dL
Ketones, ur: NEGATIVE mg/dL
Nitrite: POSITIVE — AB
PROTEIN: NEGATIVE mg/dL
SPECIFIC GRAVITY, URINE: 1.009 (ref 1.005–1.030)
pH: 7 (ref 5.0–8.0)

## 2018-03-26 LAB — CBC
HEMATOCRIT: 40.2 % (ref 35.0–47.0)
Hemoglobin: 13.8 g/dL (ref 12.0–16.0)
MCH: 33.9 pg (ref 26.0–34.0)
MCHC: 34.3 g/dL (ref 32.0–36.0)
MCV: 99 fL (ref 80.0–100.0)
Platelets: 352 10*3/uL (ref 150–440)
RBC: 4.06 MIL/uL (ref 3.80–5.20)
RDW: 14 % (ref 11.5–14.5)
WBC: 9.6 10*3/uL (ref 3.6–11.0)

## 2018-03-26 LAB — COMPREHENSIVE METABOLIC PANEL
ALT: 17 U/L (ref 14–54)
AST: 21 U/L (ref 15–41)
Albumin: 3.9 g/dL (ref 3.5–5.0)
Alkaline Phosphatase: 92 U/L (ref 38–126)
Anion gap: 8 (ref 5–15)
BUN: 11 mg/dL (ref 6–20)
CHLORIDE: 105 mmol/L (ref 101–111)
CO2: 23 mmol/L (ref 22–32)
CREATININE: 0.63 mg/dL (ref 0.44–1.00)
Calcium: 9 mg/dL (ref 8.9–10.3)
GFR calc Af Amer: >60 mL/min (ref >60)
GFR calc non Af Amer: >60 mL/min (ref >60)
Glucose, Bld: 94 mg/dL (ref 65–99)
POTASSIUM: 3.8 mmol/L (ref 3.5–5.1)
SODIUM: 136 mmol/L (ref 135–145)
Total Bilirubin: 0.7 mg/dL (ref 0.3–1.2)
Total Protein: 6.9 g/dL (ref 6.5–8.1)

## 2018-03-26 LAB — LIPASE, BLOOD: Lipase: 28 U/L (ref 11–51)

## 2018-03-26 LAB — POCT PREGNANCY, URINE: PREG TEST UR: NEGATIVE

## 2018-03-26 MED ORDER — SULFAMETHOXAZOLE-TRIMETHOPRIM 800-160 MG PO TABS: 1.0000 | ORAL_TABLET | Freq: Two times a day (BID) | ORAL | 0 refills | Status: DC

## 2018-03-26 MED ORDER — LIDOCAINE HCL (PF) 1 % IJ SOLN
2.1000 mL | Freq: Once | INTRAMUSCULAR | Status: AC
  Administered 2018-03-26: 2.1 mL via INTRADERMAL
  Filled 2018-03-26: qty 5

## 2018-03-26 MED ORDER — CEFTRIAXONE SODIUM 1 G IJ SOLR
500.0000 mg | Freq: Once | INTRAMUSCULAR | Status: AC
  Administered 2018-03-26: 500 mg via INTRAMUSCULAR
  Filled 2018-03-26: qty 10

## 2018-03-26 NOTE — Discharge Instructions (Signed)
Return to the ER if you develop a fever or pain increases.

## 2018-03-26 NOTE — ED Provider Notes (Signed)
[facility] Emergency Department Provider Note  ____________________________________________  Time seen: Approximately 5:46 PM  I have reviewed the triage vital signs and the nursing notes.   HISTORY  Chief Complaint Abdominal Pain    HPI Carla Ingram is a 22 y.o. female who presents to the emergency department for evaluation and treatment of lower abdominal pain. Pain is sharp in the RLQ and radiates to the LLQ. Nausea this morning, but none now.  Patient states that 3 days ago she had a UTI which she treated with AZO.  Patient denies fever or loss of appetite.  No alleviating measures were attempted for this complaint.  Past Medical History:  Diagnosis Date  . Anemia   . GERD (gastroesophageal reflux disease)   . Migraine   . Placental abruption    2012 at 35 wks  . Positive pregnancy test   . Psoriasis   . TMJ (dislocation of temporomandibular joint)     Patient Active Problem List   Diagnosis Date Noted  . History of 3 cesarean sections 07/09/2016  . B12 deficiency 11/01/2015  . Tobacco use disorder 11/01/2015  . 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 Other psoriasis 02/26/2010    Past Surgical History:  Procedure Laterality Date  . CESAREAN SECTION  2012, 2016  . CESAREAN SECTION N/A 07/09/2016   Procedure: CESAREAN SECTION;  Surgeon: Nadara Mustard, MD;  Location: [facility] ORS;  Service: Obstetrics;  Laterality: N/A;  . DILATION AND CURETTAGE OF UTERUS  2009    Prior to Admission medications   Medication Sig Start Date End Date Taking? Authorizing Provider  sulfamethoxazole-trimethoprim (BACTRIM DS,SEPTRA DS) 800-160 MG tablet Take 1 tablet by mouth 2 (two) times daily. 03/26/18   Vernestine Brodhead, Rulon Eisenmenger B, FNP    Allergies Augmentin [amoxicillin-pot clavulanate]; Cephalexin; and Penicillins  Family History  Problem Relation Age of Onset  . CVA Mother   . Stroke Mother        during pregnancy    Social History Social History    Tobacco Use  . Smoking status: Current Every Day Smoker    Packs/day: 0.50    Years: 11.00    Pack years: 5.50    Types: Cigarettes    Start date: 10/31/2004  . Smokeless tobacco: Never Used  Substance Use Topics  . Alcohol use: Yes    Alcohol/week: 4.8 oz    Types: 8 Shots of liquor per week    Comment: she binge drinks when has a weekend without kids - rum  . Drug use: No    Review of Systems Constitutional: Negative for fever. Respiratory: Negative for shortness of breath or cough. Gastrointestinal: Positive for abdominal pain; positive for nausea , negative for vomiting. Genitourinary: Positive for dysuria , negative for vaginal discharge. Musculoskeletal: Negative for back pain. Skin: Intact. ____________________________________________   PHYSICAL EXAM:  VITAL SIGNS: ED Triage Vitals  Enc Vitals Group     BP 03/26/18 1603 133/72     Pulse --      Resp 03/26/18 1603 16     Temp 03/26/18 1603 98.7 F (37.1 C)     Temp Source 03/26/18 1603 Oral     SpO2 03/26/18 1603 97 %     Weight 03/26/18 1606 153 lb (69.4 kg)     Height 03/26/18 1606 5\' 3"  (1.6 m)     Head Circumference --      Peak Flow --      Pain Score 03/26/18 1605 7     Pain  Loc --      Pain Edu? --      Excl. in GC? --     Constitutional: Alert and oriented. Well appearing and in no acute distress. Eyes: Conjunctivae are normal. PERRL. EOMI. Head: Atraumatic. Nose: No congestion/rhinnorhea. Mouth/Throat: Mucous membranes are moist. Respiratory: Normal respiratory effort.  No retractions. Gastrointestinal: Abdomen is diffusely tender over the suprapubic area.  No rebound tenderness is noted in the right lower quadrant.  Bowel sounds are present and active x4 quadrants. Genitourinary: Pelvic exam: not indicated. Musculoskeletal: No extremity tenderness nor edema.  Neurologic:  Normal speech and language. No gross focal neurologic deficits are appreciated. Speech is normal. No gait  instability. Skin:  Skin is warm, dry and intact. No rash noted. Psychiatric: Mood and affect are normal. Speech and behavior are normal.  ____________________________________________   LABS (all labs ordered are listed, but only abnormal results are displayed)  Labs Reviewed  URINALYSIS, COMPLETE (UACMP) WITH MICROSCOPIC - Abnormal; Notable for the following components:      Result Value   Color, Urine AMBER (*)    APPearance CLOUDY (*)    Hgb urine dipstick SMALL (*)    Nitrite POSITIVE (*)    Leukocytes, UA MODERATE (*)    Bacteria, UA MANY (*)    Squamous Epithelial / LPF 0-5 (*)    All other components within normal limits  LIPASE, BLOOD  COMPREHENSIVE METABOLIC PANEL  CBC  POC URINE PREG, ED  POCT PREGNANCY, URINE   ____________________________________________  RADIOLOGY  Not indicated ____________________________________________   PROCEDURES  Procedure(s) performed: None  ____________________________________________  22 year old female presenting to the emergency department for treatment and evaluation of abdominal pain.  Urinalysis is remarkable for acute cystitis.  While here, she received an injection of 1 g of Rocephin and will be given a 5-day prescription for Bactrim.  She was instructed to follow-up with her primary care provider in approximately 10 days to have a recheck of the urine.  She was instructed to return to the ER if she gets a fever or if her pain gets worse.  Differential diagnosis included appendicitis and pyelonephritis.  INITIAL IMPRESSION / ASSESSMENT AND PLAN / ED COURSE  Pertinent labs & imaging results that were available during my care of the patient were reviewed by me and considered in my medical decision making (see chart for details).  ____________________________________________   FINAL CLINICAL IMPRESSION(S) / ED DIAGNOSES  Final diagnoses:  Acute cystitis without hematuria    Note:  This document was prepared using  Dragon voice recognition software and may include unintentional dictation errors.    Chinita Pesterriplett, Kysean Sweet B, FNP 03/27/18 0011    Minna AntisPaduchowski, Kevin, MD 03/27/18 [id number]

## 2018-03-26 NOTE — ED Triage Notes (Signed)
Pt arrived with complaints of lower abdominal pain. Pt states it started this morning and progressively had gotten worse throughout the day. Pt described the pain as a sharp sensation starting in her RLQ and radiates to her LLQ. Nausea was reported to being present this morning but has since dissipated.

## 2018-05-04 ENCOUNTER — Emergency Department
Admission: EM | Admit: 2018-05-04 | Discharge: 2018-05-04 | Disposition: A | Discharge status: Home / Self Care | Payer: Medicaid Other | Attending: Emergency Medicine | Admitting: Emergency Medicine

## 2018-05-04 ENCOUNTER — Encounter: Payer: Self-pay | Admitting: Emergency Medicine

## 2018-05-04 DIAGNOSIS — N390 Urinary tract infection, site not specified: Secondary | ICD-10-CM | POA: Diagnosis not present

## 2018-05-04 DIAGNOSIS — F17210 Nicotine dependence, cigarettes, uncomplicated: Secondary | ICD-10-CM | POA: Diagnosis not present

## 2018-05-04 DIAGNOSIS — R252 Cramp and spasm: Secondary | ICD-10-CM

## 2018-05-04 DIAGNOSIS — Z79899 Other long term (current) drug therapy: Secondary | ICD-10-CM | POA: Insufficient documentation

## 2018-05-04 LAB — URINALYSIS, COMPLETE (UACMP) WITH MICROSCOPIC
Bacteria, UA: NONE SEEN
Bilirubin Urine: NEGATIVE
GLUCOSE, UA: NEGATIVE mg/dL
Hgb urine dipstick: NEGATIVE
Ketones, ur: NEGATIVE mg/dL
Nitrite: NEGATIVE
PH: 7 (ref 5.0–8.0)
Protein, ur: NEGATIVE mg/dL
SPECIFIC GRAVITY, URINE: 1.01 (ref 1.005–1.030)

## 2018-05-04 LAB — POCT PREGNANCY, URINE: Preg Test, Ur: NEGATIVE

## 2018-05-04 MED ORDER — CYCLOBENZAPRINE HCL 10 MG PO TABS
10.0000 mg | ORAL_TABLET | Freq: Once | ORAL | Status: AC
  Administered 2018-05-04: 10 mg via ORAL
  Filled 2018-05-04: qty 1

## 2018-05-04 MED ORDER — ACETAMINOPHEN 500 MG PO TABS
1000.0000 mg | ORAL_TABLET | Freq: Once | ORAL | Status: AC
  Administered 2018-05-04: 1000 mg via ORAL
  Filled 2018-05-04: qty 2

## 2018-05-04 MED ORDER — NITROFURANTOIN MONOHYD MACRO 100 MG PO CAPS
100.0000 mg | ORAL_CAPSULE | Freq: Once | ORAL | Status: AC
  Administered 2018-05-04: 100 mg via ORAL
  Filled 2018-05-04: qty 1

## 2018-05-04 MED ORDER — NITROFURANTOIN MONOHYD MACRO 100 MG PO CAPS: 100.0000 mg | ORAL_CAPSULE | Freq: Two times a day (BID) | ORAL | 0 refills | Status: AC

## 2018-05-04 MED ORDER — CYCLOBENZAPRINE HCL 10 MG PO TABS: 10.0000 mg | ORAL_TABLET | Freq: Three times a day (TID) | ORAL | 0 refills | Status: DC | PRN

## 2018-05-04 NOTE — ED Notes (Signed)
First Nurse Note: pt reports last week she was seen here and treated for UTI, given IM injection and placed on PO abx. UTI cleared up with injection so she didn't take oral. UTI sx's began yesterday so she started the abx and since then has been having muscle spasms. Pt in NAD upon arrival to front dest.

## 2018-05-04 NOTE — ED Triage Notes (Signed)
Pt reports was here last month for a UTI, was given antibiotics and a shot. Pt states shot cleared herup so she didn't take the abx until last night. Pt now with muscle spasms in both legs, back and arms and was told it was an allergic reaction.

## 2018-05-04 NOTE — ED Provider Notes (Signed)
[facility] Emergency Department Provider Note   ____________________________________________   None    (approximate)  I have reviewed the triage vital signs and the nursing notes.   HISTORY  Chief Complaint Rash    HPI Carla Ingram is a 22 y.o. female patient here today for possible UTI and reaction to antibiotics.  Patient was seen a month ago for UTI and was given Rocephin and told to follow-up with Bactrim DS.  Patient filled the prescription for Bactrim but did not take the medication since she started feeling better.  Patient state last night she started having similar symptoms of UTI and decided to take the Bactrim and developed muscle cramps.  Patient believes she is allergic to Bactrim.  Patient states she is allergic to Keflex and had the same problem with muscle cramps and she took this medication.  Patient medical record reveals she has allergic to penicillin.  Patient also requests pregnancy test.  Past Medical History:  Diagnosis Date  . Anemia   . GERD (gastroesophageal reflux disease)   . Migraine   . Placental abruption    2012 at 35 wks  . Positive pregnancy test   . Psoriasis   . TMJ (dislocation of temporomandibular joint)     Patient Active Problem List   Diagnosis Date Noted  . History of 3 cesarean sections 07/09/2016  . B12 deficiency 11/01/2015  . Tobacco use disorder 11/01/2015  . 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 Other psoriasis 02/26/2010    Past Surgical History:  Procedure Laterality Date  . CESAREAN SECTION  2012, 2016  . CESAREAN SECTION N/A 07/09/2016   Procedure: CESAREAN SECTION;  Surgeon: Nadara Mustard, MD;  Location: [facility] ORS;  Service: Obstetrics;  Laterality: N/A;  . DILATION AND CURETTAGE OF UTERUS  2009    Prior to Admission medications   Medication Sig Start Date End Date Taking? Authorizing Provider  cyclobenzaprine (FLEXERIL) 10 MG tablet Take 1 tablet (10 mg total) by mouth 3  (three) times daily as needed. 05/04/18   Joni Reining, PA-C  nitrofurantoin, macrocrystal-monohydrate, (MACROBID) 100 MG capsule Take 1 capsule (100 mg total) by mouth 2 (two) times daily for 7 days. 05/04/18 05/11/18  Joni Reining, PA-C  sulfamethoxazole-trimethoprim (BACTRIM DS,SEPTRA DS) 800-160 MG tablet Take 1 tablet by mouth 2 (two) times daily. 03/26/18   Triplett, Rulon Eisenmenger B, FNP    Allergies Augmentin [amoxicillin-pot clavulanate]; Cephalexin; and Penicillins  Family History  Problem Relation Age of Onset  . CVA Mother   . Stroke Mother        during pregnancy    Social History Social History   Tobacco Use  . Smoking status: Current Every Day Smoker    Packs/day: 0.50    Years: 11.00    Pack years: 5.50    Types: Cigarettes    Start date: 10/31/2004  . Smokeless tobacco: Never Used  Substance Use Topics  . Alcohol use: Yes    Alcohol/week: 4.8 oz    Types: 8 Shots of liquor per week    Comment: she binge drinks when has a weekend without kids - rum  . Drug use: No    Review of Systems Constitutional: No fever/chills Eyes: No visual changes. ENT: No sore throat. Cardiovascular: Denies chest pain. Respiratory: Denies shortness of breath. Gastrointestinal: No abdominal pain.  No nausea, no vomiting.  No diarrhea.  No constipation. Genitourinary: Positive for dysuria. Musculoskeletal: Muscle spasms in arms and legs. Skin: Negative for rash.  Neurological: Negative for headaches, focal weakness or numbness. Allergic/Immunilogical: See medication list  ____________________________________________   PHYSICAL EXAM:  VITAL SIGNS: ED Triage Vitals  Enc Vitals Group     BP 05/04/18 0910 137/77     Pulse Rate 05/04/18 0910 94     Resp 05/04/18 0910 20     Temp 05/04/18 0910 99.2 F (37.3 C)     Temp Source 05/04/18 0910 Oral     SpO2 05/04/18 0910 98 %     Weight 05/04/18 0911 150 lb (68 kg)     Height 05/04/18 0911  (1.549 m)     Head Circumference --        Peak Flow --      Pain Score 05/04/18 0911 8     Pain Loc --      Pain Edu? --      Excl. in GC? --    Constitutional: Alert and oriented. Well appearing and in no acute distress. Cardiovascular: Normal rate, regular rhythm. Grossly normal heart sounds.  Good peripheral circulation. Respiratory: Normal respiratory effort.  No retractions. Lungs CTAB. Gastrointestinal: Soft and nontender. No distention. No abdominal bruits. No CVA tenderness. Genitourinary: Deferred Musculoskeletal: No lower extremity tenderness nor edema.  No joint effusions. Neurologic:  Normal speech and language. No gross focal neurologic deficits are appreciated. No gait instability. Skin:  Skin is warm, dry and intact. No rash noted. Psychiatric: Mood and affect are normal. Speech and behavior are normal.  ____________________________________________   LABS (all labs ordered are listed, but only abnormal results are displayed)  Labs Reviewed  URINALYSIS, COMPLETE (UACMP) WITH MICROSCOPIC - Abnormal; Notable for the following components:      Result Value   Color, Urine YELLOW (*)    APPearance CLEAR (*)    Leukocytes, UA MODERATE (*)    Non Squamous Epithelial 0-5 (*)    All other components within normal limits  POC URINE PREG, ED  POCT PREGNANCY, URINE   ____________________________________________  EKG   ____________________________________________  RADIOLOGY  ED MD interpretation:    Official radiology report(s): No results found.  ____________________________________________   PROCEDURES  Procedure(s) performed: None  Procedures  Critical Care performed: No  ____________________________________________   INITIAL IMPRESSION / ASSESSMENT AND PLAN / ED COURSE  As part of my medical decision making, I reviewed the following data within the electronic MEDICAL RECORD NUMBER    Patient presents with dysuria and urinary frequency.  Patient with noncompliance with previous UTI  medication instructions.  Patient labs today reveal UTI.  Discussed with patient that muscle spasm is not allergic reaction to Bactrim.  Patient still refused to take the Bactrim.  Patient given a prescription for Macrobid and Flexeril.  Patient advised follow-up PCP.      ____________________________________________   FINAL CLINICAL IMPRESSION(S) / ED DIAGNOSES  Final diagnoses:  Urinary tract infection without hematuria, site unspecified  Muscle cramps     ED Discharge Orders        Ordered    nitrofurantoin, macrocrystal-monohydrate, (MACROBID) 100 MG capsule  2 times daily     05/04/18 1127    cyclobenzaprine (FLEXERIL) 10 MG tablet  3 times daily PRN     05/04/18 1127       Note:  This document was prepared using Dragon voice recognition software and may include unintentional dictation errors.    Joni Reining, PA-C 05/04/18 1132    Minna Antis, MD 05/05/18 1520

## 2018-05-04 NOTE — ED Notes (Signed)
See triage note  States she was here with possible UTI  Stats she was seen and given a shot but did not take any of the PO antibiotics  She took on yesterday  Now having slight rash and muscle cramps

## 2018-05-04 NOTE — Discharge Instructions (Addendum)
Complete antibiotic therapy.

## 2019-03-22 ENCOUNTER — Telehealth: Payer: Self-pay

## 2019-03-22 NOTE — Telephone Encounter (Signed)
Pt states she has had 4 positive pregnancy test with faint lines, should she schedule a NOB or come in for blood test. She wants [place] to do a pregnancy test, I advised her im sure the test are positive if all 4 looked the same. lmp 02/18/19

## 2019-03-22 NOTE — Telephone Encounter (Signed)
Attempt to reach patient to schedule appointment. Number on file states it is not currently excepting calls.

## 2019-03-22 NOTE — Telephone Encounter (Signed)
No she does not need to come in for blood work she just needs a NOB telephone visit set up

## 2019-03-24 ENCOUNTER — Other Ambulatory Visit (HOSPITAL_COMMUNITY): Payer: Self-pay | Admitting: Family Medicine

## 2019-03-24 LAB — PREGNANCY, URINE: Preg Test, Ur: POSITIVE — AB

## 2019-03-25 ENCOUNTER — Ambulatory Visit (INDEPENDENT_AMBULATORY_CARE_PROVIDER_SITE_OTHER): Payer: Medicaid Other | Admitting: Maternal Newborn

## 2019-03-25 ENCOUNTER — Encounter: Payer: Self-pay | Admitting: Maternal Newborn

## 2019-03-25 ENCOUNTER — Other Ambulatory Visit: Payer: Self-pay

## 2019-03-25 VITALS — Wt 165.0 lb

## 2019-03-25 DIAGNOSIS — F17210 Nicotine dependence, cigarettes, uncomplicated: Secondary | ICD-10-CM

## 2019-03-25 DIAGNOSIS — O99331 Smoking (tobacco) complicating pregnancy, first trimester: Secondary | ICD-10-CM

## 2019-03-25 DIAGNOSIS — Z3A01 Less than 8 weeks gestation of pregnancy: Secondary | ICD-10-CM | POA: Diagnosis not present

## 2019-03-25 DIAGNOSIS — O0990 Supervision of high risk pregnancy, unspecified, unspecified trimester: Secondary | ICD-10-CM

## 2019-03-25 NOTE — Progress Notes (Signed)
03/25/2019   Virtual Visit via Telephone Note  I connected with Carla Ingram on 03/25/19 at  9:35 AM EDT by telephone and verified that I am speaking with the correct person using two identifiers.   I discussed the limitations of performing an evaluation and management service by telephone and the availability of in person appointments. The patient expressed understanding and agreed to proceed.  See note below for virtual New OB visit documentation.   I discussed the assessment and treatment plan with the patient. The patient was provided an opportunity to ask questions and all were answered. The patient agreed with the plan and demonstrated an understanding of the instructions.   The patient was advised to call back or seek an in-person evaluation if needed before her next scheduled appointment.  I provided 21 minutes of non-face-to-face time during this encounter.   Oswaldo Conroy, CNM   New OB Note  Chief Complaint: Desires prenatal care.  Transfer of Care Patient: no  History of Present Illness: Carla Ingram is a 23 y.o. [phone] at [redacted]w[redacted]d based on Patient's last menstrual period was 02/20/2019. with an Estimated Date of Delivery: 11/27/19, with the above CC.   Her periods were: regular periods every month, lasting 4-6 days. She has Negative signs or symptoms of nausea/vomiting of pregnancy. She has Negative signs or symptoms of miscarriage or preterm labor She identifies Negative Zika risk factors for her and her partner On any different medications around the time she conceived/early pregnancy: No  History of varicella: Yes    Review of Systems  Constitutional: Negative.   HENT: Negative.   Eyes: Negative.   Respiratory: Negative for cough and shortness of breath.   Cardiovascular: Negative for chest pain and palpitations.  Gastrointestinal: Negative for nausea and vomiting.  Genitourinary: Positive for frequency.  Musculoskeletal: Negative.   Skin: Negative.    Neurological: Negative.   Endo/Heme/Allergies: Negative.   Psychiatric/Behavioral: Negative.    ROS: Review of systems was otherwise negative, except as stated in the above HPI.  OBGYN History: As per HPI. OB History  Gravida Para Term Preterm AB Living  4 3 2 1   3   SAB TAB Ectopic Multiple Live Births        0 3    # Outcome Date GA Lbr Len/2nd Weight Sex Delivery Anes PTL Lv  4 Current           3 Term 07/09/16 [redacted]w[redacted]d  7 lb 5.5 oz (3.33 kg) F CS-LTranv Spinal  LIV  2 Term 01/19/15 [redacted]w[redacted]d  6 lb 10 oz (3.005 kg) F CS-LTranv Spinal  LIV  1 Preterm 02/02/11 [redacted]w[redacted]d  5 lb 5 oz (2.41 kg) F CS-LTranv Gen  LIV     Complications: Abruptio Placenta    Any issues with any prior pregnancies: yes, placental abruption with G1 with preterm delivery at 72 w Any prior children are healthy, doing well, without any problems or issues: yes History of pap smears: Yes. Last pap smear 09/03/2016. NILM History of STIs: No   Past Medical History: Past Medical History:  Diagnosis Date  . Anemia   . GERD (gastroesophageal reflux disease)   . Migraine   . Placental abruption    2012 at 35 wks  . Positive pregnancy test   . Psoriasis   . TMJ (dislocation of temporomandibular joint)     Past Surgical History: Past Surgical History:  Procedure Laterality Date  . CESAREAN SECTION  2012, 2016  . CESAREAN SECTION N/A 07/09/2016  Procedure: CESAREAN SECTION;  Surgeon: Nadara Mustard, MD;  Location: [facility] ORS;  Service: Obstetrics;  Laterality: N/A;  . DILATION AND CURETTAGE OF UTERUS  2009    Family History:  Family History  Problem Relation Age of Onset  . CVA Mother   . Stroke Mother        during pregnancy   She denies any female cancers, bleeding or blood clotting disorders.  She denies any history of intellectual disability, birth defects or genetic disorders in her or the FOB's history  Social History:  Social History   Socioeconomic History  . Marital status: Single    Spouse name:  Not on file  . Number of children: 3  . Years of education: Not on file  . Highest education level: 9th grade  Occupational History  . Occupation: unemployed   Social Needs  . Financial resource strain: Not on file  . Food insecurity:    Worry: Not on file    Inability: Not on file  . Transportation needs:    Medical: Not on file    Non-medical: Not on file  Tobacco Use  . Smoking status: Current Every Day Smoker    Packs/day: 0.50    Years: 11.00    Pack years: 5.50    Types: Cigarettes    Start date: 10/31/2004  . Smokeless tobacco: Never Used  Substance and Sexual Activity  . Alcohol use: Yes    Alcohol/week: 8.0 standard drinks    Types: 8 Shots of liquor per week    Comment: she binge drinks when has a weekend without kids - rum  . Drug use: No  . Sexual activity: Yes    Partners: Male    Birth control/protection: None  Lifestyle  . Physical activity:    Days per week: Not on file    Minutes per session: Not on file  . Stress: Not on file  Relationships  . Social connections:    Talks on phone: Not on file    Gets together: Not on file    Attends religious service: Not on file    Active member of club or organization: Not on file    Attends meetings of clubs or organizations: Not on file    Relationship status: Not on file  . Intimate partner violence:    Fear of current or ex partner: Not on file    Emotionally abused: Not on file    Physically abused: Not on file    Forced sexual activity: Not on file  Other Topics Concern  . Not on file  Social History Narrative   Lives with her cousin ( raised her) , three daughter's - first one from a different father.    Any cats in the household: no Domestic violence screening is negative.  Allergy: Allergies  Allergen Reactions  . Macrobid [Nitrofurantoin Macrocrystal] Other (See Comments)    Can not move her body, loses all control of arms legs   . Augmentin [Amoxicillin-Pot Clavulanate] Hives and Swelling   . Cephalexin Other (See Comments)    Achy in bones Achy in bones  . Penicillins Hives and Swelling    Current Outpatient Medications:  Current Outpatient Medications:  .  Prenatal Vit-Fe Fumarate-FA (MULTIVITAMIN-PRENATAL) 27-0.8 MG TABS tablet, Take 1 tablet by mouth daily at 12 noon., Disp: , Rfl:    Physical Exam:   Wt 165 lb (74.8 kg)   LMP 02/20/2019   BMI 31.18 kg/m  Body mass index is 31.18 kg/m.  Will complete physical exam at in person visit.  Assessment: Carla Ingram is a 23 y.o. [phone] at [redacted]w[redacted]d based on Patient's last menstrual period was 02/20/2019, with an Estimated Date of Delivery: 11/27/2019, presenting for prenatal care.  Plan:  1) Avoid alcoholic beverages. 2) Patient encouraged not to smoke. Currently smoking 1/2 ppd, has cut down since discovering pregnancy 3) Discontinue the use of all non-medicinal drugs and chemicals.  4) Take prenatal vitamins daily.  5) Seatbelt use advised 6) Nutrition, food safety (fish, cheese advisories, and high nitrite foods) and exercise discussed. 7) Hospital and practice style delivering at [facility] discussed  8) Patient is asked about travel to areas at risk for the Zika virus, and counseled to avoid travel and exposure to mosquitoes or people who may have themselves been exposed to the virus.  9) Discussed COVID 19 precautions and changes to prenatal visit schedule. 10) Genetic Screening, such as with 1st Trimester Screening, cell free fetal DNA, AFP testing, and Ultrasound, as well as with amniocentesis and CVS as appropriate, is discussed with patient. She is considering genetic testing this pregnancy. 11) Ordered ultrasound, GTT, and labs.  Problem list reviewed and updated.  Carla Ingram, CNM [facility], [facility] 03/25/2019

## 2019-03-27 ENCOUNTER — Other Ambulatory Visit: Payer: Self-pay | Admitting: Maternal Newborn

## 2019-03-27 DIAGNOSIS — N390 Urinary tract infection, site not specified: Secondary | ICD-10-CM

## 2019-03-27 MED ORDER — ERYTHROMYCIN 500 MG PO TBEC: 1.0000 | DELAYED_RELEASE_TABLET | Freq: Two times a day (BID) | ORAL | 0 refills | Status: AC

## 2019-03-27 NOTE — Progress Notes (Signed)
Rx for dysuria, lower back pain, symptoms of UTI. Patient to drop off urine culture on Monday to lab in clinic.

## 2019-03-28 DIAGNOSIS — O0990 Supervision of high risk pregnancy, unspecified, unspecified trimester: Secondary | ICD-10-CM | POA: Insufficient documentation

## 2019-03-29 ENCOUNTER — Ambulatory Visit (INDEPENDENT_AMBULATORY_CARE_PROVIDER_SITE_OTHER): Payer: Self-pay

## 2019-03-29 ENCOUNTER — Other Ambulatory Visit: Payer: Self-pay

## 2019-03-29 ENCOUNTER — Other Ambulatory Visit: Payer: Self-pay | Admitting: Obstetrics & Gynecology

## 2019-03-29 ENCOUNTER — Telehealth: Payer: Self-pay

## 2019-03-29 DIAGNOSIS — R300 Dysuria: Secondary | ICD-10-CM

## 2019-03-29 LAB — POCT URINALYSIS DIPSTICK
Bilirubin, UA: NEGATIVE
Blood, UA: NEGATIVE
Glucose, UA: NEGATIVE
Ketones, UA: NEGATIVE
Leukocytes, UA: NEGATIVE
Nitrite, UA: POSITIVE
Protein, UA: NEGATIVE
Spec Grav, UA: 1.01 (ref 1.010–1.025)
Urobilinogen, UA: 1 E.U./dL
pH, UA: 5 (ref 5.0–8.0)

## 2019-03-29 NOTE — Progress Notes (Signed)
Dysuria reported one day Results for orders placed or performed in visit on 03/29/19  POCT urinalysis dipstick  Result Value Ref Range   Color, UA     Clarity, UA     Glucose, UA Negative Negative   Bilirubin, UA neg    Ketones, UA neg    Spec Grav, UA 1.010 1.010 - 1.025   Blood, UA neg    pH, UA 5.0 5.0 - 8.0   Protein, UA Negative Negative   Urobilinogen, UA 1.0 0.2 or 1.0 E.U./dL   Nitrite, UA POSITIVE    Leukocytes, UA Negative Negative   Appearance     Odor     Urine culture sent Azo recommended ABX based on results  Annamarie Major, MD, [facility], [facility] 03/29/2019  10:14 AM

## 2019-03-29 NOTE — Telephone Encounter (Signed)
Pt came in office to drop of urine sample for dysuria, pt was positive for nitrites, can you order urine culture or send in rx?

## 2019-03-29 NOTE — Telephone Encounter (Signed)
See note. Culture ordered. AZO recommended until results to help w dysuria. Antibiotics based on results. Let her know.

## 2019-04-01 ENCOUNTER — Other Ambulatory Visit: Payer: Self-pay | Admitting: Obstetrics & Gynecology

## 2019-04-01 ENCOUNTER — Telehealth: Payer: Self-pay | Admitting: Family Medicine

## 2019-04-01 LAB — URINE CULTURE

## 2019-04-01 MED ORDER — CEPHALEXIN 500 MG PO CAPS: 500.0000 mg | ORAL_CAPSULE | Freq: Four times a day (QID) | ORAL | 2 refills | Status: DC

## 2019-04-01 NOTE — Telephone Encounter (Signed)
Pt. Reports she "may have been exposed to corona virus at work- a customer may have it." Pt. Does not have any symptoms and is pregnant. Instructed that [facility] is currently not testing. She will notify her OB-GYN.

## 2019-04-01 NOTE — Progress Notes (Signed)
Discussed UTI results Despite list of allergies, reports has tolerated Keflex in past well eRx sent Keflex, monitor for allergic sxs and for resolution of dysuria Annamarie Major, MD, [facility], [facility] 04/01/2019  8:07 AM

## 2019-05-06 ENCOUNTER — Encounter: Payer: Medicaid Other | Admitting: Obstetrics and Gynecology

## 2019-05-06 ENCOUNTER — Other Ambulatory Visit: Payer: Medicaid Other

## 2019-05-10 ENCOUNTER — Ambulatory Visit (INDEPENDENT_AMBULATORY_CARE_PROVIDER_SITE_OTHER): Payer: Medicaid Other | Admitting: Obstetrics & Gynecology

## 2019-05-10 ENCOUNTER — Encounter: Payer: Self-pay | Admitting: Obstetrics & Gynecology

## 2019-05-10 ENCOUNTER — Other Ambulatory Visit: Payer: Self-pay

## 2019-05-10 ENCOUNTER — Other Ambulatory Visit (HOSPITAL_COMMUNITY)
Admission: RE | Admit: 2019-05-10 | Discharge: 2019-05-10 | Disposition: A | Discharge status: Home / Self Care | Payer: Medicaid Other | Source: Ambulatory Visit | Attending: Obstetrics and Gynecology | Admitting: Obstetrics and Gynecology

## 2019-05-10 ENCOUNTER — Ambulatory Visit (INDEPENDENT_AMBULATORY_CARE_PROVIDER_SITE_OTHER): Payer: Medicaid Other

## 2019-05-10 VITALS — BP 100/70 | Wt 175.0 lb

## 2019-05-10 DIAGNOSIS — O99331 Smoking (tobacco) complicating pregnancy, first trimester: Secondary | ICD-10-CM

## 2019-05-10 DIAGNOSIS — Z98891 History of uterine scar from previous surgery: Secondary | ICD-10-CM

## 2019-05-10 DIAGNOSIS — Z1379 Encounter for other screening for genetic and chromosomal anomalies: Secondary | ICD-10-CM

## 2019-05-10 DIAGNOSIS — O34219 Maternal care for unspecified type scar from previous cesarean delivery: Secondary | ICD-10-CM

## 2019-05-10 DIAGNOSIS — Z124 Encounter for screening for malignant neoplasm of cervix: Secondary | ICD-10-CM | POA: Diagnosis not present

## 2019-05-10 DIAGNOSIS — O0990 Supervision of high risk pregnancy, unspecified, unspecified trimester: Secondary | ICD-10-CM

## 2019-05-10 DIAGNOSIS — N390 Urinary tract infection, site not specified: Secondary | ICD-10-CM

## 2019-05-10 DIAGNOSIS — F17210 Nicotine dependence, cigarettes, uncomplicated: Secondary | ICD-10-CM

## 2019-05-10 DIAGNOSIS — Z3A11 11 weeks gestation of pregnancy: Secondary | ICD-10-CM

## 2019-05-10 DIAGNOSIS — Z3687 Encounter for antenatal screening for uncertain dates: Secondary | ICD-10-CM | POA: Diagnosis not present

## 2019-05-10 DIAGNOSIS — F17200 Nicotine dependence, unspecified, uncomplicated: Secondary | ICD-10-CM

## 2019-05-10 NOTE — Patient Instructions (Signed)

## 2019-05-10 NOTE — Progress Notes (Signed)
  Subjective  Min nausea. No pain or VB  Objective  BP 100/70   Wt 175 lb (79.4 kg)   LMP 02/20/2019   BMI 33.07 kg/m  General: NAD Pumonary: no increased work of breathing Abdomen: gravid, non-tender Extremities: no edema Psychiatric: mood appropriate, affect full Pelvic: normal external, cervix adnexa; uterus 12 week size anteverted  Assessment  23 y.o. [id number] at [redacted]w[redacted]d by  11/27/2019, by Last Menstrual Period presenting for routine prenatal visit  Plan   Problem List Items Addressed This Visit      Other   Tobacco use disorder   History of 3 cesarean sections   Supervision of high risk pregnancy, antepartum    Other Visit Diagnoses    [redacted] weeks gestation of pregnancy    -  Primary   Screening for cervical cancer       Relevant Orders   Cytology - PAP   Encounter for genetic screening for Down Syndrome       Relevant Orders   MaterniT21 PLUS Core+SCA    Plan CS possible BTL PNV Genetics discussed    Labs today    Glucola in near future  pregnancy 4  Problems (from 02/20/19 to present)    Problem Noted Resolved   Supervision of high risk pregnancy, antepartum 03/28/2019 by Oswaldo Conroy, CNM No   Overview Addendum 05/10/2019  1:42 PM by Nadara Mustard, MD    [facility]inic [facility] Prenatal Labs  Dating [place] Blood type:     Genetic Screen NIPS: Antibody:   Anatomic [place]  Rubella:   Varicella:    GTT Early:               Third trimester:  RPR:     Rhogam  HBsAg:     TDaP vaccine        Flu Shot: HIV:     Baby Food                                GBS:   Contraception Possible BTL. Papers [ ]  Pap: 09/03/2016, NILM  CBB     CS/VBAC CS (PH)   Support Person                  Annamarie Major, MD, [facility], [facility] 05/10/2019  1:43 PM

## 2019-05-11 LAB — URINE DRUG PANEL 7
Amphetamines, Urine: NEGATIVE ng/mL
Barbiturate Quant, Ur: NEGATIVE ng/mL
Benzodiazepine Quant, Ur: NEGATIVE ng/mL
Cannabinoid Quant, Ur: NEGATIVE ng/mL
Cocaine (Metab.): NEGATIVE ng/mL
Opiate Quant, Ur: NEGATIVE ng/mL
PCP Quant, Ur: NEGATIVE ng/mL

## 2019-05-11 LAB — RPR+RH+ABO+RUB AB+AB SCR+CB...
Antibody Screen: NEGATIVE
HIV Screen 4th Generation wRfx: NONREACTIVE
Hematocrit: 37.6 % (ref 34.0–46.6)
Hemoglobin: 13.1 g/dL (ref 11.1–15.9)
Hepatitis B Surface Ag: NEGATIVE
MCH: 32.5 pg (ref 26.6–33.0)
MCHC: 34.8 g/dL (ref 31.5–35.7)
MCV: 93 fL (ref 79–97)
Platelets: 381 10*3/uL (ref 150–450)
RBC: 4.03 x10E6/uL (ref 3.77–5.28)
RDW: 12 % (ref 11.7–15.4)
RPR Ser Ql: NONREACTIVE
Rh Factor: POSITIVE
Rubella Antibodies, IGG: 1.96 index (ref >0.99)
Varicella zoster IgG: 1776 index (ref >165)
WBC: 9.1 10*3/uL (ref 3.4–10.8)

## 2019-05-12 LAB — CYTOLOGY - PAP
Chlamydia: NEGATIVE
Diagnosis: NEGATIVE
Neisseria Gonorrhea: NEGATIVE
Trichomonas: POSITIVE — AB

## 2019-05-12 LAB — URINE CULTURE

## 2019-05-13 ENCOUNTER — Other Ambulatory Visit: Payer: Self-pay | Admitting: Obstetrics & Gynecology

## 2019-05-13 ENCOUNTER — Encounter: Payer: Self-pay | Admitting: Obstetrics & Gynecology

## 2019-05-13 MED ORDER — METRONIDAZOLE 500 MG PO TABS: 2000.0000 mg | ORAL_TABLET | Freq: Once | ORAL | 0 refills | Status: AC

## 2019-05-13 NOTE — Progress Notes (Signed)
Discussed results, treat w Flagyl 2g one dose Pt counseled to address w partner Letter sent to [facility] TOC one month  Annamarie Major, MD, [facility], [facility] 05/13/2019  10:20 AM

## 2019-05-17 LAB — MATERNIT21 PLUS CORE+SCA
Fetal Fraction: 5
Monosomy X (Turner Syndrome): NOT DETECTED
Result (T21): NEGATIVE
Trisomy 13 (Patau syndrome): NEGATIVE
Trisomy 18 (Edwards syndrome): NEGATIVE
Trisomy 21 (Down syndrome): NEGATIVE
XXX (Triple X Syndrome): NOT DETECTED
XXY (Klinefelter Syndrome): NOT DETECTED
XYY (Jacobs Syndrome): NOT DETECTED

## 2019-05-18 ENCOUNTER — Telehealth: Payer: Self-pay

## 2019-05-18 NOTE — Telephone Encounter (Signed)
Pt calling for results.  [phone]  Pt wanted results of genetic test.  Adv negative and it's a boy.

## 2019-05-19 ENCOUNTER — Other Ambulatory Visit: Payer: Medicaid Other

## 2019-05-19 ENCOUNTER — Other Ambulatory Visit: Payer: Self-pay

## 2019-05-19 ENCOUNTER — Other Ambulatory Visit: Payer: Self-pay | Admitting: Maternal Newborn

## 2019-05-19 DIAGNOSIS — O0990 Supervision of high risk pregnancy, unspecified, unspecified trimester: Secondary | ICD-10-CM | POA: Diagnosis not present

## 2019-05-20 LAB — GLUCOSE, 1 HOUR GESTATIONAL: Gestational Diabetes Screen: 67 mg/dL (ref 65–139)

## 2019-05-24 ENCOUNTER — Telehealth: Payer: Self-pay

## 2019-05-24 NOTE — Telephone Encounter (Signed)
Pt calling c question.  [phone]  Pt works at [employer] shifts.  States she has a note stating she is not to stand for prolonged time and not to lift over 10lbs.  Pt states she is high risk.  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  Pt will p/u note.

## 2019-05-24 NOTE — Telephone Encounter (Signed)
Called pt no answer, if she calls back advise note is ready for pick up

## 2019-06-04 ENCOUNTER — Encounter: Payer: Self-pay | Admitting: Obstetrics & Gynecology

## 2019-06-07 ENCOUNTER — Ambulatory Visit (INDEPENDENT_AMBULATORY_CARE_PROVIDER_SITE_OTHER): Payer: Medicaid Other | Admitting: Maternal Newborn

## 2019-06-07 ENCOUNTER — Other Ambulatory Visit: Payer: Self-pay

## 2019-06-07 ENCOUNTER — Encounter: Payer: Self-pay | Admitting: Maternal Newborn

## 2019-06-07 DIAGNOSIS — Z3689 Encounter for other specified antenatal screening: Secondary | ICD-10-CM

## 2019-06-07 DIAGNOSIS — Z3A15 15 weeks gestation of pregnancy: Secondary | ICD-10-CM | POA: Diagnosis not present

## 2019-06-07 DIAGNOSIS — Z3482 Encounter for supervision of other normal pregnancy, second trimester: Secondary | ICD-10-CM

## 2019-06-07 DIAGNOSIS — O0990 Supervision of high risk pregnancy, unspecified, unspecified trimester: Secondary | ICD-10-CM

## 2019-06-07 NOTE — Progress Notes (Signed)
ROB

## 2019-06-07 NOTE — Patient Instructions (Signed)

## 2019-06-07 NOTE — Progress Notes (Signed)
Virtual Visit via Telephone Note  I connected with Carla Ingram on 06/07/19 at  8:41 AM EDT by telephone and verified that I am speaking with the correct person using two identifiers.  Location: Patient: Home Provider: Office   I discussed the limitations of evaluation and management by telemedicine and the availability of in person appointments. The patient expressed understanding and agreed to proceed.  See below for documentation of the telephone visit:   Subjective  Carla Ingram is a 24 y.o. [phone] at [redacted]w[redacted]d being seen today for ongoing prenatal care.  She is currently monitored for the following issues for this high-risk pregnancy and has Temporomandibular joint disorders; B12 deficiency; Tobacco use disorder; Other psoriasis; History of 3 cesarean sections; and Supervision of high risk pregnancy, antepartum on their problem list.  ----------------------------------------------------------------------------------- Patient reports nausea. She feels better when she drinks [employer]. She is able to keep down food and liquids. She has some occasional cramps. Vag. Bleeding: None.   ----------------------------------------------------------------------------------- The following portions of the patient's history were reviewed and updated as appropriate: allergies, current medications, past family history, past medical history, past social history, past surgical history and problem list. Problem list updated.   Objective  Last menstrual period 02/20/2019, unknown if currently breastfeeding. Pregravid weight 159 lb (72.1 kg) Total Weight Gain 16 lb (7.258 kg)  Physical exam not possible due to virtual visit as part of COVID-19 precautions.  Assessment   23 y.o. [id number] at [redacted]w[redacted]d, EDD 11/27/2019 by Last Menstrual Period presenting for a prenatal telephone visit.  Plan   pregnancy 4  Problems (from 02/20/19 to present)    Problem Noted Resolved   Supervision of high risk pregnancy,  antepartum 03/28/2019 by [name], CNM No   Overview Addendum 05/10/2019  1:42 PM by [name], MD    [facility]inic [facility] Prenatal Labs  Dating [place] Blood type:     Genetic Screen NIPS: Antibody:   Anatomic [place]  Rubella:   Varicella:    GTT Early:               Third trimester:  RPR:     Rhogam  HBsAg:     TDaP vaccine        Flu Shot: HIV:     Baby Food                                GBS:   Contraception Possible BTL. Papers [ ]  Pap: 09/03/2016, NILM  CBB     CS/VBAC CS [facility])   Support Person                Anatomy scan at next visit.  I discussed the assessment and treatment plan with the patient. The patient was provided an opportunity to ask questions and all were answered. The patient agreed with the plan and demonstrated an understanding of the instructions.   The patient was advised to call back or seek an in-person evaluation as needed.  I provided 5 minutes of non-face-to-face time during this encounter.  Please refer to After Visit Summary for other counseling recommendations.   Return in about 4 weeks (around 07/05/2019) for ROB and anatomy scan.  [name], CNM 06/07/2019  8:54 AM

## 2019-06-09 ENCOUNTER — Telehealth: Payer: Self-pay

## 2019-06-09 NOTE — Telephone Encounter (Signed)
Pt calling; needs copy of Dr's note; quit [employer]; starting a new job.  [phone]  Pt will come p/u.

## 2019-07-05 ENCOUNTER — Other Ambulatory Visit: Payer: Self-pay

## 2019-07-05 ENCOUNTER — Ambulatory Visit (INDEPENDENT_AMBULATORY_CARE_PROVIDER_SITE_OTHER): Payer: Medicaid Other

## 2019-07-05 ENCOUNTER — Ambulatory Visit (INDEPENDENT_AMBULATORY_CARE_PROVIDER_SITE_OTHER): Payer: Medicaid Other | Admitting: Certified Nurse Midwife

## 2019-07-05 VITALS — BP 104/58 | Wt 176.0 lb

## 2019-07-05 DIAGNOSIS — O0992 Supervision of high risk pregnancy, unspecified, second trimester: Secondary | ICD-10-CM

## 2019-07-05 DIAGNOSIS — O0990 Supervision of high risk pregnancy, unspecified, unspecified trimester: Secondary | ICD-10-CM

## 2019-07-05 DIAGNOSIS — Z3A19 19 weeks gestation of pregnancy: Secondary | ICD-10-CM

## 2019-07-05 DIAGNOSIS — Z363 Encounter for antenatal screening for malformations: Secondary | ICD-10-CM | POA: Diagnosis not present

## 2019-07-05 DIAGNOSIS — Z3689 Encounter for other specified antenatal screening: Secondary | ICD-10-CM

## 2019-07-05 LAB — POCT URINALYSIS DIPSTICK OB
Glucose, UA: NEGATIVE
POC,PROTEIN,UA: NEGATIVE

## 2019-07-05 NOTE — Progress Notes (Signed)
ROB/ anatomy scan at 19wk2d: Feeling fetal movement. Having some right sciatica symptoms. Discussed stretches and using Biofreeze. Safety precautions to prevent falls discussed. CGA 19wk2d with normal anatomy, anterior placenta ROB in 4 weeks

## 2019-07-05 NOTE — Progress Notes (Signed)
ROB/Anatomy scan C/o fatigue, sciatica  Denies lof, no vb, Good FM

## 2019-07-30 ENCOUNTER — Observation Stay
Admission: EM | Admit: 2019-07-30 | Discharge: 2019-07-30 | Disposition: A | Discharge status: Home / Self Care | Payer: Medicaid Other | Attending: Maternal Newborn | Admitting: Maternal Newborn

## 2019-07-30 ENCOUNTER — Telehealth: Payer: Self-pay

## 2019-07-30 ENCOUNTER — Other Ambulatory Visit: Payer: Self-pay

## 2019-07-30 DIAGNOSIS — F17210 Nicotine dependence, cigarettes, uncomplicated: Secondary | ICD-10-CM | POA: Diagnosis not present

## 2019-07-30 DIAGNOSIS — Z3A23 23 weeks gestation of pregnancy: Secondary | ICD-10-CM | POA: Diagnosis not present

## 2019-07-30 DIAGNOSIS — Z880 Allergy status to penicillin: Secondary | ICD-10-CM | POA: Diagnosis not present

## 2019-07-30 DIAGNOSIS — R1030 Lower abdominal pain, unspecified: Secondary | ICD-10-CM | POA: Diagnosis present

## 2019-07-30 DIAGNOSIS — O99332 Smoking (tobacco) complicating pregnancy, second trimester: Secondary | ICD-10-CM | POA: Insufficient documentation

## 2019-07-30 DIAGNOSIS — O26892 Other specified pregnancy related conditions, second trimester: Secondary | ICD-10-CM | POA: Diagnosis not present

## 2019-07-30 DIAGNOSIS — O0990 Supervision of high risk pregnancy, unspecified, unspecified trimester: Secondary | ICD-10-CM

## 2019-07-30 DIAGNOSIS — Z888 Allergy status to other drugs, medicaments and biological substances status: Secondary | ICD-10-CM | POA: Diagnosis not present

## 2019-07-30 LAB — URINALYSIS, COMPLETE (UACMP) WITH MICROSCOPIC
Bilirubin Urine: NEGATIVE
Glucose, UA: NEGATIVE mg/dL
Hgb urine dipstick: NEGATIVE
Ketones, ur: NEGATIVE mg/dL
Leukocytes,Ua: NEGATIVE
Nitrite: NEGATIVE
Protein, ur: NEGATIVE mg/dL
Specific Gravity, Urine: 1.006 (ref 1.005–1.030)
pH: 8 (ref 5.0–8.0)

## 2019-07-30 MED ORDER — ACETAMINOPHEN 325 MG PO TABS: 650.0000 mg | ORAL_TABLET | ORAL | Status: DC | PRN

## 2019-07-30 NOTE — OB Triage Note (Signed)
Pt is a 24 yo, G4P3, [redacted]w[redacted]d. Presents with c/o lower abd cramping and pressure that radiates to her back. States it began 2 hours ago at home. Says she feels the urge to push with ctxs. Denies vaginal bleeding, discharge, and decreased fetal movement. Last intercourse was 2 days ago. Monitors applied and assessing, no contractions noted. Will continue to assess. Vitals stable.

## 2019-07-30 NOTE — Telephone Encounter (Signed)
Pt called triage stating she is feeling so much pressure she feels like she should push, she is having back pain and what she feels is contractions. I have advised pt to get to L&D to be checked.

## 2019-08-01 LAB — URINE CULTURE

## 2019-08-02 ENCOUNTER — Ambulatory Visit (INDEPENDENT_AMBULATORY_CARE_PROVIDER_SITE_OTHER): Payer: Medicaid Other | Admitting: Obstetrics and Gynecology

## 2019-08-02 ENCOUNTER — Other Ambulatory Visit: Payer: Self-pay

## 2019-08-02 VITALS — BP 112/62 | Wt 183.0 lb

## 2019-08-02 DIAGNOSIS — O9989 Other specified diseases and conditions complicating pregnancy, childbirth and the puerperium: Secondary | ICD-10-CM

## 2019-08-02 DIAGNOSIS — M549 Dorsalgia, unspecified: Secondary | ICD-10-CM | POA: Insufficient documentation

## 2019-08-02 DIAGNOSIS — O0992 Supervision of high risk pregnancy, unspecified, second trimester: Secondary | ICD-10-CM

## 2019-08-02 DIAGNOSIS — O99891 Other specified diseases and conditions complicating pregnancy: Secondary | ICD-10-CM

## 2019-08-02 DIAGNOSIS — O0990 Supervision of high risk pregnancy, unspecified, unspecified trimester: Secondary | ICD-10-CM

## 2019-08-02 DIAGNOSIS — Z98891 History of uterine scar from previous surgery: Secondary | ICD-10-CM

## 2019-08-02 DIAGNOSIS — Z3A23 23 weeks gestation of pregnancy: Secondary | ICD-10-CM

## 2019-08-02 DIAGNOSIS — O34219 Maternal care for unspecified type scar from previous cesarean delivery: Secondary | ICD-10-CM

## 2019-08-02 LAB — POCT URINALYSIS DIPSTICK OB
Glucose, UA: NEGATIVE
POC,PROTEIN,UA: NEGATIVE

## 2019-08-02 MED ORDER — CYCLOBENZAPRINE HCL 10 MG PO TABS: 10.0000 mg | ORAL_TABLET | Freq: Three times a day (TID) | ORAL | 0 refills | Status: DC | PRN

## 2019-08-02 NOTE — Progress Notes (Signed)
ROB Pelvic pain/ round ligament

## 2019-08-02 NOTE — Final Progress Note (Signed)
Physician Final Progress Note  Patient ID: Carla DykesJamy K Fraley MRN: [id number] DOB/AGE: 25-07-1996 25 y.o.  Admit date: 07/30/2019 Admitting provider: Nadara Mustardobert P Harris, MD Discharge date: 8/72020   Admission Diagnoses: Abdominal pain in pregnancy  Discharge Diagnoses: Same  History of Present Illness: The patient is a 23 y.o. female [phone] at 7435w4d who presents for abdominal pain that began this afternoon around 1430 and radiates to her back. She also has a feeling of pressure in her lower abdomen. No dysuria. Positive fetal movement. No vaginal bleeding or loss of fluid.  Review of Systems: Review of systems negative unless otherwise noted in HPI.   Past Medical History:  Diagnosis Date  . Anemia   . GERD (gastroesophageal reflux disease)   . Migraine   . Placental abruption    2012 at 35 wks  . Positive pregnancy test   . Psoriasis   . TMJ (dislocation of temporomandibular joint)     Past Surgical History:  Procedure Laterality Date  . CESAREAN SECTION  2012, 2016  . CESAREAN SECTION N/A 07/09/2016   Procedure: CESAREAN SECTION;  Surgeon: Nadara Mustardobert P Harris, MD;  Location: [facility] ORS;  Service: Obstetrics;  Laterality: N/A;  . DILATION AND CURETTAGE OF UTERUS  2009    No current facility-administered medications on file prior to encounter.    Current Outpatient Medications on File Prior to Encounter  Medication Sig Dispense Refill  . Prenatal Vit-Fe Fumarate-FA (MULTIVITAMIN-PRENATAL) 27-0.8 MG TABS tablet Take 1 tablet by mouth daily at 12 noon.      Allergies  Allergen Reactions  . Macrobid [Nitrofurantoin Macrocrystal] Other (See Comments)    Can not move her body, loses all control of arms legs   . Augmentin [Amoxicillin-Pot Clavulanate] Hives and Swelling  . Cephalexin Other (See Comments)    Achy in bones Achy in bones  . Penicillins Hives and Swelling    Social History   Socioeconomic History  . Marital status: Single    Spouse name: Not on file  . Number of  children: 3  . Years of education: Not on file  . Highest education level: 9th grade  Occupational History  . Occupation: unemployed   Social Needs  . Financial resource strain: Not on file  . Food insecurity    Worry: Not on file    Inability: Not on file  . Transportation needs    Medical: Not on file    Non-medical: Not on file  Tobacco Use  . Smoking status: Current Every Day Smoker    Packs/day: 0.50    Years: 11.00    Pack years: 5.50    Types: Cigarettes    Start date: 10/31/2004  . Smokeless tobacco: Never Used  Substance and Sexual Activity  . Alcohol use: Not Currently    Alcohol/week: 8.0 standard drinks    Types: 8 Shots of liquor per week    Comment: she binge drinks when has a weekend without kids - rum  . Drug use: No  . Sexual activity: Yes    Partners: Male    Birth control/protection: None  Lifestyle  . Physical activity    Days per week: Not on file    Minutes per session: Not on file  . Stress: Not on file  Relationships  . Social Musicianconnections    Talks on phone: Not on file    Gets together: Not on file    Attends religious service: Not on file    Active member of club or organization: Not  on file    Attends meetings of clubs or organizations: Not on file    Relationship status: Not on file  . Intimate partner violence    Fear of current or ex partner: Not on file    Emotionally abused: Not on file    Physically abused: Not on file    Forced sexual activity: Not on file  Other Topics Concern  . Not on file  Social History Narrative   Lives with her cousin ( raised her) , three daughter's - first one from a different father.     Family history:  Family History  Problem Relation Age of Onset  . CVA Mother   . Stroke Mother        during pregnancy    Physical Exam: BP (!) 110/56 (BP Location: Right Arm)   Pulse 86   Temp 98.3 F (36.8 C) (Oral)   Resp 20   Ht 5\' 3"  (1.6 m)   Wt 74.8 kg   LMP 02/20/2019   BMI 29.23 kg/m   Patient  left before provider could see  FHT 148 bpm Toco quiet, patient stated that she could feel intermittent pain but not palpable or visible on EFM  Significant Findings/ Diagnostic Studies: UA negative for blood, leukocytes, nitrites. Urine culture pending.  Discharge Condition: good  Disposition: Home  Diet: Regular diet  Discharge Activity: Activity as tolerated  Discharge Instructions    Discharge activity:  No Restrictions   Complete by: As directed    Discharge diet:  No restrictions   Complete by: As directed    No sexual activity restrictions   Complete by: As directed    Notify physician for a general feeling that "something is not right"   Complete by: As directed    Notify physician for increase or change in vaginal discharge   Complete by: As directed    Notify physician for intestinal cramps, with or without diarrhea, sometimes described as "gas pain"   Complete by: As directed    Notify physician for leaking of fluid   Complete by: As directed    Notify physician for low, dull backache, unrelieved by heat or Tylenol   Complete by: As directed    Notify physician for menstrual like cramps   Complete by: As directed    Notify physician for pelvic pressure   Complete by: As directed    Notify physician for uterine contractions.  These may be painless and feel like the uterus is tightening or the baby is  "balling up"   Complete by: As directed    Notify physician for vaginal bleeding   Complete by: As directed    PRETERM LABOR:  Includes any of the follwing symptoms that occur between 20 - [redacted] weeks gestation.  If these symptoms are not stopped, preterm labor can result in preterm delivery, placing your baby at risk   Complete by: As directed      Allergies as of 07/30/2019      Reactions   Macrobid [nitrofurantoin Macrocrystal] Other (See Comments)   Can not move her body, loses all control of arms legs    Augmentin [amoxicillin-pot Clavulanate] Hives, Swelling    Cephalexin Other (See Comments)   Achy in bones Achy in bones   Penicillins Hives, Swelling      Medication List    TAKE these medications   multivitamin-prenatal 27-0.8 MG Tabs tablet Take 1 tablet by mouth daily at 12 noon.      After monitoring,  patient stated that she was feeling better and needed to leave to pick up her children. RN notified patient of negative UA findings, and that I will call if urine culture results require treatment.  Signed: [name], CNM  07/30/2019

## 2019-08-02 NOTE — Progress Notes (Signed)
Routine Prenatal Care Visit  Subjective  Carla Ingram is a 23 y.o. [id number] at 6147w4d being seen today for ongoing prenatal care.  She is currently monitored for the following issues for this high-risk pregnancy and has Temporomandibular joint disorders; B12 deficiency; Tobacco use disorder; Other psoriasis; History of 3 cesarean sections; Supervision of high risk pregnancy, antepartum; and Back pain affecting pregnancy, antepartum on their problem list.  ----------------------------------------------------------------------------------- Patient reports round ligament pain.  Also lumbago Contractions: Not present. Vag. Bleeding: None.  Movement: Present. Denies leaking of fluid.  ----------------------------------------------------------------------------------- The following portions of the patient's history were reviewed and updated as appropriate: allergies, current medications, past family history, past medical history, past social history, past surgical history and problem list. Problem list updated.   Objective  Blood pressure 112/62, weight 183 lb (83 kg), last menstrual period 02/20/2019, unknown if currently breastfeeding. Pregravid weight 159 lb (72.1 kg) Total Weight Gain 24 lb (10.9 kg)  Body mass index is 32.42 kg/m.  Urinalysis:      Fetal Status: Fetal Heart Rate (bpm): 140 Fundal Height: 24 cm Movement: Present     General:  Alert, oriented and cooperative. Patient is in no acute distress.  Skin: Skin is warm and dry. No rash noted.   Cardiovascular: Normal heart rate noted  Respiratory: Normal respiratory effort, no problems with respiration noted  Abdomen: Soft, gravid, appropriate for gestational age. Pain/Pressure: Present     Pelvic:  Cervical exam deferred        Extremities: Normal range of motion.     ental Status: Normal mood and affect. Normal behavior. Normal judgment and thought content.     Assessment   23 y.o. [id number] at 4647w4d by  11/25/2019, by  Ultrasound presenting for routine prenatal visit  Plan   pregnancy 4  Problems (from 02/20/19 to present)    Problem Noted Resolved   Supervision of high risk pregnancy, antepartum 03/28/2019 by Oswaldo ConroySchmid, Jacelyn Y, CNM No   Overview Addendum 07/05/2019 11:10 AM by Farrel ConnersGutierrez, Colleen, CNM    Clinic [facility] Prenatal Labs  Dating US Blood type: O/Positive/-- (05/18 1348)   Genetic Screen NIPS: diploid XY Antibody:Negative (05/18 1348)  Anatomic US Normal anatomy, anterior placenta Rubella: 1.96 (05/18 1348) Varicella:  Immune  GTT Early:     67          Third trimester:  RPR: Non Reactive (05/18 1348)   Rhogam  HBsAg: Negative (05/18 1348)   TDaP vaccine        Flu Shot: HIV: Non Reactive (05/18 1348)   Baby Food                                GBS:   Contraception Possible BTL. Papers [ ]  Pap: 09/03/2016, NILM  CBB     CS/VBAC CS (PH)   Support Person                  Gestational age appropriate obstetric precautions including but not limited to vaginal bleeding, contractions, leaking of fluid and fetal movement were reviewed in detail with the patient.    1) Lumbago - Rx flexeril was on baclofen for back pain last pregnancy.  No sciatica symptoms. - PT referral - Discussed supportive measures localized heat, pool, topical lineament cream and tylenol  Return in about 4 weeks (around 08/30/2019) for ROB and 28 week labs.  Vena AustriaAndreas Tunya Held, MD, [facility], [facility] 08/02/2019, 9:43 AM

## 2019-08-03 NOTE — Discharge Summary (Signed)
See Final Progress Note 07/30/2019.

## 2019-08-31 ENCOUNTER — Encounter: Payer: Self-pay | Admitting: Advanced Practice Midwife

## 2019-08-31 ENCOUNTER — Other Ambulatory Visit: Payer: Self-pay

## 2019-08-31 ENCOUNTER — Other Ambulatory Visit: Payer: Medicaid Other

## 2019-08-31 ENCOUNTER — Ambulatory Visit (INDEPENDENT_AMBULATORY_CARE_PROVIDER_SITE_OTHER): Payer: Medicaid Other | Admitting: Advanced Practice Midwife

## 2019-08-31 VITALS — BP 120/60 | Wt 186.0 lb

## 2019-08-31 DIAGNOSIS — O0990 Supervision of high risk pregnancy, unspecified, unspecified trimester: Secondary | ICD-10-CM

## 2019-08-31 DIAGNOSIS — O34219 Maternal care for unspecified type scar from previous cesarean delivery: Secondary | ICD-10-CM

## 2019-08-31 DIAGNOSIS — Z3A27 27 weeks gestation of pregnancy: Secondary | ICD-10-CM

## 2019-08-31 LAB — POCT URINALYSIS DIPSTICK OB
Glucose, UA: NEGATIVE
POC,PROTEIN,UA: NEGATIVE

## 2019-08-31 NOTE — Patient Instructions (Signed)
Third Trimester of Pregnancy The third trimester is from week 28 through week 40 (months 7 through 9). The third trimester is a time when the unborn baby (fetus) is growing rapidly. At the end of the ninth month, the fetus is about 20 inches in length and weighs 6-10 pounds. Body changes during your third trimester Your body will continue to go through many changes during pregnancy. The changes vary from woman to woman. During the third trimester:  Your weight will continue to increase. You can expect to gain 25-35 pounds (11-16 kg) by the end of the pregnancy.  You may begin to get stretch marks on your hips, abdomen, and breasts.  You may urinate more often because the fetus is moving lower into your pelvis and pressing on your bladder.  You may develop or continue to have heartburn. This is caused by increased hormones that slow down muscles in the digestive tract.  You may develop or continue to have constipation because increased hormones slow digestion and cause the muscles that push waste through your intestines to relax.  You may develop hemorrhoids. These are swollen veins (varicose veins) in the rectum that can itch or be painful.  You may develop swollen, bulging veins (varicose veins) in your legs.  You may have increased body aches in the pelvis, back, or thighs. This is due to weight gain and increased hormones that are relaxing your joints.  You may have changes in your hair. These can include thickening of your hair, rapid growth, and changes in texture. Some women also have hair loss during or after pregnancy, or hair that feels dry or thin. Your hair will most likely return to normal after your baby is born.  Your breasts will continue to grow and they will continue to become tender. A yellow fluid (colostrum) may leak from your breasts. This is the first milk you are producing for your baby.  Your belly button may stick out.  You may notice more swelling in your hands,  face, or ankles.  You may have increased tingling or numbness in your hands, arms, and legs. The skin on your belly may also feel numb.  You may feel short of breath because of your expanding uterus.  You may have more problems sleeping. This can be caused by the size of your belly, increased need to urinate, and an increase in your body's metabolism.  You may notice the fetus "dropping," or moving lower in your abdomen (lightening).  You may have increased vaginal discharge.  You may notice your joints feel loose and you may have pain around your pelvic bone. What to expect at prenatal visits You will have prenatal exams every 2 weeks until week 36. Then you will have weekly prenatal exams. During a routine prenatal visit:  You will be weighed to make sure you and the baby are growing normally.  Your blood pressure will be taken.  Your abdomen will be measured to track your baby's growth.  The fetal heartbeat will be listened to.  Any test results from the previous visit will be discussed.  You may have a cervical check near your due date to see if your cervix has softened or thinned (effaced).  You will be tested for Group B streptococcus. This happens between 35 and 37 weeks. Your health care provider may ask you:  What your birth plan is.  How you are feeling.  If you are feeling the baby move.  If you have had any abnormal   symptoms, such as leaking fluid, bleeding, severe headaches, or abdominal cramping.  If you are using any tobacco products, including cigarettes, chewing tobacco, and electronic cigarettes.  If you have any questions. Other tests or screenings that may be performed during your third trimester include:  Blood tests that check for low iron levels (anemia).  Fetal testing to check the health, activity level, and growth of the fetus. Testing is done if you have certain medical conditions or if there are problems during the pregnancy.  Nonstress test  (NST). This test checks the health of your baby to make sure there are no signs of problems, such as the baby not getting enough oxygen. During this test, a belt is placed around your belly. The baby is made to move, and its heart rate is monitored during movement. What is false labor? False labor is a condition in which you feel small, irregular tightenings of the muscles in the womb (contractions) that usually go away with rest, changing position, or drinking water. These are called Braxton Hicks contractions. Contractions may last for hours, days, or even weeks before true labor sets in. If contractions come at regular intervals, become more frequent, increase in intensity, or become painful, you should see your health care provider. What are the signs of labor?  Abdominal cramps.  Regular contractions that start at 10 minutes apart and become stronger and more frequent with time.  Contractions that start on the top of the uterus and spread down to the lower abdomen and back.  Increased pelvic pressure and dull back pain.  A watery or bloody mucus discharge that comes from the vagina.  Leaking of amniotic fluid. This is also known as your "water breaking." It could be a slow trickle or a gush. Let your health care provider know if it has a color or strange odor. If you have any of these signs, call your health care provider right away, even if it is before your due date. Follow these instructions at home: Medicines  Follow your health care provider's instructions regarding medicine use. Specific medicines may be either safe or unsafe to take during pregnancy.  Take a prenatal vitamin that contains at least 600 micrograms (mcg) of folic acid.  If you develop constipation, try taking a stool softener if your health care provider approves. Eating and drinking   Eat a balanced diet that includes fresh fruits and vegetables, whole grains, good sources of protein such as meat, eggs, or tofu,  and low-fat dairy. Your health care provider will help you determine the amount of weight gain that is right for you.  Avoid raw meat and uncooked cheese. These carry germs that can cause birth defects in the baby.  If you have low calcium intake from food, talk to your health care provider about whether you should take a daily calcium supplement.  Eat four or five small meals rather than three large meals a day.  Limit foods that are high in fat and processed sugars, such as fried and sweet foods.  To prevent constipation: ? Drink enough fluid to keep your urine clear or pale yellow. ? Eat foods that are high in fiber, such as fresh fruits and vegetables, whole grains, and beans. Activity  Exercise only as directed by your health care provider. Most women can continue their usual exercise routine during pregnancy. Try to exercise for 30 minutes at least 5 days a week. Stop exercising if you experience uterine contractions.  Avoid heavy lifting.  Do   not exercise in extreme heat or humidity, or at high altitudes.  Wear low-heel, comfortable shoes.  Practice good posture.  You may continue to have sex unless your health care provider tells you otherwise. Relieving pain and discomfort  Take frequent breaks and rest with your legs elevated if you have leg cramps or low back pain.  Take warm sitz baths to soothe any pain or discomfort caused by hemorrhoids. Use hemorrhoid cream if your health care provider approves.  Wear a good support bra to prevent discomfort from breast tenderness.  If you develop varicose veins: ? Wear support pantyhose or compression stockings as told by your healthcare provider. ? Elevate your feet for 15 minutes, 3-4 times a day. Prenatal care  Write down your questions. Take them to your prenatal visits.  Keep all your prenatal visits as told by your health care provider. This is important. Safety  Wear your seat belt at all times when driving.  Make  a list of emergency phone numbers, including numbers for family, friends, the hospital, and police and fire departments. General instructions  Avoid cat litter boxes and soil used by cats. These carry germs that can cause birth defects in the baby. If you have a cat, ask someone to clean the litter box for you.  Do not travel far distances unless it is absolutely necessary and only with the approval of your health care provider.  Do not use hot tubs, steam rooms, or saunas.  Do not drink alcohol.  Do not use any products that contain nicotine or tobacco, such as cigarettes and e-cigarettes. If you need help quitting, ask your health care provider.  Do not use any medicinal herbs or unprescribed drugs. These chemicals affect the formation and growth of the baby.  Do not douche or use tampons or scented sanitary pads.  Do not cross your legs for long periods of time.  To prepare for the arrival of your baby: ? Take prenatal classes to understand, practice, and ask questions about labor and delivery. ? Make a trial run to the hospital. ? Visit the hospital and tour the maternity area. ? Arrange for maternity or paternity leave through employers. ? Arrange for family and friends to take care of pets while you are in the hospital. ? Purchase a rear-facing car seat and make sure you know how to install it in your car. ? Pack your hospital bag. ? Prepare the baby's nursery. Make sure to remove all pillows and stuffed animals from the baby's crib to prevent suffocation.  Visit your dentist if you have not gone during your pregnancy. Use a soft toothbrush to brush your teeth and be gentle when you floss. Contact a health care provider if:  You are unsure if you are in labor or if your water has broken.  You become dizzy.  You have mild pelvic cramps, pelvic pressure, or nagging pain in your abdominal area.  You have lower back pain.  You have persistent nausea, vomiting, or diarrhea.   You have an unusual or bad smelling vaginal discharge.  You have pain when you urinate. Get help right away if:  Your water breaks before 37 weeks.  You have regular contractions less than 5 minutes apart before 37 weeks.  You have a fever.  You are leaking fluid from your vagina.  You have spotting or bleeding from your vagina.  You have severe abdominal pain or cramping.  You have rapid weight loss or weight gain.  You have   shortness of breath with chest pain.  You notice sudden or extreme swelling of your face, hands, ankles, feet, or legs.  Your baby makes fewer than 10 movements in 2 hours.  You have severe headaches that do not go away when you take medicine.  You have vision changes. Summary  The third trimester is from week 28 through week 40, months 7 through 9. The third trimester is a time when the unborn baby (fetus) is growing rapidly.  During the third trimester, your discomfort may increase as you and your baby continue to gain weight. You may have abdominal, leg, and back pain, sleeping problems, and an increased need to urinate.  During the third trimester your breasts will keep growing and they will continue to become tender. A yellow fluid (colostrum) may leak from your breasts. This is the first milk you are producing for your baby.  False labor is a condition in which you feel small, irregular tightenings of the muscles in the womb (contractions) that eventually go away. These are called Braxton Hicks contractions. Contractions may last for hours, days, or even weeks before true labor sets in.  Signs of labor can include: abdominal cramps; regular contractions that start at 10 minutes apart and become stronger and more frequent with time; watery or bloody mucus discharge that comes from the vagina; increased pelvic pressure and dull back pain; and leaking of amniotic fluid. This information is not intended to replace advice given to you by your health  care provider. Make sure you discuss any questions you have with your health care provider. Document Released: 12/03/2001 Document Revised: 04/01/2019 Document Reviewed: 01/14/2017 Elsevier Patient Education  2020 [employer]  

## 2019-08-31 NOTE — Progress Notes (Signed)
  Routine Prenatal Care Visit  Subjective  Carla Ingram is a 23 y.o. [phone] at [redacted]w[redacted]d being seen today for ongoing prenatal care.  She is currently monitored for the following issues for this high-risk pregnancy and has Temporomandibular joint disorders; B12 deficiency; Tobacco use disorder; Other psoriasis; History of 3 cesarean sections; Supervision of high risk pregnancy, antepartum; and Back pain affecting pregnancy, antepartum on their problem list.  ----------------------------------------------------------------------------------- Patient reports no complaints.   Contractions: Not present. Vag. Bleeding: None.  Movement: Present. Leaking Fluid denies.  ----------------------------------------------------------------------------------- The following portions of the patient's history were reviewed and updated as appropriate: allergies, current medications, past family history, past medical history, past social history, past surgical history and problem list. Problem list updated.  Objective  Blood pressure 120/60, weight 186 lb (84.4 kg), last menstrual period 02/20/2019, unknown if currently breastfeeding. Pregravid weight 159 lb (72.1 kg) Total Weight Gain 27 lb (12.2 kg) Urinalysis: Urine Protein    Urine Glucose    Fetal Status: Fetal Heart Rate (bpm): 150 Fundal Height: 28 cm Movement: Present     General:  Alert, oriented and cooperative. Patient is in no acute distress.  Skin: Skin is warm and dry. No rash noted.   Cardiovascular: Normal heart rate noted  Respiratory: Normal respiratory effort, no problems with respiration noted  Abdomen: Soft, gravid, appropriate for gestational age. Pain/Pressure: Present     Pelvic:  Cervical exam deferred        Extremities: Normal range of motion.  Edema: None  Mental Status: Normal mood and affect. Normal behavior. Normal judgment and thought content.   Assessment   23 y.o. [id number] at [redacted]w[redacted]d by  11/25/2019, by Ultrasound presenting for  routine prenatal visit  Plan   pregnancy 4  Problems (from 02/20/19 to present)    Problem Noted Resolved   Supervision of high risk pregnancy, antepartum 03/28/2019 by [name], CNM No   Overview Addendum 08/02/2019  9:41 AM by Malachy Mood, MD    [facility]inic [facility] Prenatal Labs  Dating 11 week [place] Blood type: O/Positive/-- (05/18 1348)   Genetic Screen NIPS: diploid XY Antibody:Negative (05/18 1348)  Anatomic [place] Normal anatomy, anterior placenta Rubella: 1.96 (05/18 1348) Varicella:  Immune  GTT Early:     67          Third trimester:  RPR: Non Reactive (05/18 1348)   Rhogam  HBsAg: Negative (05/18 1348)   TDaP vaccine        Flu Shot: HIV: Non Reactive (05/18 1348)   Baby Food                                GBS:   Contraception Possible BTL. Papers [ ]  Pap: 09/03/2016, NILM  CBB     CS/VBAC CS (PH)   Support Person                  Preterm labor symptoms and general obstetric precautions including but not limited to vaginal bleeding, contractions, leaking of fluid and fetal movement were reviewed in detail with the patient. Please refer to After Visit Summary for other counseling recommendations.   Return in about 2 weeks (around 09/14/2019) for rob.  Rod Can, CNM 08/31/2019 10:17 AM

## 2019-08-31 NOTE — Addendum Note (Signed)
Addended by: Quintella Baton D on: 08/31/2019 10:22 AM   Modules accepted: Orders

## 2019-09-01 LAB — 28 WEEK RH+PANEL
Basophils Absolute: 0 10*3/uL (ref 0.0–0.2)
Basos: 0 %
EOS (ABSOLUTE): 0.1 10*3/uL (ref 0.0–0.4)
Eos: 1 %
Gestational Diabetes Screen: 124 mg/dL (ref 65–139)
HIV Screen 4th Generation wRfx: NONREACTIVE
Hematocrit: 32 % — ABNORMAL LOW (ref 34.0–46.6)
Hemoglobin: 11.4 g/dL (ref 11.1–15.9)
Immature Grans (Abs): 0.1 10*3/uL (ref 0.0–0.1)
Immature Granulocytes: 1 %
Lymphocytes Absolute: 2.4 10*3/uL (ref 0.7–3.1)
Lymphs: 14 %
MCH: 33.9 pg — ABNORMAL HIGH (ref 26.6–33.0)
MCHC: 35.6 g/dL (ref 31.5–35.7)
MCV: 95 fL (ref 79–97)
Monocytes Absolute: 0.7 10*3/uL (ref 0.1–0.9)
Monocytes: 4 %
Neutrophils Absolute: 13.2 10*3/uL — ABNORMAL HIGH (ref 1.4–7.0)
Neutrophils: 80 %
Platelets: 365 10*3/uL (ref 150–450)
RBC: 3.36 x10E6/uL — ABNORMAL LOW (ref 3.77–5.28)
RDW: 12 % (ref 11.7–15.4)
RPR Ser Ql: NONREACTIVE
WBC: 16.5 10*3/uL — ABNORMAL HIGH (ref 3.4–10.8)

## 2019-09-14 ENCOUNTER — Ambulatory Visit (INDEPENDENT_AMBULATORY_CARE_PROVIDER_SITE_OTHER): Payer: Medicaid Other | Admitting: Obstetrics and Gynecology

## 2019-09-14 ENCOUNTER — Other Ambulatory Visit: Payer: Self-pay

## 2019-09-14 ENCOUNTER — Encounter: Payer: Self-pay | Admitting: Obstetrics and Gynecology

## 2019-09-14 VITALS — BP 112/58 | Wt 188.0 lb

## 2019-09-14 DIAGNOSIS — O0990 Supervision of high risk pregnancy, unspecified, unspecified trimester: Secondary | ICD-10-CM

## 2019-09-14 DIAGNOSIS — Z23 Encounter for immunization: Secondary | ICD-10-CM | POA: Diagnosis not present

## 2019-09-14 DIAGNOSIS — Z3A29 29 weeks gestation of pregnancy: Secondary | ICD-10-CM

## 2019-09-14 DIAGNOSIS — N898 Other specified noninflammatory disorders of vagina: Secondary | ICD-10-CM

## 2019-09-14 DIAGNOSIS — Z98891 History of uterine scar from previous surgery: Secondary | ICD-10-CM

## 2019-09-14 DIAGNOSIS — R102 Pelvic and perineal pain: Secondary | ICD-10-CM

## 2019-09-14 DIAGNOSIS — O26899 Other specified pregnancy related conditions, unspecified trimester: Secondary | ICD-10-CM

## 2019-09-14 DIAGNOSIS — O34219 Maternal care for unspecified type scar from previous cesarean delivery: Secondary | ICD-10-CM

## 2019-09-14 DIAGNOSIS — O26893 Other specified pregnancy related conditions, third trimester: Secondary | ICD-10-CM

## 2019-09-14 DIAGNOSIS — O0993 Supervision of high risk pregnancy, unspecified, third trimester: Secondary | ICD-10-CM

## 2019-09-14 NOTE — Progress Notes (Signed)
Routine Prenatal Care Visit  Subjective  Carla Ingram is a 23 y.o. [phone] at [redacted]w[redacted]d being seen today for ongoing prenatal care.  She is currently monitored for the following issues for this high-risk pregnancy and has Temporomandibular joint disorders; B12 deficiency; Tobacco use disorder; Other psoriasis; History of 3 cesarean sections; Supervision of high risk pregnancy, antepartum; and Back pain affecting pregnancy, antepartum on their problem list.  ----------------------------------------------------------------------------------- Patient reports pelvic pressure. Feels like she has to push. Denies constipation. .   Contractions: Irregular. Vag. Bleeding: None.  Movement: Present. Denies leaking of fluid.  ----------------------------------------------------------------------------------- The following portions of the patient's history were reviewed and updated as appropriate: allergies, current medications, past family history, past medical history, past social history, past surgical history and problem list. Problem list updated.   Objective  Blood pressure (!) 112/58, weight 188 lb (85.3 kg), last menstrual period 02/20/2019, unknown if currently breastfeeding. Pregravid weight 159 lb (72.1 kg) Total Weight Gain 29 lb (13.2 kg) Urinalysis:      Fetal Status: Fetal Heart Rate (bpm): 150 Fundal Height: 30 cm Movement: Present     General:  Alert, oriented and cooperative. Patient is in no acute distress.  Skin: Skin is warm and dry. No rash noted.   Cardiovascular: Normal heart rate noted  Respiratory: Normal respiratory effort, no problems with respiration noted  Abdomen: Soft, gravid, appropriate for gestational age. Pain/Pressure: Present     Pelvic:  Cervical exam performed Dilation: Closed Effacement (%): 0 Station: -3  Extremities: Normal range of motion.  Edema: None  Mental Status: Normal mood and affect. Normal behavior. Normal judgment and thought content.      Assessment   23 y.o. [id number] at [redacted]w[redacted]d by  11/25/2019, by Ultrasound presenting for routine prenatal visit  Plan   pregnancy 4  Problems (from 02/20/19 to present)    Problem Noted Resolved   Supervision of high risk pregnancy, antepartum 03/28/2019 by [name], CNM No   Overview Addendum 09/01/2019  1:15 PM by Malachy Mood, MD    [facility]inic [facility] Prenatal Labs  Dating 11 week [place] Blood type: O/Positive/-- (05/18 1348)   Genetic Screen NIPS: diploid XY Antibody:Negative (05/18 1348)  Anatomic [place] Normal anatomy, anterior placenta Rubella: 1.96 (05/18 1348) Varicella:  Immune  GTT Early:     67          Third trimester: 124 RPR: Non Reactive (05/18 1348)   Rhogam  HBsAg: Negative (05/18 1348)   TDaP vaccine        Flu Shot: HIV: Non Reactive (05/18 1348)   Baby Food                                GBS:   Contraception Possible BTL. Papers [ ]  Pap: 09/03/2016, NILM  CBB     CS/VBAC CS (PH)   Support Person                  Gestational age appropriate obstetric precautions including but not limited to vaginal bleeding, contractions, leaking of fluid and fetal movement were reviewed in detail with the patient.    Will send FFN and Nuswab Declines tubal ligation Would like Dr. Kenton Kingfisher to do cesarean, scheduling issue with Holiday (Thanksgiving) Tdap today  Return in about 2 weeks (around 09/28/2019) for [place] with Dr. Kenton Kingfisher and [place].  Homero Fellers MD [facility], [facility] 09/14/2019, 12:10 PM

## 2019-09-14 NOTE — Progress Notes (Signed)
ROB C/o pain in back, pelvic and stomach area, feeling like she needs to push for about 2-3 days  Some clear fluid on Saturday, no vb Good FM

## 2019-09-14 NOTE — Progress Notes (Signed)
TDAP given today Right Deltoid. Patient tolerated well.

## 2019-09-15 DIAGNOSIS — I959 Hypotension, unspecified: Secondary | ICD-10-CM | POA: Diagnosis not present

## 2019-09-15 DIAGNOSIS — O4693 Antepartum hemorrhage, unspecified, third trimester: Secondary | ICD-10-CM | POA: Diagnosis not present

## 2019-09-15 DIAGNOSIS — N898 Other specified noninflammatory disorders of vagina: Secondary | ICD-10-CM | POA: Diagnosis not present

## 2019-09-15 DIAGNOSIS — O99113 Other diseases of the blood and blood-forming organs and certain disorders involving the immune mechanism complicating pregnancy, third trimester: Secondary | ICD-10-CM | POA: Diagnosis not present

## 2019-09-15 DIAGNOSIS — R1084 Generalized abdominal pain: Secondary | ICD-10-CM | POA: Diagnosis not present

## 2019-09-15 DIAGNOSIS — L409 Psoriasis, unspecified: Secondary | ICD-10-CM | POA: Diagnosis not present

## 2019-09-15 DIAGNOSIS — O99333 Smoking (tobacco) complicating pregnancy, third trimester: Secondary | ICD-10-CM | POA: Diagnosis not present

## 2019-09-15 DIAGNOSIS — D72829 Elevated white blood cell count, unspecified: Secondary | ICD-10-CM | POA: Diagnosis not present

## 2019-09-15 DIAGNOSIS — O26893 Other specified pregnancy related conditions, third trimester: Secondary | ICD-10-CM | POA: Diagnosis not present

## 2019-09-15 DIAGNOSIS — Z98891 History of uterine scar from previous surgery: Secondary | ICD-10-CM | POA: Diagnosis not present

## 2019-09-15 DIAGNOSIS — Z3A29 29 weeks gestation of pregnancy: Secondary | ICD-10-CM | POA: Diagnosis not present

## 2019-09-15 DIAGNOSIS — R1030 Lower abdominal pain, unspecified: Secondary | ICD-10-CM | POA: Diagnosis not present

## 2019-09-15 DIAGNOSIS — R58 Hemorrhage, not elsewhere classified: Secondary | ICD-10-CM | POA: Diagnosis not present

## 2019-09-16 DIAGNOSIS — O4693 Antepartum hemorrhage, unspecified, third trimester: Secondary | ICD-10-CM | POA: Diagnosis not present

## 2019-09-16 DIAGNOSIS — D72829 Elevated white blood cell count, unspecified: Secondary | ICD-10-CM | POA: Diagnosis not present

## 2019-09-16 DIAGNOSIS — O99333 Smoking (tobacco) complicating pregnancy, third trimester: Secondary | ICD-10-CM | POA: Diagnosis not present

## 2019-09-16 DIAGNOSIS — L409 Psoriasis, unspecified: Secondary | ICD-10-CM | POA: Diagnosis not present

## 2019-09-16 DIAGNOSIS — Z3A30 30 weeks gestation of pregnancy: Secondary | ICD-10-CM | POA: Diagnosis not present

## 2019-09-16 DIAGNOSIS — O99113 Other diseases of the blood and blood-forming organs and certain disorders involving the immune mechanism complicating pregnancy, third trimester: Secondary | ICD-10-CM | POA: Diagnosis not present

## 2019-09-16 DIAGNOSIS — O9989 Other specified diseases and conditions complicating pregnancy, childbirth and the puerperium: Secondary | ICD-10-CM | POA: Diagnosis not present

## 2019-09-16 DIAGNOSIS — Z363 Encounter for antenatal screening for malformations: Secondary | ICD-10-CM | POA: Diagnosis not present

## 2019-09-17 DIAGNOSIS — O99333 Smoking (tobacco) complicating pregnancy, third trimester: Secondary | ICD-10-CM | POA: Diagnosis not present

## 2019-09-17 DIAGNOSIS — O4693 Antepartum hemorrhage, unspecified, third trimester: Secondary | ICD-10-CM | POA: Diagnosis not present

## 2019-09-17 DIAGNOSIS — D72829 Elevated white blood cell count, unspecified: Secondary | ICD-10-CM | POA: Diagnosis not present

## 2019-09-17 DIAGNOSIS — Z3A30 30 weeks gestation of pregnancy: Secondary | ICD-10-CM | POA: Diagnosis not present

## 2019-09-17 DIAGNOSIS — O99113 Other diseases of the blood and blood-forming organs and certain disorders involving the immune mechanism complicating pregnancy, third trimester: Secondary | ICD-10-CM | POA: Diagnosis not present

## 2019-09-18 LAB — NUSWAB VAGINITIS PLUS (VG+)
Candida albicans, NAA: NEGATIVE
Candida glabrata, NAA: NEGATIVE
Chlamydia trachomatis, NAA: NEGATIVE
Neisseria gonorrhoeae, NAA: NEGATIVE
Trich vag by NAA: NEGATIVE

## 2019-09-20 ENCOUNTER — Encounter: Payer: Self-pay | Admitting: Obstetrics & Gynecology

## 2019-09-20 ENCOUNTER — Other Ambulatory Visit: Payer: Self-pay

## 2019-09-20 ENCOUNTER — Ambulatory Visit (INDEPENDENT_AMBULATORY_CARE_PROVIDER_SITE_OTHER): Payer: Medicaid Other | Admitting: Obstetrics & Gynecology

## 2019-09-20 VITALS — BP 120/80 | Wt 186.0 lb

## 2019-09-20 DIAGNOSIS — O0990 Supervision of high risk pregnancy, unspecified, unspecified trimester: Secondary | ICD-10-CM

## 2019-09-20 DIAGNOSIS — Z3A30 30 weeks gestation of pregnancy: Secondary | ICD-10-CM

## 2019-09-20 DIAGNOSIS — O34219 Maternal care for unspecified type scar from previous cesarean delivery: Secondary | ICD-10-CM

## 2019-09-20 DIAGNOSIS — Z98891 History of uterine scar from previous surgery: Secondary | ICD-10-CM

## 2019-09-20 DIAGNOSIS — O0993 Supervision of high risk pregnancy, unspecified, third trimester: Secondary | ICD-10-CM

## 2019-09-20 LAB — POCT URINALYSIS DIPSTICK OB
Glucose, UA: NEGATIVE
POC,PROTEIN,UA: NEGATIVE

## 2019-09-20 NOTE — Addendum Note (Signed)
Addended by: Quintella Baton D on: 09/20/2019 01:18 PM   Modules accepted: Orders

## 2019-09-20 NOTE — Progress Notes (Signed)
  Subjective  Fetal Movement? yes Contractions? no Leaking Fluid? no Vaginal Bleeding? Yes- last week she had a one day episode of bleeding; seen in [facility] ER/L&D and admitted for 3 days observation w no further bleeding and an Korea reassuring prior to discharge (question of abruption prior to that w initial Korea).  SHe received BMZ and magnesium at that admission.  Objective  BP 120/80   Wt 186 lb (84.4 kg)   LMP 02/20/2019   BMI 32.95 kg/m  General: NAD Pumonary: no increased work of breathing Abdomen: gravid, non-tender Extremities: no edema Psychiatric: mood appropriate, affect full  Assessment  23 y.o. [id number] at [redacted]w[redacted]d by  11/25/2019, by Ultrasound presenting for routine prenatal visit  Plan   Problem List Items Addressed This Visit      Other   History of 3 cesarean sections   Supervision of high risk pregnancy, antepartum    Other Visit Diagnoses    [redacted] weeks gestation of pregnancy    -  Primary      pregnancy 4  Problems (from 02/20/19 to present)    Problem Noted Resolved   Supervision of high risk pregnancy, antepartum 03/28/2019 by [name], CNM No   Overview Addendum 09/14/2019 12:10 PM by Homero Fellers, MD    [facility]inic [facility] Prenatal Labs  Dating 11 week [place] Blood type: O/Positive/-- (05/18 1348)   Genetic Screen NIPS: diploid XY Antibody:Negative (05/18 1348)  Anatomic [place] Normal anatomy, anterior placenta Rubella: 1.96 (05/18 1348) Varicella:  Immune  GTT Early:     67           Third trimester: 124 RPR: Non Reactive (05/18 1348)   Rhogam  not needed HBsAg: Negative (05/18 1348)   TDaP vaccine        Flu Shot:declined HIV: Non Reactive (05/18 1348)   Baby Food         Breast and bottle   GBS:   Contraception Depo Pap: 09/03/2016, NILM  CBB  No   CS/VBAC CS ([place])   Support Person fiancee               Monitor for bleeding risks for abruption discussed; pelvic rest and modified bed rest encouraged and counseled  Korea next week  CS sch for Dec  3 Depo planned for birth control  Barnett Applebaum, MD, [place] 09/20/2019  11:52 AM

## 2019-09-20 NOTE — Progress Notes (Signed)
WNL- released to mychart

## 2019-09-21 ENCOUNTER — Telehealth: Payer: Self-pay | Admitting: Obstetrics & Gynecology

## 2019-09-21 NOTE — Telephone Encounter (Signed)
Patient is aware of H&P on 11/15/19 @ 9:20am w/ Dr. Kenton Kingfisher, Pre-admit Testing to be scheduled (patient will watch for notification in MyChart), COVID testing on 11/23/19, and OR on 11/25/19. Patient is aware she will be asked to quarantine after COVID testing.

## 2019-09-21 NOTE — Telephone Encounter (Signed)
-----   Message from [name], MD sent at 09/20/2019 11:49 AM EDT ----- Regarding: Surgery Surgery Booking Request Patient Full Name:  Carla Ingram  MRN: [id number]  DOB: May 25, 1996  Surgeon: Hoyt Koch, MD  Requested Surgery Date and Time: 11/25/2019 Primary Diagnosis AND Code: Repeat CS Secondary Diagnosis and Code:  Surgical Procedure: Cesarean Section L&D Notification: Yes Admission Status: surgery admit Length of Surgery: 40 min Special Case Needs: OnQ H&P: yes (date) Phone Interview???: no Interpreter: Language:  Medical Clearance: no Special Scheduling Instructions: no Acuity: P4

## 2019-09-24 ENCOUNTER — Telehealth: Payer: Self-pay

## 2019-09-24 NOTE — Telephone Encounter (Signed)
F/u call to pt; she called after hour nurse last night at 10:04 c/o 31w, small amount of spotting on the toilet paper.  [phone]  Pt states bleeding is still a small amount c wiping; had IC within 24hrs before it started.  Adv that is probably why she is spotting as the cervix is more easy to make bleed in preg.  Pt aware to call if bleeding becomes more like a period.

## 2019-09-30 ENCOUNTER — Ambulatory Visit (INDEPENDENT_AMBULATORY_CARE_PROVIDER_SITE_OTHER): Payer: Medicaid Other | Admitting: Obstetrics & Gynecology

## 2019-09-30 ENCOUNTER — Other Ambulatory Visit: Payer: Self-pay

## 2019-09-30 ENCOUNTER — Ambulatory Visit (INDEPENDENT_AMBULATORY_CARE_PROVIDER_SITE_OTHER): Payer: Medicaid Other

## 2019-09-30 VITALS — BP 100/60 | Wt 191.0 lb

## 2019-09-30 DIAGNOSIS — Z98891 History of uterine scar from previous surgery: Secondary | ICD-10-CM

## 2019-09-30 DIAGNOSIS — O34219 Maternal care for unspecified type scar from previous cesarean delivery: Secondary | ICD-10-CM

## 2019-09-30 DIAGNOSIS — Z362 Encounter for other antenatal screening follow-up: Secondary | ICD-10-CM

## 2019-09-30 DIAGNOSIS — Z3A32 32 weeks gestation of pregnancy: Secondary | ICD-10-CM

## 2019-09-30 DIAGNOSIS — O0990 Supervision of high risk pregnancy, unspecified, unspecified trimester: Secondary | ICD-10-CM

## 2019-09-30 DIAGNOSIS — O0993 Supervision of high risk pregnancy, unspecified, third trimester: Secondary | ICD-10-CM

## 2019-09-30 NOTE — Progress Notes (Signed)
  Subjective  Fetal Movement? yes Contractions? no Leaking Fluid? no Vaginal Bleeding? no Some left flank pain. No dysuria, fever, or pain in pelvis or suprapubic. No RLQ pain. Objective  BP 100/60   Wt 191 lb (86.6 kg)   LMP 02/20/2019   BMI 33.83 kg/m  General: NAD Pumonary: no increased work of breathing Abdomen: gravid, non-tender Extremities: no edema Psychiatric: mood appropriate, affect full  Review of ULTRASOUND.    I have personally reviewed images and report of recent ultrasound done at [facility].    Plan of management to be discussed with patient. Cyst reviewed, no sx's on that side, no s/sx torsion  UA NEG today  Assessment  23 y.o. [id number] at [redacted]w[redacted]d by  11/25/2019, by Ultrasound presenting for routine prenatal visit  Plan   Problem List Items Addressed This Visit      Other   History of 3 cesarean sections   Supervision of high risk pregnancy, antepartum    Other Visit Diagnoses    [redacted] weeks gestation of pregnancy    -  Primary      pregnancy 4  Problems (from 02/20/19 to present)    Problem Noted Resolved   Supervision of high risk pregnancy, antepartum 03/28/2019 by [name], CNM No   Overview Addendum 09/14/2019 12:10 PM by Homero Fellers, MD    [facility] Prenatal Labs  Dating 11 week [place] Blood type: O/Positive/-- (05/18 1348)   Genetic Screen NIPS: diploid XY Antibody:Negative (05/18 1348)  Anatomic [place] Normal anatomy, anterior placenta Rubella: 1.96 (05/18 1348) Varicella:  Immune  GTT Early:     67           Third trimester: 124 RPR: Non Reactive (05/18 1348)   Rhogam  not needed HBsAg: Negative (05/18 1348)   TDaP vaccine        Flu Shot:declines HIV: Non Reactive (05/18 1348)   Baby Food Breast                               GBS:p   Contraception Depo Pap: 09/03/2016, NILM  CBB  No   CS/VBAC CS ([place])   Support Person Husband               Contraception counseling, no longer desires BTL.  Depo in past, desires.  PNV, [facility],  PTL precautions  CS sch for 12/3  Declines flu shot  Barnett Applebaum, MD, [facility] 09/30/2019  10:46 AM

## 2019-09-30 NOTE — Patient Instructions (Signed)
Third Trimester of Pregnancy The third trimester is from week 28 through week 40 (months 7 through 9). The third trimester is a time when the unborn baby (fetus) is growing rapidly. At the end of the ninth month, the fetus is about 20 inches in length and weighs 6-10 pounds. Body changes during your third trimester Your body will continue to go through many changes during pregnancy. The changes vary from woman to woman. During the third trimester:  Your weight will continue to increase. You can expect to gain 25-35 pounds (11-16 kg) by the end of the pregnancy.  You may begin to get stretch marks on your hips, abdomen, and breasts.  You may urinate more often because the fetus is moving lower into your pelvis and pressing on your bladder.  You may develop or continue to have heartburn. This is caused by increased hormones that slow down muscles in the digestive tract.  You may develop or continue to have constipation because increased hormones slow digestion and cause the muscles that push waste through your intestines to relax.  You may develop hemorrhoids. These are swollen veins (varicose veins) in the rectum that can itch or be painful.  You may develop swollen, bulging veins (varicose veins) in your legs.  You may have increased body aches in the pelvis, back, or thighs. This is due to weight gain and increased hormones that are relaxing your joints.  You may have changes in your hair. These can include thickening of your hair, rapid growth, and changes in texture. Some women also have hair loss during or after pregnancy, or hair that feels dry or thin. Your hair will most likely return to normal after your baby is born.  Your breasts will continue to grow and they will continue to become tender. A yellow fluid (colostrum) may leak from your breasts. This is the first milk you are producing for your baby.  Your belly button may stick out.  You may notice more swelling in your hands,  face, or ankles.  You may have increased tingling or numbness in your hands, arms, and legs. The skin on your belly may also feel numb.  You may feel short of breath because of your expanding uterus.  You may have more problems sleeping. This can be caused by the size of your belly, increased need to urinate, and an increase in your body's metabolism.  You may notice the fetus "dropping," or moving lower in your abdomen (lightening).  You may have increased vaginal discharge.  You may notice your joints feel loose and you may have pain around your pelvic bone. What to expect at prenatal visits You will have prenatal exams every 2 weeks until week 36. Then you will have weekly prenatal exams. During a routine prenatal visit:  You will be weighed to make sure you and the baby are growing normally.  Your blood pressure will be taken.  Your abdomen will be measured to track your baby's growth.  The fetal heartbeat will be listened to.  Any test results from the previous visit will be discussed.  You may have a cervical check near your due date to see if your cervix has softened or thinned (effaced).  You will be tested for Group B streptococcus. This happens between 35 and 37 weeks. Your health care provider may ask you:  What your birth plan is.  How you are feeling.  If you are feeling the baby move.  If you have had any abnormal   symptoms, such as leaking fluid, bleeding, severe headaches, or abdominal cramping.  If you are using any tobacco products, including cigarettes, chewing tobacco, and electronic cigarettes.  If you have any questions. Other tests or screenings that may be performed during your third trimester include:  Blood tests that check for low iron levels (anemia).  Fetal testing to check the health, activity level, and growth of the fetus. Testing is done if you have certain medical conditions or if there are problems during the pregnancy.  Nonstress test  (NST). This test checks the health of your baby to make sure there are no signs of problems, such as the baby not getting enough oxygen. During this test, a belt is placed around your belly. The baby is made to move, and its heart rate is monitored during movement. What is false labor? False labor is a condition in which you feel small, irregular tightenings of the muscles in the womb (contractions) that usually go away with rest, changing position, or drinking water. These are called Braxton Hicks contractions. Contractions may last for hours, days, or even weeks before true labor sets in. If contractions come at regular intervals, become more frequent, increase in intensity, or become painful, you should see your health care provider. What are the signs of labor?  Abdominal cramps.  Regular contractions that start at 10 minutes apart and become stronger and more frequent with time.  Contractions that start on the top of the uterus and spread down to the lower abdomen and back.  Increased pelvic pressure and dull back pain.  A watery or bloody mucus discharge that comes from the vagina.  Leaking of amniotic fluid. This is also known as your "water breaking." It could be a slow trickle or a gush. Let your health care provider know if it has a color or strange odor. If you have any of these signs, call your health care provider right away, even if it is before your due date. Follow these instructions at home: Medicines  Follow your health care provider's instructions regarding medicine use. Specific medicines may be either safe or unsafe to take during pregnancy.  Take a prenatal vitamin that contains at least 600 micrograms (mcg) of folic acid.  If you develop constipation, try taking a stool softener if your health care provider approves. Eating and drinking   Eat a balanced diet that includes fresh fruits and vegetables, whole grains, good sources of protein such as meat, eggs, or tofu,  and low-fat dairy. Your health care provider will help you determine the amount of weight gain that is right for you.  Avoid raw meat and uncooked cheese. These carry germs that can cause birth defects in the baby.  If you have low calcium intake from food, talk to your health care provider about whether you should take a daily calcium supplement.  Eat four or five small meals rather than three large meals a day.  Limit foods that are high in fat and processed sugars, such as fried and sweet foods.  To prevent constipation: ? Drink enough fluid to keep your urine clear or pale yellow. ? Eat foods that are high in fiber, such as fresh fruits and vegetables, whole grains, and beans. Activity  Exercise only as directed by your health care provider. Most women can continue their usual exercise routine during pregnancy. Try to exercise for 30 minutes at least 5 days a week. Stop exercising if you experience uterine contractions.  Avoid heavy lifting.  Do   not exercise in extreme heat or humidity, or at high altitudes.  Wear low-heel, comfortable shoes.  Practice good posture.  You may continue to have sex unless your health care provider tells you otherwise. Relieving pain and discomfort  Take frequent breaks and rest with your legs elevated if you have leg cramps or low back pain.  Take warm sitz baths to soothe any pain or discomfort caused by hemorrhoids. Use hemorrhoid cream if your health care provider approves.  Wear a good support bra to prevent discomfort from breast tenderness.  If you develop varicose veins: ? Wear support pantyhose or compression stockings as told by your healthcare provider. ? Elevate your feet for 15 minutes, 3-4 times a day. Prenatal care  Write down your questions. Take them to your prenatal visits.  Keep all your prenatal visits as told by your health care provider. This is important. Safety  Wear your seat belt at all times when driving.  Make  a list of emergency phone numbers, including numbers for family, friends, the hospital, and police and fire departments. General instructions  Avoid cat litter boxes and soil used by cats. These carry germs that can cause birth defects in the baby. If you have a cat, ask someone to clean the litter box for you.  Do not travel far distances unless it is absolutely necessary and only with the approval of your health care provider.  Do not use hot tubs, steam rooms, or saunas.  Do not drink alcohol.  Do not use any products that contain nicotine or tobacco, such as cigarettes and e-cigarettes. If you need help quitting, ask your health care provider.  Do not use any medicinal herbs or unprescribed drugs. These chemicals affect the formation and growth of the baby.  Do not douche or use tampons or scented sanitary pads.  Do not cross your legs for long periods of time.  To prepare for the arrival of your baby: ? Take prenatal classes to understand, practice, and ask questions about labor and delivery. ? Make a trial run to the hospital. ? Visit the hospital and tour the maternity area. ? Arrange for maternity or paternity leave through employers. ? Arrange for family and friends to take care of pets while you are in the hospital. ? Purchase a rear-facing car seat and make sure you know how to install it in your car. ? Pack your hospital bag. ? Prepare the baby's nursery. Make sure to remove all pillows and stuffed animals from the baby's crib to prevent suffocation.  Visit your dentist if you have not gone during your pregnancy. Use a soft toothbrush to brush your teeth and be gentle when you floss. Contact a health care provider if:  You are unsure if you are in labor or if your water has broken.  You become dizzy.  You have mild pelvic cramps, pelvic pressure, or nagging pain in your abdominal area.  You have lower back pain.  You have persistent nausea, vomiting, or diarrhea.   You have an unusual or bad smelling vaginal discharge.  You have pain when you urinate. Get help right away if:  Your water breaks before 37 weeks.  You have regular contractions less than 5 minutes apart before 37 weeks.  You have a fever.  You are leaking fluid from your vagina.  You have spotting or bleeding from your vagina.  You have severe abdominal pain or cramping.  You have rapid weight loss or weight gain.  You have   shortness of breath with chest pain.  You notice sudden or extreme swelling of your face, hands, ankles, feet, or legs.  Your baby makes fewer than 10 movements in 2 hours.  You have severe headaches that do not go away when you take medicine.  You have vision changes. Summary  The third trimester is from week 28 through week 40, months 7 through 9. The third trimester is a time when the unborn baby (fetus) is growing rapidly.  During the third trimester, your discomfort may increase as you and your baby continue to gain weight. You may have abdominal, leg, and back pain, sleeping problems, and an increased need to urinate.  During the third trimester your breasts will keep growing and they will continue to become tender. A yellow fluid (colostrum) may leak from your breasts. This is the first milk you are producing for your baby.  False labor is a condition in which you feel small, irregular tightenings of the muscles in the womb (contractions) that eventually go away. These are called Braxton Hicks contractions. Contractions may last for hours, days, or even weeks before true labor sets in.  Signs of labor can include: abdominal cramps; regular contractions that start at 10 minutes apart and become stronger and more frequent with time; watery or bloody mucus discharge that comes from the vagina; increased pelvic pressure and dull back pain; and leaking of amniotic fluid. This information is not intended to replace advice given to you by your health  care provider. Make sure you discuss any questions you have with your health care provider. Document Released: 12/03/2001 Document Revised: 04/01/2019 Document Reviewed: 01/14/2017 Elsevier Patient Education  2020 [employer]  

## 2019-10-14 ENCOUNTER — Encounter: Payer: Self-pay | Admitting: Obstetrics & Gynecology

## 2019-10-14 ENCOUNTER — Ambulatory Visit (INDEPENDENT_AMBULATORY_CARE_PROVIDER_SITE_OTHER): Payer: Medicaid Other | Admitting: Obstetrics & Gynecology

## 2019-10-14 ENCOUNTER — Other Ambulatory Visit: Payer: Self-pay

## 2019-10-14 VITALS — BP 100/60 | Wt 193.0 lb

## 2019-10-14 DIAGNOSIS — O34219 Maternal care for unspecified type scar from previous cesarean delivery: Secondary | ICD-10-CM

## 2019-10-14 DIAGNOSIS — Z98891 History of uterine scar from previous surgery: Secondary | ICD-10-CM

## 2019-10-14 DIAGNOSIS — Z3A34 34 weeks gestation of pregnancy: Secondary | ICD-10-CM

## 2019-10-14 DIAGNOSIS — O0993 Supervision of high risk pregnancy, unspecified, third trimester: Secondary | ICD-10-CM

## 2019-10-14 DIAGNOSIS — O0990 Supervision of high risk pregnancy, unspecified, unspecified trimester: Secondary | ICD-10-CM

## 2019-10-14 LAB — POCT URINALYSIS DIPSTICK OB
Glucose, UA: NEGATIVE
POC,PROTEIN,UA: NEGATIVE

## 2019-10-14 NOTE — Patient Instructions (Signed)
Third Trimester of Pregnancy The third trimester is from week 28 through week 40 (months 7 through 9). The third trimester is a time when the unborn baby (fetus) is growing rapidly. At the end of the ninth month, the fetus is about 20 inches in length and weighs 6-10 pounds. Body changes during your third trimester Your body will continue to go through many changes during pregnancy. The changes vary from woman to woman. During the third trimester:  Your weight will continue to increase. You can expect to gain 25-35 pounds (11-16 kg) by the end of the pregnancy.  You may begin to get stretch marks on your hips, abdomen, and breasts.  You may urinate more often because the fetus is moving lower into your pelvis and pressing on your bladder.  You may develop or continue to have heartburn. This is caused by increased hormones that slow down muscles in the digestive tract.  You may develop or continue to have constipation because increased hormones slow digestion and cause the muscles that push waste through your intestines to relax.  You may develop hemorrhoids. These are swollen veins (varicose veins) in the rectum that can itch or be painful.  You may develop swollen, bulging veins (varicose veins) in your legs.  You may have increased body aches in the pelvis, back, or thighs. This is due to weight gain and increased hormones that are relaxing your joints.  You may have changes in your hair. These can include thickening of your hair, rapid growth, and changes in texture. Some women also have hair loss during or after pregnancy, or hair that feels dry or thin. Your hair will most likely return to normal after your baby is born.  Your breasts will continue to grow and they will continue to become tender. A yellow fluid (colostrum) may leak from your breasts. This is the first milk you are producing for your baby.  Your belly button may stick out.  You may notice more swelling in your hands,  face, or ankles.  You may have increased tingling or numbness in your hands, arms, and legs. The skin on your belly may also feel numb.  You may feel short of breath because of your expanding uterus.  You may have more problems sleeping. This can be caused by the size of your belly, increased need to urinate, and an increase in your body's metabolism.  You may notice the fetus "dropping," or moving lower in your abdomen (lightening).  You may have increased vaginal discharge.  You may notice your joints feel loose and you may have pain around your pelvic bone. What to expect at prenatal visits You will have prenatal exams every 2 weeks until week 36. Then you will have weekly prenatal exams. During a routine prenatal visit:  You will be weighed to make sure you and the baby are growing normally.  Your blood pressure will be taken.  Your abdomen will be measured to track your baby's growth.  The fetal heartbeat will be listened to.  Any test results from the previous visit will be discussed.  You may have a cervical check near your due date to see if your cervix has softened or thinned (effaced).  You will be tested for Group B streptococcus. This happens between 35 and 37 weeks. Your health care provider may ask you:  What your birth plan is.  How you are feeling.  If you are feeling the baby move.  If you have had any abnormal   symptoms, such as leaking fluid, bleeding, severe headaches, or abdominal cramping.  If you are using any tobacco products, including cigarettes, chewing tobacco, and electronic cigarettes.  If you have any questions. Other tests or screenings that may be performed during your third trimester include:  Blood tests that check for low iron levels (anemia).  Fetal testing to check the health, activity level, and growth of the fetus. Testing is done if you have certain medical conditions or if there are problems during the pregnancy.  Nonstress test  (NST). This test checks the health of your baby to make sure there are no signs of problems, such as the baby not getting enough oxygen. During this test, a belt is placed around your belly. The baby is made to move, and its heart rate is monitored during movement. What is false labor? False labor is a condition in which you feel small, irregular tightenings of the muscles in the womb (contractions) that usually go away with rest, changing position, or drinking water. These are called Braxton Hicks contractions. Contractions may last for hours, days, or even weeks before true labor sets in. If contractions come at regular intervals, become more frequent, increase in intensity, or become painful, you should see your health care provider. What are the signs of labor?  Abdominal cramps.  Regular contractions that start at 10 minutes apart and become stronger and more frequent with time.  Contractions that start on the top of the uterus and spread down to the lower abdomen and back.  Increased pelvic pressure and dull back pain.  A watery or bloody mucus discharge that comes from the vagina.  Leaking of amniotic fluid. This is also known as your "water breaking." It could be a slow trickle or a gush. Let your health care provider know if it has a color or strange odor. If you have any of these signs, call your health care provider right away, even if it is before your due date. Follow these instructions at home: Medicines  Follow your health care provider's instructions regarding medicine use. Specific medicines may be either safe or unsafe to take during pregnancy.  Take a prenatal vitamin that contains at least 600 micrograms (mcg) of folic acid.  If you develop constipation, try taking a stool softener if your health care provider approves. Eating and drinking   Eat a balanced diet that includes fresh fruits and vegetables, whole grains, good sources of protein such as meat, eggs, or tofu,  and low-fat dairy. Your health care provider will help you determine the amount of weight gain that is right for you.  Avoid raw meat and uncooked cheese. These carry germs that can cause birth defects in the baby.  If you have low calcium intake from food, talk to your health care provider about whether you should take a daily calcium supplement.  Eat four or five small meals rather than three large meals a day.  Limit foods that are high in fat and processed sugars, such as fried and sweet foods.  To prevent constipation: ? Drink enough fluid to keep your urine clear or pale yellow. ? Eat foods that are high in fiber, such as fresh fruits and vegetables, whole grains, and beans. Activity  Exercise only as directed by your health care provider. Most women can continue their usual exercise routine during pregnancy. Try to exercise for 30 minutes at least 5 days a week. Stop exercising if you experience uterine contractions.  Avoid heavy lifting.  Do   not exercise in extreme heat or humidity, or at high altitudes.  Wear low-heel, comfortable shoes.  Practice good posture.  You may continue to have sex unless your health care provider tells you otherwise. Relieving pain and discomfort  Take frequent breaks and rest with your legs elevated if you have leg cramps or low back pain.  Take warm sitz baths to soothe any pain or discomfort caused by hemorrhoids. Use hemorrhoid cream if your health care provider approves.  Wear a good support bra to prevent discomfort from breast tenderness.  If you develop varicose veins: ? Wear support pantyhose or compression stockings as told by your healthcare provider. ? Elevate your feet for 15 minutes, 3-4 times a day. Prenatal care  Write down your questions. Take them to your prenatal visits.  Keep all your prenatal visits as told by your health care provider. This is important. Safety  Wear your seat belt at all times when driving.  Make  a list of emergency phone numbers, including numbers for family, friends, the hospital, and police and fire departments. General instructions  Avoid cat litter boxes and soil used by cats. These carry germs that can cause birth defects in the baby. If you have a cat, ask someone to clean the litter box for you.  Do not travel far distances unless it is absolutely necessary and only with the approval of your health care provider.  Do not use hot tubs, steam rooms, or saunas.  Do not drink alcohol.  Do not use any products that contain nicotine or tobacco, such as cigarettes and e-cigarettes. If you need help quitting, ask your health care provider.  Do not use any medicinal herbs or unprescribed drugs. These chemicals affect the formation and growth of the baby.  Do not douche or use tampons or scented sanitary pads.  Do not cross your legs for long periods of time.  To prepare for the arrival of your baby: ? Take prenatal classes to understand, practice, and ask questions about labor and delivery. ? Make a trial run to the hospital. ? Visit the hospital and tour the maternity area. ? Arrange for maternity or paternity leave through employers. ? Arrange for family and friends to take care of pets while you are in the hospital. ? Purchase a rear-facing car seat and make sure you know how to install it in your car. ? Pack your hospital bag. ? Prepare the baby's nursery. Make sure to remove all pillows and stuffed animals from the baby's crib to prevent suffocation.  Visit your dentist if you have not gone during your pregnancy. Use a soft toothbrush to brush your teeth and be gentle when you floss. Contact a health care provider if:  You are unsure if you are in labor or if your water has broken.  You become dizzy.  You have mild pelvic cramps, pelvic pressure, or nagging pain in your abdominal area.  You have lower back pain.  You have persistent nausea, vomiting, or diarrhea.   You have an unusual or bad smelling vaginal discharge.  You have pain when you urinate. Get help right away if:  Your water breaks before 37 weeks.  You have regular contractions less than 5 minutes apart before 37 weeks.  You have a fever.  You are leaking fluid from your vagina.  You have spotting or bleeding from your vagina.  You have severe abdominal pain or cramping.  You have rapid weight loss or weight gain.  You have   shortness of breath with chest pain.  You notice sudden or extreme swelling of your face, hands, ankles, feet, or legs.  Your baby makes fewer than 10 movements in 2 hours.  You have severe headaches that do not go away when you take medicine.  You have vision changes. Summary  The third trimester is from week 28 through week 40, months 7 through 9. The third trimester is a time when the unborn baby (fetus) is growing rapidly.  During the third trimester, your discomfort may increase as you and your baby continue to gain weight. You may have abdominal, leg, and back pain, sleeping problems, and an increased need to urinate.  During the third trimester your breasts will keep growing and they will continue to become tender. A yellow fluid (colostrum) may leak from your breasts. This is the first milk you are producing for your baby.  False labor is a condition in which you feel small, irregular tightenings of the muscles in the womb (contractions) that eventually go away. These are called Braxton Hicks contractions. Contractions may last for hours, days, or even weeks before true labor sets in.  Signs of labor can include: abdominal cramps; regular contractions that start at 10 minutes apart and become stronger and more frequent with time; watery or bloody mucus discharge that comes from the vagina; increased pelvic pressure and dull back pain; and leaking of amniotic fluid. This information is not intended to replace advice given to you by your health  care provider. Make sure you discuss any questions you have with your health care provider. Document Released: 12/03/2001 Document Revised: 04/01/2019 Document Reviewed: 01/14/2017 Elsevier Patient Education  2020 [employer]  

## 2019-10-14 NOTE — Addendum Note (Signed)
Addended by: Quintella Baton D on: 10/14/2019 11:56 AM   Modules accepted: Orders

## 2019-10-14 NOTE — Progress Notes (Signed)
  Subjective  Fetal Movement? yes Contractions? no Leaking Fluid? no Vaginal Bleeding? no  Objective  BP 100/60   Wt 193 lb (87.5 kg)   LMP 02/20/2019   BMI 34.19 kg/m  General: NAD Pumonary: no increased work of breathing Abdomen: gravid, non-tender Extremities: no edema Psychiatric: mood appropriate, affect full  Assessment  23 y.o. [id number] at [redacted]w[redacted]d by  11/25/2019, by Ultrasound presenting for routine prenatal visit  Plan   Problem List Items Addressed This Visit      Other   History of 3 cesarean sections   Supervision of high risk pregnancy, antepartum    Other Visit Diagnoses    [redacted] weeks gestation of pregnancy    -  Primary      pregnancy 4  Problems (from 02/20/19 to present)    Problem Noted Resolved   Supervision of high risk pregnancy, antepartum 03/28/2019 by [name], CNM No   Overview Addendum 09/14/2019 12:10 PM by Homero Fellers, MD    [facility] P[facility]atal Labs  Dating 11 week [place] Blood type: O/Positive/-- (05/18 1348)   Genetic Screen NIPS: diploid XY Antibody:Negative (05/18 1348)  Anatomic [place] Normal anatomy, anterior placenta Rubella: 1.96 (05/18 1348) Varicella:  Immune  GTT Early:     67           Third trimester: 124 RPR: Non Reactive (05/18 1348)   Rhogam  not needed HBsAg: Negative (05/18 1348)   TDaP vaccine   9/22     Flu Shot:no HIV: Non Reactive (05/18 1348)   Baby Food                                GBS: nv  Contraception Depo Pap: 09/03/2016, NILM  CBB  No   CS/VBAC CS ([place])   Support Person                  Barnett Applebaum, MD, [facility], [facility] 10/14/2019  11:53 AM

## 2019-10-20 ENCOUNTER — Telehealth: Payer: Self-pay

## 2019-10-20 ENCOUNTER — Encounter: Payer: Self-pay | Admitting: Certified Nurse Midwife

## 2019-10-20 NOTE — Telephone Encounter (Signed)
I just saw this it 7:45 can someone address tomorrow

## 2019-10-20 NOTE — Telephone Encounter (Signed)
Patient received notification to check her my chart. One of her notices was for a previous u/s she had which shows a right ovarian cyst. Patient states she was not informed of this and is requesting someone contact her to discuss. GM#[phone]

## 2019-10-21 ENCOUNTER — Telehealth: Payer: Self-pay | Admitting: Obstetrics and Gynecology

## 2019-10-21 NOTE — Telephone Encounter (Signed)
Pt called back returning your phone call  

## 2019-10-21 NOTE — Telephone Encounter (Signed)
I called the patient but she did not answer. Left a message. If she calls I would be happy to discuss Korea result with her.

## 2019-10-22 NOTE — Telephone Encounter (Signed)
Called and discussed with patient- she has not had pain, will plan to observe and will be able to see cyst during cesarean section.

## 2019-10-29 ENCOUNTER — Other Ambulatory Visit (HOSPITAL_COMMUNITY)
Admission: RE | Admit: 2019-10-29 | Discharge: 2019-10-29 | Disposition: A | Discharge status: Home / Self Care | Payer: Medicaid Other | Source: Ambulatory Visit | Attending: Obstetrics & Gynecology | Admitting: Obstetrics & Gynecology

## 2019-10-29 ENCOUNTER — Encounter: Payer: Self-pay | Admitting: Obstetrics & Gynecology

## 2019-10-29 ENCOUNTER — Ambulatory Visit (INDEPENDENT_AMBULATORY_CARE_PROVIDER_SITE_OTHER): Payer: Medicaid Other | Admitting: Obstetrics & Gynecology

## 2019-10-29 ENCOUNTER — Other Ambulatory Visit: Payer: Self-pay

## 2019-10-29 VITALS — BP 122/80 | Wt 196.0 lb

## 2019-10-29 DIAGNOSIS — Z113 Encounter for screening for infections with a predominantly sexual mode of transmission: Secondary | ICD-10-CM | POA: Insufficient documentation

## 2019-10-29 DIAGNOSIS — Z3A36 36 weeks gestation of pregnancy: Secondary | ICD-10-CM

## 2019-10-29 DIAGNOSIS — Z3685 Encounter for antenatal screening for Streptococcus B: Secondary | ICD-10-CM

## 2019-10-29 DIAGNOSIS — Z98891 History of uterine scar from previous surgery: Secondary | ICD-10-CM

## 2019-10-29 DIAGNOSIS — O0990 Supervision of high risk pregnancy, unspecified, unspecified trimester: Secondary | ICD-10-CM

## 2019-10-29 DIAGNOSIS — O34219 Maternal care for unspecified type scar from previous cesarean delivery: Secondary | ICD-10-CM

## 2019-10-29 DIAGNOSIS — O0993 Supervision of high risk pregnancy, unspecified, third trimester: Secondary | ICD-10-CM

## 2019-10-29 NOTE — Progress Notes (Signed)
  Subjective  Fetal Movement? yes Contractions? no Leaking Fluid? no Vaginal Bleeding? no  Objective  BP 122/80   Wt 196 lb (88.9 kg)   LMP 02/20/2019   BMI 34.72 kg/m  General: NAD Pumonary: no increased work of breathing Abdomen: gravid, non-tender Extremities: no edema Psychiatric: mood appropriate, affect full SVE closed cervix Assessment  23 y.o. [id number] at [redacted]w[redacted]d by  11/25/2019, by Ultrasound presenting for routine prenatal visit  Plan   Problem List Items Addressed This Visit      Other   History of 3 cesarean sections   Supervision of high risk pregnancy, antepartum    Other Visit Diagnoses    [redacted] weeks gestation of pregnancy    -  Primary   Encounter for antenatal screening for Streptococcus B       Relevant Orders   GBS RV Culture   Screen for STD (sexually transmitted disease)       Relevant Orders   Cervicovaginal ancillary only      pregnancy 4  Problems (from 02/20/19 to present)    Problem Noted Resolved   Supervision of high risk pregnancy, antepartum 03/28/2019 by [name], CNM No   Overview Addendum 09/14/2019 12:10 PM by Homero Fellers, MD    [facility] Prenatal Labs  Dating 11 week [place] Blood type: O/Positive/-- (05/18 1348)   Genetic Screen NIPS: diploid XY Antibody:Negative (05/18 1348)  Anatomic [place] Normal anatomy, anterior placenta Rubella: 1.96 (05/18 1348) Varicella:  Immune  GTT Early:     67           Third trimester: 124 RPR: Non Reactive (05/18 1348)   Rhogam  not needed HBsAg: Negative (05/18 1348)   TDaP vaccine     9/22   Flu Shot:declined HIV: Non Reactive (05/18 1348)   Baby Food                                GBS: today  Contraception Depo Pap: 09/03/2016, NILM  CBB  No   CS/VBAC CS ([place])   Support Person                  Barnett Applebaum, MD, [facility], [facility] 10/29/2019  10:33 AM

## 2019-10-29 NOTE — Patient Instructions (Signed)
Group B Streptococcus Infection During Pregnancy  Group B Streptococcus (GBS) is a type of bacteria (Streptococcus agalactiae) that is often found in healthy people, commonly in the rectum, vagina, and intestines. In people who are healthy and not pregnant, the bacteria rarely cause serious illness or complications. However, women who test positive for GBS during pregnancy can pass the bacteria to their baby during childbirth, which can cause serious infection in the baby after birth. Women with GBS may also have infections during their pregnancy or immediately after childbirth, such as urinary tract infections (UTIs) or infections of the uterus (uterine infections). Having GBS also increases a woman's risk of complications during pregnancy, such as early (preterm) labor or delivery, miscarriage, or stillbirth. Routine testing (screening) for GBS is recommended for all pregnant women. What increases the risk? You may have a higher risk for GBS infection during pregnancy if you had one during a past pregnancy. What are the signs or symptoms? In most cases, GBS infection does not cause symptoms in pregnant women. Signs and symptoms of a possible GBS-related infection may include:  Labor starting before the 37th week of pregnancy.  A UTI or bladder infection, which may cause: ? Fever. ? Pain or burning during urination. ? Frequent urination.  Fever during labor, along with: ? Bad-smelling discharge. ? Uterine tenderness. ? Rapid heartbeat in the mother, baby, or both. Rare but serious symptoms of a possible GBS-related infection in women include:  Blood infection (septicemia). This may cause fever, chills, or confusion.  Lung infection (pneumonia). This may cause fever, chills, cough, rapid breathing, difficulty breathing, or chest pain.  Bone, joint, skin, or soft tissue infection. How is this diagnosed? You may be screened for GBS between week 35 and week 37 of your pregnancy. If you have  symptoms of preterm labor, you may be screened earlier. This condition is diagnosed based on lab test results from:  A swab of fluid from the vagina and rectum.  A urine sample. How is this treated? This condition is treated with antibiotic medicine. When you go into labor, or as soon as your water breaks (your membranes rupture), you will be given antibiotics through an IV tube. Antibiotics will continue until after you give birth. If you are having a cesarean delivery, you do not need antibiotics unless your membranes have already ruptured. Follow these instructions at home:  Take over-the-counter and prescription medicines only as told by your health care provider.  Take your antibiotic medicine as told by your health care provider. Do not stop taking the antibiotic even if you start to feel better.  Keep all pre-birth (prenatal) visits and follow-up visits as told by your health care provider. This is important. Contact a health care provider if:  You have pain or burning when you urinate.  You have to urinate frequently.  You have a fever or chills.  You develop a bad-smelling vaginal discharge. Get help right away if:  Your membranes rupture.  You go into labor.  You have severe pain in your abdomen.  You have difficulty breathing.  You have chest pain. This information is not intended to replace advice given to you by your health care provider. Make sure you discuss any questions you have with your health care provider. Document Released: 03/17/2008 Document Revised: 04/01/2019 Document Reviewed: 07/04/2016 Elsevier Patient Education  2020 Elsevier Inc.  

## 2019-11-01 LAB — CERVICOVAGINAL ANCILLARY ONLY
Chlamydia: NEGATIVE
Comment: NEGATIVE
Comment: NORMAL
Neisseria Gonorrhea: NEGATIVE

## 2019-11-02 LAB — CULTURE, BETA STREP (GROUP B ONLY): Strep Gp B Culture: NEGATIVE

## 2019-11-04 ENCOUNTER — Encounter: Payer: Medicaid Other | Admitting: Obstetrics and Gynecology

## 2019-11-08 ENCOUNTER — Encounter: Payer: Self-pay | Admitting: Obstetrics & Gynecology

## 2019-11-08 ENCOUNTER — Ambulatory Visit (INDEPENDENT_AMBULATORY_CARE_PROVIDER_SITE_OTHER): Payer: Medicaid Other | Admitting: Obstetrics & Gynecology

## 2019-11-08 ENCOUNTER — Other Ambulatory Visit: Payer: Self-pay

## 2019-11-08 VITALS — BP 100/60 | Wt 201.0 lb

## 2019-11-08 DIAGNOSIS — Z98891 History of uterine scar from previous surgery: Secondary | ICD-10-CM

## 2019-11-08 DIAGNOSIS — O34219 Maternal care for unspecified type scar from previous cesarean delivery: Secondary | ICD-10-CM

## 2019-11-08 DIAGNOSIS — Z3A37 37 weeks gestation of pregnancy: Secondary | ICD-10-CM

## 2019-11-08 LAB — POCT URINALYSIS DIPSTICK OB
Glucose, UA: NEGATIVE
POC,PROTEIN,UA: NEGATIVE

## 2019-11-08 NOTE — Progress Notes (Signed)
  Subjective  Fetal Movement? yes Contractions? No although some back pains that are intermittent Leaking Fluid? no Vaginal Bleeding? no  Objective  BP 100/60   Wt 201 lb (91.2 kg)   LMP 02/20/2019   BMI 35.61 kg/m  General: NAD Pumonary: no increased work of breathing Abdomen: gravid, non-tender Extremities: no edema Psychiatric: mood appropriate, affect full  Assessment  23 y.o. [id number] at [redacted]w[redacted]d by  11/25/2019, by Ultrasound presenting for routine prenatal visit  Plan   Problem List Items Addressed This Visit      Other   History of 3 cesarean sections    Other Visit Diagnoses    [redacted] weeks gestation of pregnancy    -  Primary   Relevant Orders   POC Urinalysis Dipstick OB (Completed)      pregnancy 4  Problems (from 02/20/19 to present)    Problem Noted Resolved   Supervision of high risk pregnancy, antepartum 03/28/2019 by [name], CNM No   Overview Addendum 10/29/2019 10:34 AM by [name], MD    [facility] Prenatal Labs  Dating 11 week [place] Blood type: O/Positive/-- (05/18 1348)   Genetic Screen NIPS: diploid XY Antibody:Negative (05/18 1348)  Anatomic [place] Normal anatomy, anterior placenta Rubella: 1.96 (05/18 1348) Varicella:  Immune  GTT Early:     67           Third trimester: 124 RPR: Non Reactive (05/18 1348)   Rhogam  not needed HBsAg: Negative (05/18 1348)   TDaP vaccine  9/22      Flu Shot:declined HIV: Non Reactive (05/18 1348)   Baby Food                                GBS:   Contraception DEPO Pap: 09/03/2016, NILM  CBB  No   CS/VBAC CS ([place])   Support Person                Labor precautions disacussed  Barnett Applebaum, MD, [facility], [facility] 11/08/2019  5:13 PM

## 2019-11-09 ENCOUNTER — Telehealth: Payer: Self-pay

## 2019-11-09 ENCOUNTER — Observation Stay
Admission: EM | Admit: 2019-11-09 | Discharge: 2019-11-09 | Disposition: A | Discharge status: Home / Self Care | Payer: Medicaid Other | Attending: Obstetrics & Gynecology | Admitting: Obstetrics & Gynecology

## 2019-11-09 ENCOUNTER — Other Ambulatory Visit: Payer: Self-pay

## 2019-11-09 DIAGNOSIS — O99613 Diseases of the digestive system complicating pregnancy, third trimester: Secondary | ICD-10-CM | POA: Insufficient documentation

## 2019-11-09 DIAGNOSIS — Z79899 Other long term (current) drug therapy: Secondary | ICD-10-CM | POA: Diagnosis not present

## 2019-11-09 DIAGNOSIS — M545 Low back pain, unspecified: Secondary | ICD-10-CM | POA: Diagnosis present

## 2019-11-09 DIAGNOSIS — O26893 Other specified pregnancy related conditions, third trimester: Principal | ICD-10-CM | POA: Insufficient documentation

## 2019-11-09 DIAGNOSIS — Z3A37 37 weeks gestation of pregnancy: Secondary | ICD-10-CM | POA: Insufficient documentation

## 2019-11-09 DIAGNOSIS — K219 Gastro-esophageal reflux disease without esophagitis: Secondary | ICD-10-CM | POA: Insufficient documentation

## 2019-11-09 DIAGNOSIS — O0990 Supervision of high risk pregnancy, unspecified, unspecified trimester: Secondary | ICD-10-CM

## 2019-11-09 DIAGNOSIS — O34211 Maternal care for low transverse scar from previous cesarean delivery: Secondary | ICD-10-CM | POA: Diagnosis not present

## 2019-11-09 DIAGNOSIS — O471 False labor at or after 37 completed weeks of gestation: Secondary | ICD-10-CM | POA: Diagnosis not present

## 2019-11-09 DIAGNOSIS — O26899 Other specified pregnancy related conditions, unspecified trimester: Secondary | ICD-10-CM | POA: Diagnosis present

## 2019-11-09 LAB — URINALYSIS, ROUTINE W REFLEX MICROSCOPIC
Bilirubin Urine: NEGATIVE
Glucose, UA: NEGATIVE mg/dL
Hgb urine dipstick: NEGATIVE
Ketones, ur: NEGATIVE mg/dL
Leukocytes,Ua: NEGATIVE
Nitrite: NEGATIVE
Protein, ur: NEGATIVE mg/dL
Specific Gravity, Urine: 1.006 (ref 1.005–1.030)
pH: 7 (ref 5.0–8.0)

## 2019-11-09 MED ORDER — ACETAMINOPHEN 325 MG PO TABS: 650.0000 mg | ORAL_TABLET | ORAL | Status: DC | PRN

## 2019-11-09 MED ORDER — LIDOCAINE HCL (PF) 1 % IJ SOLN: 30.0000 mL | INTRAMUSCULAR | Status: DC | PRN

## 2019-11-09 MED ORDER — ONDANSETRON HCL 4 MG/2ML IJ SOLN: 4.0000 mg | Freq: Four times a day (QID) | INTRAMUSCULAR | Status: DC | PRN

## 2019-11-09 NOTE — Progress Notes (Signed)
Pt d/c home per P.Kenton Kingfisher, MD order. Pt received AVS and labor precautions and when to return to the ED.  Pt verbalized understanding. Pt d/c home with significant other via personal vehicle.

## 2019-11-09 NOTE — Progress Notes (Signed)
[facility] [place] [id number] [place] [address] Phone: [phone] Fax: [phone]  November 09, 2019  Patient: Carla Ingram, Carla Ingram  Date of Birth: 09-01-1996  Date of Visit: 11/09/2019    To Whom It May Concern:  Nadeen Landau along with her Carla Tommie Raymond was seen and treated in our Labor and [facility] on 11/09/2019. Vincentina Sollers  may return to work on tomorrow.  Sincerely,  Barnett Applebaum, MD [facility]

## 2019-11-09 NOTE — Telephone Encounter (Signed)
Pt calling; has questions and concerns.  [phone]  Pt states is having a lot of cramping, lots of pressure so much she can hardly walk and it took her to her knees earlier this morning.  Adv to go to L&D via ED.  Baker Janus aware.

## 2019-11-09 NOTE — OB Triage Note (Signed)
Pt presents to the ED c/o cramping and pelvic pessure that began last night at 2100 and become progressively worse. Pt states they are short episodes lasting about 20-30 seconds. Pt denies leaking of fluid or vaginal bleeding. States positive fetal movement. Pt is a [id number] [redacted]w[redacted]d. Pt had 3 prior c/s and desires a repeat with this pregnancy. External monitors applied and assessing. Initial FHR 155. VSS. P.Harris, MD aware of pt

## 2019-11-09 NOTE — Final Progress Note (Signed)
Physician Final Progress Note  Patient ID: Carla Ingram MRN: [id number] DOB/AGE: [birth year] 23 y.o.  Admit date: 11/09/2019 Admitting provider: [name], MD Discharge date: 11/09/2019  Admission Diagnoses:   Low back pain during pregnancy, antepartum  Discharge Diagnoses:  Active Problems:   Low back pain during pregnancy, antepartum  Consults: None  Significant Findings/ Diagnostic Studies:  Obstetrics Admission History & Physical   Abdominal Pain and Contractions   HPI:  23 y.o. O1B5102 @ [redacted]w[redacted]d (11/25/2019, by Ultrasound). Admitted on 11/09/2019:   Patient Active Problem List   Diagnosis Date Noted  . Low back pain during pregnancy, antepartum 11/09/2019  . Back pain affecting pregnancy, antepartum 08/02/2019  . Supervision of high risk pregnancy, antepartum 03/28/2019  . History of 3 cesarean sections 07/09/2016  . B12 deficiency 11/01/2015  . Tobacco use disorder 11/01/2015  . 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 Other psoriasis 02/26/2010     Presents for mostly low back pains and some ctx pains that began last night 9pm and persisted into morning. Denies VB or ROM. 3 prior CS, plans repeat at 39 weeks.  Prenatal care at: at [facility]. Pregnancy complicated by none.  ROS: A review of systems was performed and negative, except as stated in the above HPI. PMHx:  Past Medical History:  Diagnosis Date  . Anemia   . GERD (gastroesophageal reflux disease)   . Migraine   . Placental abruption    2012 at 35 wks  . Positive pregnancy test   . Psoriasis   . TMJ (dislocation of temporomandibular joint)    PSHx:  Past Surgical History:  Procedure Laterality Date  . CESAREAN SECTION  2012, 2016  . CESAREAN SECTION N/A 07/09/2016   Procedure: CESAREAN SECTION;  Surgeon: [name], MD;  Location: [facility] ORS;  Service: Obstetrics;  Laterality: N/A;  . DILATION AND CURETTAGE OF UTERUS  2009   Medications:  Medications Prior to Admission   Medication Sig Dispense Refill Last Dose  . acetaminophen (TYLENOL) 325 MG tablet Take 650 mg by mouth every 6 (six) hours as needed for moderate pain.   Past Week at Unknown time  . Prenatal Vit-Fe Fumarate-FA (MULTIVITAMIN-PRENATAL) 27-0.8 MG TABS tablet Take 1 tablet by mouth daily at 12 noon.   11/08/2019 at Unknown time  . cyclobenzaprine (FLEXERIL) 10 MG tablet Take 1 tablet (10 mg total) by mouth 3 (three) times daily as needed for muscle spasms. (Patient not taking: Reported on 11/04/2019) 30 tablet 0 More than a month at Unknown time   Allergies: is allergic to macrobid [nitrofurantoin macrocrystal]; augmentin [amoxicillin-pot clavulanate]; cephalexin; and penicillins. OBHx:  OB History  Gravida Para Term Preterm AB Living  4 3 2 1   3   SAB TAB Ectopic Multiple Live Births        0 3    # Outcome Date GA Lbr Len/2nd Weight Sex Delivery Anes PTL Lv  4 Current           3 Term 07/09/16 [redacted]w[redacted]d  3330 g F CS-LTranv Spinal  LIV  2 Term 01/19/15 [redacted]w[redacted]d  3005 g F CS-LTranv Spinal  LIV  1 Preterm 02/02/11 [redacted]w[redacted]d  2410 g F CS-LTranv Gen  LIV     Complications: Abruptio Placenta   HEN:IDPOEUMP/NTIRWERXVQMG except as detailed in HPI.Marland Kitchen  No family history of birth defects. Soc Hx: Alcohol: none and Recreational drug use: none  Objective:   Vitals:   11/09/19 1105  BP: 117/63  Pulse: 92  Resp: 18  Temp:  98.2 F (36.8 C)   Constitutional: Well nourished, well developed female in no acute distress.  HEENT: normal Skin: Warm and dry.  Cardiovascular:Regular rate and rhythm.   Extremity: trace to 1+ bilateral pedal edema Respiratory: Clear to auscultation bilateral. Normal respiratory effort Abdomen: gravid, ND, FHT present, mild tenderness on exam Back: no CVAT Neuro: DTRs 2+, Cranial nerves grossly intact Psych: Alert and Oriented x3. No memory deficits. Normal mood and affect.  MS: normal gait, normal bilateral lower extremity ROM/strength/stability.  Pelvic exam: is not limited  by body habitus EGBUS: within normal limits Vagina: within normal limits and with normal mucosa Cervix: CERVIX: 0 cm dilated, 30 effaced, -3 station, VTX Uterus: Uterus demonstrates irritability pattern.  Adnexa: not evaluated  EFM:FHR:  150 bpm, variability: moderate,  accelerations:  Present,  decelerations:  Absent Toco: None   Perinatal info:  Blood type: O positive Rubella- Immune Varicella -Immune TDaP Given during third trimester of this pregnancy RPR NR / HIV Neg/ HBsAg Neg   Assessment & Plan:   23 y.o. R4B6384 @ [redacted]w[redacted]d, Admitted on 11/09/2019: Low back pain and false labor    Procedures: A NST procedure was performed with FHR monitoring and a normal baseline established, appropriate time of 20-40 minutes of evaluation, and accels >2 seen w 15x15 characteristics.  Results show a REACTIVE NST.   Discharge Condition: good  Disposition: Discharge disposition: 01-Home or Self Care     Home; monitoring for active labor symptoms counseled to pt  Diet: Regular diet  Discharge Activity: Activity as tolerated  Discharge Instructions    Call MD for:   Complete by: As directed    Worsening contractions or pain; leakage of fluid; bleeding.   Diet general   Complete by: As directed    Increase activity slowly   Complete by: As directed      Allergies as of 11/09/2019      Reactions   Macrobid [nitrofurantoin Macrocrystal] Other (See Comments)   Can not move her body, loses all control of arms legs    Augmentin [amoxicillin-pot Clavulanate] Hives, Swelling   Cephalexin Other (See Comments)   Achy in bones   Penicillins Hives, Swelling   Did it involve swelling of the face/tongue/throat, SOB, or low BP? No Did it involve sudden or severe rash/hives, skin peeling, or any reaction on the inside of your mouth or nose? No Did you need to seek medical attention at a hospital or doctor's office? No When did it last happen?childhood allergy If all above answers are  "NO", may proceed with cephalosporin use.      Medication List    TAKE these medications   acetaminophen 325 MG tablet Commonly known as: TYLENOL Take 650 mg by mouth every 6 (six) hours as needed for moderate pain.   cyclobenzaprine 10 MG tablet Commonly known as: FLEXERIL Take 1 tablet (10 mg total) by mouth 3 (three) times daily as needed for muscle spasms.   multivitamin-prenatal 27-0.8 MG Tabs tablet Take 1 tablet by mouth daily at 12 noon.      Follow-up Information    Nadara Mustard, MD Follow up.   Specialty: Obstetrics and Gynecology Why: as scheduled Contact information: [address] [place] [address] [phone]           Total time spent taking care of this patient: 15 minutes  Signed: Letitia Libra 11/09/2019, 1:42 PM

## 2019-11-09 NOTE — Discharge Summary (Signed)
  See FPN 

## 2019-11-09 NOTE — Discharge Instructions (Signed)
Braxton Hicks Contractions Contractions of the uterus can occur throughout pregnancy, but they are not always a sign that you are in labor. You may have practice contractions called Braxton Hicks contractions. These false labor contractions are sometimes confused with true labor. What are Braxton Hicks contractions? Braxton Hicks contractions are tightening movements that occur in the muscles of the uterus before labor. Unlike true labor contractions, these contractions do not result in opening (dilation) and thinning of the cervix. Toward the end of pregnancy (32-34 weeks), Braxton Hicks contractions can happen more often and may become stronger. These contractions are sometimes difficult to tell apart from true labor because they can be very uncomfortable. You should not feel embarrassed if you go to the hospital with false labor. Sometimes, the only way to tell if you are in true labor is for your health care provider to look for changes in the cervix. The health care provider will do a physical exam and may monitor your contractions. If you are not in true labor, the exam should show that your cervix is not dilating and your water has not broken. If there are no other health problems associated with your pregnancy, it is completely safe for you to be sent home with false labor. You may continue to have Braxton Hicks contractions until you go into true labor. How to tell the difference between true labor and false labor True labor  Contractions last 30-70 seconds.  Contractions become very regular.  Discomfort is usually felt in the top of the uterus, and it spreads to the lower abdomen and low back.  Contractions do not go away with walking.  Contractions usually become more intense and increase in frequency.  The cervix dilates and gets thinner. False labor  Contractions are usually shorter and not as strong as true labor contractions.  Contractions are usually irregular.  Contractions  are often felt in the front of the lower abdomen and in the groin.  Contractions may go away when you walk around or change positions while lying down.  Contractions get weaker and are shorter-lasting as time goes on.  The cervix usually does not dilate or become thin. Follow these instructions at home:   Take over-the-counter and prescription medicines only as told by your health care provider.  Keep up with your usual exercises and follow other instructions from your health care provider.  Eat and drink lightly if you think you are going into labor.  If Braxton Hicks contractions are making you uncomfortable: ? Change your position from lying down or resting to walking, or change from walking to resting. ? Sit and rest in a tub of warm water. ? Drink enough fluid to keep your urine pale yellow. Dehydration may cause these contractions. ? Do slow and deep breathing several times an hour.  Keep all follow-up prenatal visits as told by your health care provider. This is important. Contact a health care provider if:  You have a fever.  You have continuous pain in your abdomen. Get help right away if:  Your contractions become stronger, more regular, and closer together.  You have fluid leaking or gushing from your vagina.  You pass blood-tinged mucus (bloody show).  You have bleeding from your vagina.  You have low back pain that you never had before.  You feel your baby's head pushing down and causing pelvic pressure.  Your baby is not moving inside you as much as it used to. Summary  Contractions that occur before labor are   called Braxton Hicks contractions, false labor, or practice contractions.  Braxton Hicks contractions are usually shorter, weaker, farther apart, and less regular than true labor contractions. True labor contractions usually become progressively stronger and regular, and they become more frequent.  Manage discomfort from Braxton Hicks contractions  by changing position, resting in a warm bath, drinking plenty of water, or practicing deep breathing. This information is not intended to replace advice given to you by your health care provider. Make sure you discuss any questions you have with your health care provider. Document Released: 04/24/2017 Document Revised: 11/21/2017 Document Reviewed: 04/24/2017 Elsevier Patient Education  2020 [employer]  

## 2019-11-12 ENCOUNTER — Telehealth: Payer: Self-pay

## 2019-11-12 NOTE — Telephone Encounter (Signed)
Note created for court stating c-section date. Pt aware. She should receive in my chart. Pt needs [place] to print & she will p/u on Monday. Letter printed and left up front.

## 2019-11-12 NOTE — Telephone Encounter (Signed)
Patient has a court date within 2 weeks of delivery. Courts advised her she would need a note stating she is going to have a c-section in order to get court date continued. OE#[phone]

## 2019-11-15 ENCOUNTER — Ambulatory Visit (INDEPENDENT_AMBULATORY_CARE_PROVIDER_SITE_OTHER): Payer: Medicaid Other | Admitting: Obstetrics & Gynecology

## 2019-11-15 ENCOUNTER — Encounter
Admission: RE | Admit: 2019-11-15 | Discharge: 2019-11-15 | Disposition: A | Discharge status: Home / Self Care | Payer: Medicaid Other | Source: Ambulatory Visit | Attending: Obstetrics & Gynecology | Admitting: Obstetrics & Gynecology

## 2019-11-15 ENCOUNTER — Other Ambulatory Visit: Payer: Self-pay

## 2019-11-15 ENCOUNTER — Encounter: Payer: Self-pay | Admitting: Obstetrics & Gynecology

## 2019-11-15 VITALS — BP 100/60 | Temp 96.9°F | Ht 61.0 in | Wt 203.0 lb

## 2019-11-15 DIAGNOSIS — O34219 Maternal care for unspecified type scar from previous cesarean delivery: Secondary | ICD-10-CM

## 2019-11-15 DIAGNOSIS — O0990 Supervision of high risk pregnancy, unspecified, unspecified trimester: Secondary | ICD-10-CM

## 2019-11-15 DIAGNOSIS — Z3A38 38 weeks gestation of pregnancy: Secondary | ICD-10-CM

## 2019-11-15 DIAGNOSIS — Z98891 History of uterine scar from previous surgery: Secondary | ICD-10-CM

## 2019-11-15 DIAGNOSIS — O0993 Supervision of high risk pregnancy, unspecified, third trimester: Secondary | ICD-10-CM

## 2019-11-15 NOTE — Patient Instructions (Addendum)
PRE ADMISSION TESTING For Covid, prior to procedure Tuesday DEC 1,  9:00-10:00 [facility] entrance (drive up)  Results in 42-35 hours You will not receive notification if test results are negative. If positive for Covid19, your provider will notify you by phone, with additional instructions.   Cesarean Delivery, Care After This sheet gives you information about how to care for yourself after your procedure. Your health care provider may also give you more specific instructions. If you have problems or questions, contact your health care provider. What can I expect after the procedure? After the procedure, it is common to have:  A small amount of blood or clear fluid coming from the incision.  Some redness, swelling, and pain in your incision area.  Some abdominal pain and soreness.  Vaginal bleeding (lochia). Even though you did not have a vaginal delivery, you will still have vaginal bleeding and discharge.  Pelvic cramps.  Fatigue. You may have pain, swelling, and discomfort in the tissue between your vagina and your anus (perineum) if:  Your C-section was unplanned, and you were allowed to labor and push.  An incision was made in the area (episiotomy) or the tissue tore during attempted vaginal delivery. Follow these instructions at home: Incision care   Follow instructions from your health care provider about how to take care of your incision. Make sure you: ? Wash your hands with soap and water before you change your bandage (dressing). If soap and water are not available, use hand sanitizer. ? If you have a dressing, change it or remove it as told by your health care provider. ? Leave stitches (sutures), skin staples, skin glue, or adhesive strips in place. These skin closures may need to stay in place for 2 weeks or longer. If adhesive strip edges start to loosen and curl up, you may trim the loose edges. Do not remove adhesive strips completely unless your  health care provider tells you to do that.  Check your incision area every day for signs of infection. Check for: ? More redness, swelling, or pain. ? More fluid or blood. ? Warmth. ? Pus or a bad smell.  Do not take baths, swim, or use a hot tub until your health care provider says it's okay. Ask your health care provider if you can take showers.  When you cough or sneeze, hug a pillow. This helps with pain and decreases the chance of your incision opening up (dehiscing). Do this until your incision heals. Medicines  Take over-the-counter and prescription medicines only as told by your health care provider.  If you were prescribed an antibiotic medicine, take it as told by your health care provider. Do not stop taking the antibiotic even if you start to feel better.  Do not drive or use heavy machinery while taking prescription pain medicine. Lifestyle  Do not drink alcohol. This is especially important if you are breastfeeding or taking pain medicine.  Do not use any products that contain nicotine or tobacco, such as cigarettes, e-cigarettes, and chewing tobacco. If you need help quitting, ask your health care provider. Eating and drinking  Drink at least 8 eight-ounce glasses of water every day unless told not to by your health care provider. If you breastfeed, you may need to drink even more water.  Eat high-fiber foods every day. These foods may help prevent or relieve constipation. High-fiber foods include: ? Whole grain cereals and breads. ? Brown rice. ? Beans. ? Fresh fruits and vegetables. Activity  If possible, have someone help you care for your baby and help with household activities for at least a few days after you leave the hospital.  Return to your normal activities as told by your health care provider. Ask your health care provider what activities are safe for you.  Rest as much as possible. Try to rest or take a nap while your baby is sleeping.  Do not  lift anything that is heavier than 10 lbs (4.5 kg), or the limit that you were told, until your health care provider says that it is safe.  Talk with your health care provider about when you can engage in sexual activity. This may depend on your: ? Risk of infection. ? How fast you heal. ? Comfort and desire to engage in sexual activity. General instructions  Do not use tampons or douches until your health care provider approves.  Wear loose, comfortable clothing and a supportive and well-fitting bra.  Keep your perineum clean and dry. Wipe from front to back when you use the toilet.  If you pass a blood clot, save it and call your health care provider to discuss. Do not flush blood clots down the toilet before you get instructions from your health care provider.  Keep all follow-up visits for you and your baby as told by your health care provider. This is important. Contact a health care provider if:  You have: ? A fever. ? Bad-smelling vaginal discharge. ? Pus or a bad smell coming from your incision. ? Difficulty or pain when urinating. ? A sudden increase or decrease in the frequency of your bowel movements. ? More redness, swelling, or pain around your incision. ? More fluid or blood coming from your incision. ? A rash. ? Nausea. ? Little or no interest in activities you used to enjoy. ? Questions about caring for yourself or your baby.  Your incision feels warm to the touch.  Your breasts turn red or become painful or hard.  You feel unusually sad or worried.  You vomit.  You pass a blood clot from your vagina.  You urinate more than usual.  You are dizzy or light-headed. Get help right away if:  You have: ? Pain that does not go away or get better with medicine. ? Chest pain. ? Difficulty breathing. ? Blurred vision or spots in your vision. ? Thoughts about hurting yourself or your baby. ? New pain in your abdomen or in one of your legs. ? A severe  headache.  You faint.  You bleed from your vagina so much that you fill more than one sanitary pad in one hour. Bleeding should not be heavier than your heaviest period. Summary  After the procedure, it is common to have pain at your incision site, abdominal cramping, and slight bleeding from your vagina.  Check your incision area every day for signs of infection.  Tell your health care provider about any unusual symptoms.  Keep all follow-up visits for you and your baby as told by your health care provider. This information is not intended to replace advice given to you by your health care provider. Make sure you discuss any questions you have with your health care provider. Document Released: 08/31/2002 Document Revised: 06/17/2018 Document Reviewed: 06/17/2018 Elsevier Patient Education  2020 [employer].

## 2019-11-15 NOTE — Progress Notes (Signed)
PRE-OPERATIVE HISTORY AND PHYSICAL EXAM  HPI:  Carla Ingram is a 23 y.o. [id number].  Patient's last menstrual period was 02/20/2019.  [redacted]w[redacted]d Estimated Date of Delivery: 11/25/19  She is being admitted for Elective repeat  PMHx: She  has a past medical history of Anemia, GERD (gastroesophageal reflux disease), Migraine, Placental abruption, Positive pregnancy test, Psoriasis, and TMJ (dislocation of temporomandibular joint). Also,  has a past surgical history that includes Dilation and curettage of uterus (2009); Cesarean section (2012, 2016); and Cesarean section (N/A, 07/09/2016)., family history includes CVA in her mother; Stroke in her mother.,  reports that she has been smoking cigarettes. She started smoking about 15 years ago. She has a 5.50 pack-year smoking history. She has never used smokeless tobacco. She reports previous alcohol use of about 8.0 standard drinks of alcohol per week. She reports that she does not use drugs. OB History  Gravida Para Term Preterm AB Living  4 3 2 1   3   SAB TAB Ectopic Multiple Live Births        0 3    # Outcome Date GA Lbr Len/2nd Weight Sex Delivery Anes PTL Lv  4 Current           3 Term 07/09/16 [redacted]w[redacted]d  7 lb 5.5 oz (3.33 kg) F CS-LTranv Spinal  LIV  2 Term 01/19/15 [redacted]w[redacted]d  6 lb 10 oz (3.005 kg) F CS-LTranv Spinal  LIV  1 Preterm 02/02/11 [redacted]w[redacted]d  5 lb 5 oz (2.41 kg) F CS-LTranv Gen  LIV     Complications: Abruptio Placenta  Patient denies any other pertinent gynecologic issues. See prenatal record for more complete H&P   Current Outpatient Medications:  .  acetaminophen (TYLENOL) 325 MG tablet, Take 650 mg by mouth every 6 (six) hours as needed for moderate pain., Disp: , Rfl:  .  cyclobenzaprine (FLEXERIL) 10 MG tablet, Take 1 tablet (10 mg total) by mouth 3 (three) times daily as needed for muscle spasms. (Patient not taking: Reported on 11/04/2019), Disp: 30 tablet, Rfl: 0 .  Prenatal Vit-Fe Fumarate-FA (MULTIVITAMIN-PRENATAL) 27-0.8 MG TABS  tablet, Take 1 tablet by mouth daily at 12 noon., Disp: , Rfl:  Also, is allergic to macrobid [nitrofurantoin macrocrystal]; augmentin [amoxicillin-pot clavulanate]; cephalexin; and penicillins.  Review of Systems  Constitutional: Negative for chills, fever and malaise/fatigue.  HENT: Negative for congestion, sinus pain and sore throat.   Eyes: Negative for blurred vision and pain.  Respiratory: Negative for cough and wheezing.   Cardiovascular: Negative for chest pain and leg swelling.  Gastrointestinal: Negative for abdominal pain, constipation, diarrhea, heartburn, nausea and vomiting.  Genitourinary: Negative for dysuria, frequency, hematuria and urgency.  Musculoskeletal: Negative for back pain, joint pain, myalgias and neck pain.  Skin: Negative for itching and rash.  Neurological: Negative for dizziness, tremors and weakness.  Endo/Heme/Allergies: Does not bruise/bleed easily.  Psychiatric/Behavioral: Negative for depression. The patient is not nervous/anxious and does not have insomnia.     Objective: BP 100/60   Temp (!) 96.9 F (36.1 C)   Ht 5\' 1"  (1.549 m)   Wt 203 lb (92.1 kg)   LMP 02/20/2019   BMI 38.36 kg/m  Filed Weights   11/15/19 0914  Weight: 203 lb (92.1 kg)   Physical Exam Constitutional:      General: She is not in acute distress.    Appearance: She is well-developed.  HENT:     Head: Normocephalic and atraumatic. No laceration.     Right  Ear: Hearing normal.     Left Ear: Hearing normal.     Mouth/Throat:     Pharynx: Uvula midline.  Eyes:     Pupils: Pupils are equal, round, and reactive to light.  Neck:     Musculoskeletal: Normal range of motion and neck supple.     Thyroid: No thyromegaly.  Cardiovascular:     Rate and Rhythm: Normal rate and regular rhythm.     Heart sounds: No murmur. No friction rub. No gallop.   Pulmonary:     Effort: Pulmonary effort is normal. No respiratory distress.     Breath sounds: Normal breath sounds. No  wheezing.  Chest:     Breasts:        Right: No mass, skin change or tenderness.        Left: No mass, skin change or tenderness.  Abdominal:     General: Bowel sounds are normal. There is no distension.     Palpations: Abdomen is soft.     Tenderness: There is no abdominal tenderness. There is no rebound.  Musculoskeletal: Normal range of motion.  Neurological:     Mental Status: She is alert and oriented to person, place, and time.     Cranial Nerves: No cranial nerve deficit.  Skin:    General: Skin is warm and dry.  Psychiatric:        Judgment: Judgment normal.  Vitals signs reviewed.   FHT 140s  Assessment: 1. [redacted] weeks gestation of pregnancy   2. Supervision of high risk pregnancy, antepartum   3. History of 3 cesarean sections     PLAN: 1.  Cesarean Delivery as Scheduled. Depo planned for birth control  Patient will undergo surgical management with Cesarean Section.   The risks of surgery were discussed in detail with the patient including but not limited to: bleeding which may require transfusion or reoperation; infection which may require antibiotics; injury to surrounding organs which may involve bowel, bladder, ureters ; need for additional procedures including laparoscopy or laparotomy; thromboembolic phenomenon, surgical site problems and other postoperative/anesthesia complications. Likelihood of success in alleviating the patient's condition was discussed. Routine postoperative instructions will be reviewed with the patient and her family in detail after surgery.  The patient concurred with the proposed plan, giving informed written consent for the surgery.  Patient will be NPO procedure.  Preoperative prophylactic antibiotics, as necessary, and SCDs ordered on call to the OR.  [facility]inic [facility] Prenatal Labs  Dating 11 week [place] Blood type: O/Positive/-- (05/18 1348)   Genetic Screen NIPS: diploid XY Antibody:Negative (05/18 1348)  Anatomic [place] Normal anatomy, anterior  placenta Rubella: 1.96 (05/18 1348) Varicella:  Immune  GTT Early:     67           Third trimester: 124 RPR: Non Reactive (05/18 1348)   Rhogam  not needed HBsAg: Negative (05/18 1348)   TDaP vaccine  9/22      Flu Shot:declined HIV: Non Reactive (05/18 1348)   Baby Food        Breast                        GBS: NEG  Contraception DEPO Pap: 09/03/2016, NILM  CBB  No   CS/VBAC CS [facility])   Support Person Vara Guardian, M.D. 11/15/2019 9:15 AM

## 2019-11-15 NOTE — Patient Instructions (Addendum)
Your procedure is scheduled on: 11-25-19 THURSDAY Report to [facility] AT 5:15 AM-CALL [phone] UPON ARRIVAL AND SOMEONE FROM LABOR AND DELIVERY OR SECURITY WILL COME AND ESCORT YOU UP TO LABOR AND DELIVERY ON THE 3RD FLOOR   Remember: Instructions that are not followed completely may result in serious medical risk, up to and including death, or upon the discretion of your surgeon and anesthesiologist your surgery may need to be rescheduled.    _x___ 1. Do not eat food after midnight the night before your procedure. NO GUM OR CANDY AFTER MIDNIGHT. You may drink clear liquids up to 2 hours before you are scheduled to arrive at the hospital for your procedure.  Do not drink clear liquids within 2 hours of your scheduled arrival to the hospital.  Clear liquids include  --Water or Apple juice without pulp  --Gatorade  --Black Coffee or Clear Tea (No milk, no creamers, do not add anything to  the coffee or Tea   ____Ensure clear carbohydrate drink on the way to the hospital for bariatric patients  ____Ensure clear carbohydrate drink 3 hours before surgery.     __x__ 2. No Alcohol for 24 hours before or after surgery.   __x__3. No Smoking or e-cigarettes for 24 prior to surgery.  Do not use any chewable tobacco products for at least 6 hour prior to surgery   ____  4. Bring all medications with you on the day of surgery if instructed.    __x__ 5. Notify your doctor if there is any change in your medical condition     (cold, fever, infections).    x___6. On the morning of surgery brush your teeth with toothpaste and water.  You may rinse your mouth with mouth wash if you wish.  Do not swallow any toothpaste or mouthwash.   Do not wear jewelry, make-up, hairpins, clips or nail polish.  Do not wear lotions, powders, or perfumes. You may wear deodorant.  Do not shave 48 hours prior to surgery. Men may shave face and neck.  Do not bring valuables to the hospital.    [facility] is  not responsible for any belongings or valuables.               Contacts, dentures or bridgework may not be worn into surgery.  Leave your suitcase in the car. After surgery it may be brought to your room.  For patients admitted to the hospital, discharge time is determined by your treatment team.  _  Patients discharged the day of surgery will not be allowed to drive home.  You will need someone to drive you home and stay with you the night of your procedure.    Please read over the following fact sheets that you were given:   [facility] Preparing for Surgery   ____ Take anti-hypertensive listed below, cardiac, seizure, asthma, anti-reflux and psychiatric medicines. These include:  1. NONE  2.  3.  4.  5.  6.  ____Fleets enema or Magnesium Citrate as directed.   _x___ Use CHG Soap or sage wipes as directed on instruction sheet-AVOID NIPPLE AND PRIVATE AREA-USE REGULAR SOAP FOR THESE AREAS  ____ Use inhalers on the day of surgery and bring to hospital day of surgery  ____ Stop Metformin and Janumet 2 days prior to surgery.    ____ Take 1/2 of usual insulin dose the night before surgery and none on the morning surgery.   ____ Follow recommendations from Cardiologist, Pulmonologist or PCP regarding  stopping Aspirin, Coumadin, Plavix ,Eliquis, Effient, or Pradaxa, and Pletal.  X____Stop Anti-inflammatories such as Advil, Aleve, Ibuprofen, Motrin, Naproxen, Naprosyn, Goodies powders or aspirin products 7 DAYS PRIOR-OK to take Tylenol    ____ Stop supplements until after surgery.    ____ Bring C-Pap to the hospital.

## 2019-11-19 ENCOUNTER — Inpatient Hospital Stay: Admission: RE | Admit: 2019-11-19 | Payer: Medicaid Other | Source: Ambulatory Visit

## 2019-11-22 ENCOUNTER — Other Ambulatory Visit: Payer: Self-pay

## 2019-11-22 ENCOUNTER — Encounter: Payer: Self-pay | Admitting: Obstetrics and Gynecology

## 2019-11-22 ENCOUNTER — Ambulatory Visit (INDEPENDENT_AMBULATORY_CARE_PROVIDER_SITE_OTHER): Payer: Medicaid Other | Admitting: Obstetrics and Gynecology

## 2019-11-22 VITALS — BP 112/60 | Wt 204.0 lb

## 2019-11-22 DIAGNOSIS — Z98891 History of uterine scar from previous surgery: Secondary | ICD-10-CM

## 2019-11-22 DIAGNOSIS — O0993 Supervision of high risk pregnancy, unspecified, third trimester: Secondary | ICD-10-CM

## 2019-11-22 DIAGNOSIS — O34219 Maternal care for unspecified type scar from previous cesarean delivery: Secondary | ICD-10-CM

## 2019-11-22 DIAGNOSIS — Z3A39 39 weeks gestation of pregnancy: Secondary | ICD-10-CM

## 2019-11-22 DIAGNOSIS — O0990 Supervision of high risk pregnancy, unspecified, unspecified trimester: Secondary | ICD-10-CM

## 2019-11-22 NOTE — Progress Notes (Signed)
ROB C/o pelvic pressure, braxton hicks, contractions in lower back  Denies lof, no vb, Good FM

## 2019-11-22 NOTE — Progress Notes (Signed)
    Routine Prenatal Care Visit  Subjective  Carla Ingram is a 23 y.o. [phone] at [redacted]w[redacted]d being seen today for ongoing prenatal care.  She is currently monitored for the following issues for this high-risk pregnancy and has Temporomandibular joint disorders; B12 deficiency; Tobacco use disorder; Other psoriasis; History of 3 cesarean sections; Supervision of high risk pregnancy, antepartum; Back pain affecting pregnancy, antepartum; and Low back pain during pregnancy, antepartum on their problem list.  ----------------------------------------------------------------------------------- Patient reports backache and braxton hicks contractions.   Contractions: Irregular. Vag. Bleeding: None.  Movement: Present. Denies leaking of fluid.  ----------------------------------------------------------------------------------- The following portions of the patient's history were reviewed and updated as appropriate: allergies, current medications, past family history, past medical history, past social history, past surgical history and problem list. Problem list updated.   Objective  Blood pressure 112/60, weight 204 lb (92.5 kg), last menstrual period 02/20/2019, unknown if currently breastfeeding. Pregravid weight 159 lb (72.1 kg) Total Weight Gain 45 lb (20.4 kg) Urinalysis:      Fetal Status: Fetal Heart Rate (bpm): 134 Fundal Height: 48 cm Movement: Present     General:  Alert, oriented and cooperative. Patient is in no acute distress.  Skin: Skin is warm and dry. No rash noted.   Cardiovascular: Normal heart rate noted  Respiratory: Normal respiratory effort, no problems with respiration noted  Abdomen: Soft, gravid, appropriate for gestational age. Pain/Pressure: Present     Pelvic:  Cervical exam deferred        Extremities: Normal range of motion.  Edema: None  Mental Status: Normal mood and affect. Normal behavior. Normal judgment and thought content.     Assessment   23 y.o. [id number]  at [redacted]w[redacted]d by  11/25/2019, by Ultrasound presenting for routine prenatal visit  Plan   pregnancy 4  Problems (from 02/20/19 to present)    Problem Noted Resolved   Supervision of high risk pregnancy, antepartum 03/28/2019 by [name], CNM No   Overview Addendum 10/29/2019 10:34 AM by [name], MD    [facility] Prenatal Labs  Dating 11 week [place] Blood type: O/Positive/-- (05/18 1348)   Genetic Screen NIPS: diploid XY Antibody:Negative (05/18 1348)  Anatomic [place] Normal anatomy, anterior placenta Rubella: 1.96 (05/18 1348) Varicella:  Immune  GTT Early:     67           Third trimester: 124 RPR: Non Reactive (05/18 1348)   Rhogam  not needed HBsAg: Negative (05/18 1348)   TDaP vaccine  9/22      Flu Shot:declined HIV: Non Reactive (05/18 1348)   Baby Food                                GBS:   Contraception DEPO Pap: 09/03/2016, NILM  CBB  No   CS/VBAC CS (PH)   Support Person                  Gestational age appropriate obstetric precautions including but not limited to vaginal bleeding, contractions, leaking of fluid and fetal movement were reviewed in detail with the patient.    Has cesarean section scheduled for Thursday.   Return in about 1 week (around 11/29/2019), or if symptoms worsen or fail to improve.  Homero Fellers MD [facility] 11/22/2019, 10:48 AM

## 2019-11-23 ENCOUNTER — Other Ambulatory Visit: Payer: Self-pay | Admitting: Obstetrics & Gynecology

## 2019-11-23 ENCOUNTER — Other Ambulatory Visit
Admission: RE | Admit: 2019-11-23 | Discharge: 2019-11-23 | Disposition: A | Discharge status: Home / Self Care | Payer: Medicaid Other | Source: Ambulatory Visit | Attending: Obstetrics & Gynecology | Admitting: Obstetrics & Gynecology

## 2019-11-23 DIAGNOSIS — Z20828 Contact with and (suspected) exposure to other viral communicable diseases: Secondary | ICD-10-CM | POA: Diagnosis not present

## 2019-11-23 DIAGNOSIS — Z01812 Encounter for preprocedural laboratory examination: Secondary | ICD-10-CM | POA: Diagnosis not present

## 2019-11-23 LAB — TYPE AND SCREEN
ABO/RH(D): O POS
Antibody Screen: NEGATIVE
Extend sample reason: UNDETERMINED

## 2019-11-23 LAB — CBC
HCT: 36.4 % (ref 36.0–46.0)
Hemoglobin: 12.5 g/dL (ref 12.0–15.0)
MCH: 33 pg (ref 26.0–34.0)
MCHC: 34.3 g/dL (ref 30.0–36.0)
MCV: 96 fL (ref 80.0–100.0)
Platelets: 341 10*3/uL (ref 150–400)
RBC: 3.79 MIL/uL — ABNORMAL LOW (ref 3.87–5.11)
RDW: 13 % (ref 11.5–15.5)
WBC: 12.1 10*3/uL — ABNORMAL HIGH (ref 4.0–10.5)
nRBC: 0 % (ref 0.0–0.2)

## 2019-11-23 LAB — SARS CORONAVIRUS 2 (TAT 6-24 HRS): SARS Coronavirus 2: NEGATIVE

## 2019-11-25 ENCOUNTER — Encounter: Payer: Self-pay | Admitting: *Deleted

## 2019-11-25 ENCOUNTER — Inpatient Hospital Stay: Payer: Medicaid Other | Admitting: Anesthesiology

## 2019-11-25 ENCOUNTER — Other Ambulatory Visit: Payer: Self-pay

## 2019-11-25 ENCOUNTER — Inpatient Hospital Stay
Admission: RE | Admit: 2019-11-25 | Discharge: 2019-11-26 | DRG: 787 | Disposition: A | Discharge status: Home / Self Care | Payer: Medicaid Other | Attending: Obstetrics & Gynecology | Admitting: Obstetrics & Gynecology

## 2019-11-25 ENCOUNTER — Encounter
Admission: RE | Disposition: A | Discharge status: Home / Self Care | Payer: Self-pay | Source: Home / Self Care | Attending: Obstetrics & Gynecology

## 2019-11-25 DIAGNOSIS — O9081 Anemia of the puerperium: Secondary | ICD-10-CM | POA: Diagnosis not present

## 2019-11-25 DIAGNOSIS — O34211 Maternal care for low transverse scar from previous cesarean delivery: Principal | ICD-10-CM | POA: Diagnosis present

## 2019-11-25 DIAGNOSIS — O3483 Maternal care for other abnormalities of pelvic organs, third trimester: Secondary | ICD-10-CM | POA: Diagnosis present

## 2019-11-25 DIAGNOSIS — D62 Acute posthemorrhagic anemia: Secondary | ICD-10-CM | POA: Diagnosis not present

## 2019-11-25 DIAGNOSIS — Z3A40 40 weeks gestation of pregnancy: Secondary | ICD-10-CM | POA: Diagnosis not present

## 2019-11-25 DIAGNOSIS — O0990 Supervision of high risk pregnancy, unspecified, unspecified trimester: Secondary | ICD-10-CM

## 2019-11-25 DIAGNOSIS — Z98891 History of uterine scar from previous surgery: Secondary | ICD-10-CM

## 2019-11-25 DIAGNOSIS — O480 Post-term pregnancy: Secondary | ICD-10-CM | POA: Diagnosis not present

## 2019-11-25 DIAGNOSIS — N83201 Unspecified ovarian cyst, right side: Secondary | ICD-10-CM | POA: Diagnosis present

## 2019-11-25 DIAGNOSIS — O34219 Maternal care for unspecified type scar from previous cesarean delivery: Secondary | ICD-10-CM | POA: Diagnosis not present

## 2019-11-25 SURGERY — Surgical Case
Anesthesia: Spinal

## 2019-11-25 MED ORDER — BUPIVACAINE ON-Q PAIN PUMP (FOR ORDER SET NO CHG)
INJECTION | Status: DC
  Filled 2019-11-25: qty 1

## 2019-11-25 MED ORDER — NALOXONE HCL 0.4 MG/ML IJ SOLN: 0.4000 mg | INTRAMUSCULAR | Status: DC | PRN

## 2019-11-25 MED ORDER — SODIUM CHLORIDE 0.9 % IV SOLN
INTRAVENOUS | Status: DC | PRN
  Administered 2019-11-25: 25 ug/min via INTRAVENOUS

## 2019-11-25 MED ORDER — EPHEDRINE SULFATE 50 MG/ML IJ SOLN
INTRAMUSCULAR | Status: AC
  Filled 2019-11-25: qty 1

## 2019-11-25 MED ORDER — PRENATAL MULTIVITAMIN CH
1.0000 | ORAL_TABLET | Freq: Every day | ORAL | Status: DC
  Administered 2019-11-26: 1 via ORAL
  Filled 2019-11-25: qty 1

## 2019-11-25 MED ORDER — CLINDAMYCIN PHOSPHATE 900 MG/50ML IV SOLN
900.0000 mg | INTRAVENOUS | Status: AC
  Administered 2019-11-25: 900 mg via INTRAVENOUS
  Filled 2019-11-25: qty 50

## 2019-11-25 MED ORDER — BUPIVACAINE IN DEXTROSE 0.75-8.25 % IT SOLN
INTRATHECAL | Status: DC | PRN
  Administered 2019-11-25: 1.4 mL via INTRATHECAL

## 2019-11-25 MED ORDER — PHENYLEPHRINE HCL (PRESSORS) 10 MG/ML IV SOLN
INTRAVENOUS | Status: DC | PRN
  Administered 2019-11-25: 100 ug via INTRAVENOUS
  Administered 2019-11-25: 200 ug via INTRAVENOUS
  Administered 2019-11-25: 100 ug via INTRAVENOUS
  Administered 2019-11-25: 200 ug via INTRAVENOUS
  Administered 2019-11-25 (×2): 100 ug via INTRAVENOUS

## 2019-11-25 MED ORDER — KETOROLAC TROMETHAMINE 30 MG/ML IJ SOLN
30.0000 mg | Freq: Four times a day (QID) | INTRAMUSCULAR | Status: AC
  Administered 2019-11-25: 30 mg via INTRAVENOUS
  Filled 2019-11-25 (×3): qty 1

## 2019-11-25 MED ORDER — OXYTOCIN 40 UNITS IN NORMAL SALINE INFUSION - SIMPLE MED
INTRAVENOUS | Status: AC
  Filled 2019-11-25: qty 1000

## 2019-11-25 MED ORDER — BUPIVACAINE HCL (PF) 0.5 % IJ SOLN
INTRAMUSCULAR | Status: DC | PRN
  Administered 2019-11-25: 10 mL

## 2019-11-25 MED ORDER — OXYCODONE-ACETAMINOPHEN 5-325 MG PO TABS: 1.0000 | ORAL_TABLET | ORAL | Status: DC | PRN

## 2019-11-25 MED ORDER — OXYTOCIN 40 UNITS IN NORMAL SALINE INFUSION - SIMPLE MED
INTRAVENOUS | Status: DC | PRN
  Administered 2019-11-25: 09:00:00
  Administered 2019-11-25: 700 mL via INTRAVENOUS

## 2019-11-25 MED ORDER — NALBUPHINE HCL 10 MG/ML IJ SOLN: 5.0000 mg | Freq: Once | INTRAMUSCULAR | Status: DC | PRN

## 2019-11-25 MED ORDER — BUPIVACAINE HCL 0.5 % IJ SOLN
10.0000 mL | Freq: Once | INTRAMUSCULAR | Status: DC
  Filled 2019-11-25: qty 10

## 2019-11-25 MED ORDER — DIPHENHYDRAMINE HCL 25 MG PO CAPS: 25.0000 mg | ORAL_CAPSULE | ORAL | Status: DC | PRN

## 2019-11-25 MED ORDER — ACETAMINOPHEN 325 MG PO TABS: 650.0000 mg | ORAL_TABLET | ORAL | Status: DC | PRN

## 2019-11-25 MED ORDER — MORPHINE SULFATE (PF) 0.5 MG/ML IJ SOLN
INTRAMUSCULAR | Status: AC
  Filled 2019-11-25: qty 10

## 2019-11-25 MED ORDER — LACTATED RINGERS IV SOLN: INTRAVENOUS | Status: DC

## 2019-11-25 MED ORDER — MORPHINE SULFATE (PF) 2 MG/ML IV SOLN: 1.0000 mg | INTRAVENOUS | Status: DC | PRN

## 2019-11-25 MED ORDER — SODIUM CHLORIDE 0.9% FLUSH: 3.0000 mL | INTRAVENOUS | Status: DC | PRN

## 2019-11-25 MED ORDER — SIMETHICONE 80 MG PO CHEW
80.0000 mg | CHEWABLE_TABLET | ORAL | Status: DC
  Administered 2019-11-26: 80 mg via ORAL
  Filled 2019-11-25: qty 1

## 2019-11-25 MED ORDER — DIBUCAINE (PERIANAL) 1 % EX OINT: 1.0000 "application " | TOPICAL_OINTMENT | CUTANEOUS | Status: DC | PRN

## 2019-11-25 MED ORDER — PROPOFOL 10 MG/ML IV BOLUS
INTRAVENOUS | Status: AC
  Filled 2019-11-25: qty 20

## 2019-11-25 MED ORDER — ROCURONIUM BROMIDE 50 MG/5ML IV SOLN
INTRAVENOUS | Status: AC
  Filled 2019-11-25: qty 1

## 2019-11-25 MED ORDER — ZOLPIDEM TARTRATE 5 MG PO TABS: 5.0000 mg | ORAL_TABLET | Freq: Every evening | ORAL | Status: DC | PRN

## 2019-11-25 MED ORDER — DIPHENHYDRAMINE HCL 50 MG/ML IJ SOLN: 12.5000 mg | INTRAMUSCULAR | Status: DC | PRN

## 2019-11-25 MED ORDER — KETOROLAC TROMETHAMINE 30 MG/ML IJ SOLN: 30.0000 mg | Freq: Four times a day (QID) | INTRAMUSCULAR | Status: AC | PRN

## 2019-11-25 MED ORDER — SIMETHICONE 80 MG PO CHEW: 80.0000 mg | CHEWABLE_TABLET | ORAL | Status: DC | PRN

## 2019-11-25 MED ORDER — KETOROLAC TROMETHAMINE 30 MG/ML IJ SOLN
30.0000 mg | Freq: Four times a day (QID) | INTRAMUSCULAR | Status: AC | PRN
  Administered 2019-11-25: 30 mg via INTRAVENOUS

## 2019-11-25 MED ORDER — NALBUPHINE HCL 10 MG/ML IJ SOLN: 5.0000 mg | INTRAMUSCULAR | Status: DC | PRN

## 2019-11-25 MED ORDER — SENNOSIDES-DOCUSATE SODIUM 8.6-50 MG PO TABS
2.0000 | ORAL_TABLET | ORAL | Status: DC
  Administered 2019-11-26: 2 via ORAL
  Filled 2019-11-25: qty 2

## 2019-11-25 MED ORDER — KETOROLAC TROMETHAMINE 30 MG/ML IJ SOLN
INTRAMUSCULAR | Status: AC
  Filled 2019-11-25: qty 1

## 2019-11-25 MED ORDER — KETOROLAC TROMETHAMINE 30 MG/ML IJ SOLN: 30.0000 mg | Freq: Once | INTRAMUSCULAR | Status: DC

## 2019-11-25 MED ORDER — GENTAMICIN SULFATE 40 MG/ML IJ SOLN
5.0000 mg/kg | INTRAVENOUS | Status: AC
  Administered 2019-11-25: 460 mg via INTRAVENOUS
  Filled 2019-11-25: qty 11.5

## 2019-11-25 MED ORDER — MORPHINE SULFATE (PF) 0.5 MG/ML IJ SOLN
INTRAMUSCULAR | Status: DC | PRN
  Administered 2019-11-25: .1 mg via EPIDURAL

## 2019-11-25 MED ORDER — DIPHENHYDRAMINE HCL 25 MG PO CAPS: 25.0000 mg | ORAL_CAPSULE | Freq: Four times a day (QID) | ORAL | Status: DC | PRN

## 2019-11-25 MED ORDER — COCONUT OIL OIL: 1.0000 "application " | TOPICAL_OIL | Status: DC | PRN

## 2019-11-25 MED ORDER — FENTANYL CITRATE (PF) 100 MCG/2ML IJ SOLN: 25.0000 ug | INTRAMUSCULAR | Status: DC | PRN

## 2019-11-25 MED ORDER — OXYTOCIN 40 UNITS IN NORMAL SALINE INFUSION - SIMPLE MED
2.5000 [IU]/h | INTRAVENOUS | Status: AC
  Administered 2019-11-25 (×2): 2.5 [IU]/h via INTRAVENOUS
  Filled 2019-11-25: qty 1000

## 2019-11-25 MED ORDER — MEPERIDINE HCL 25 MG/ML IJ SOLN: 6.2500 mg | INTRAMUSCULAR | Status: DC | PRN

## 2019-11-25 MED ORDER — NALOXONE HCL 4 MG/10ML IJ SOLN
1.0000 ug/kg/h | INTRAVENOUS | Status: DC | PRN
  Filled 2019-11-25: qty 5

## 2019-11-25 MED ORDER — SOD CITRATE-CITRIC ACID 500-334 MG/5ML PO SOLN
30.0000 mL | ORAL | Status: AC
  Administered 2019-11-25: 30 mL via ORAL
  Filled 2019-11-25: qty 30

## 2019-11-25 MED ORDER — LACTATED RINGERS IV SOLN
INTRAVENOUS | Status: DC
  Administered 2019-11-25: 08:00:00 via INTRAVENOUS

## 2019-11-25 MED ORDER — MEDROXYPROGESTERONE ACETATE 150 MG/ML IM SUSP
150.0000 mg | INTRAMUSCULAR | Status: DC | PRN
  Filled 2019-11-25: qty 1

## 2019-11-25 MED ORDER — ACETAMINOPHEN 500 MG PO TABS
1000.0000 mg | ORAL_TABLET | Freq: Four times a day (QID) | ORAL | Status: AC
  Administered 2019-11-25 – 2019-11-26 (×3): 1000 mg via ORAL
  Filled 2019-11-25 (×4): qty 2

## 2019-11-25 MED ORDER — LIDOCAINE HCL (PF) 2 % IJ SOLN
INTRAMUSCULAR | Status: AC
  Filled 2019-11-25: qty 10

## 2019-11-25 MED ORDER — BUPIVACAINE HCL (PF) 0.5 % IJ SOLN
INTRAMUSCULAR | Status: AC
  Filled 2019-11-25: qty 30

## 2019-11-25 MED ORDER — ONDANSETRON HCL 4 MG/2ML IJ SOLN: 4.0000 mg | Freq: Once | INTRAMUSCULAR | Status: DC | PRN

## 2019-11-25 MED ORDER — WITCH HAZEL-GLYCERIN EX PADS: 1.0000 "application " | MEDICATED_PAD | CUTANEOUS | Status: DC | PRN

## 2019-11-25 MED ORDER — PHENYLEPHRINE HCL (PRESSORS) 10 MG/ML IV SOLN
INTRAVENOUS | Status: AC
  Filled 2019-11-25: qty 1

## 2019-11-25 MED ORDER — IBUPROFEN 800 MG PO TABS
800.0000 mg | ORAL_TABLET | Freq: Four times a day (QID) | ORAL | Status: DC
  Administered 2019-11-26: 800 mg via ORAL
  Filled 2019-11-25: qty 1

## 2019-11-25 MED ORDER — SIMETHICONE 80 MG PO CHEW
80.0000 mg | CHEWABLE_TABLET | Freq: Three times a day (TID) | ORAL | Status: DC
  Administered 2019-11-26 (×2): 80 mg via ORAL
  Filled 2019-11-25 (×2): qty 1

## 2019-11-25 MED ORDER — KETOROLAC TROMETHAMINE 30 MG/ML IJ SOLN
INTRAMUSCULAR | Status: DC | PRN
  Administered 2019-11-25: 30 mg via INTRAVENOUS

## 2019-11-25 MED ORDER — BUPIVACAINE 0.25 % ON-Q PUMP DUAL CATH 400 ML
400.0000 mL | INJECTION | Status: DC
  Filled 2019-11-25: qty 400

## 2019-11-25 MED ORDER — MENTHOL 3 MG MT LOZG
1.0000 | LOZENGE | OROMUCOSAL | Status: DC | PRN
  Filled 2019-11-25: qty 9

## 2019-11-25 MED ORDER — SUCCINYLCHOLINE CHLORIDE 20 MG/ML IJ SOLN
INTRAMUSCULAR | Status: AC
  Filled 2019-11-25: qty 1

## 2019-11-25 SURGICAL SUPPLY — 25 items
BARRIER ADHS 3X4 INTERCEED (GAUZE/BANDAGES/DRESSINGS) ×3 IMPLANT
CANISTER SUCT 3000ML PPV (MISCELLANEOUS) ×3 IMPLANT
CATH KIT ON-Q SILVERSOAK 5IN (CATHETERS) ×6 IMPLANT
CHLORAPREP W/TINT 26 (MISCELLANEOUS) ×6 IMPLANT
COVER WAND RF STERILE (DRAPES) ×3 IMPLANT
DERMABOND ADVANCED (GAUZE/BANDAGES/DRESSINGS) ×2
DERMABOND ADVANCED .7 DNX12 (GAUZE/BANDAGES/DRESSINGS) ×1 IMPLANT
ELECT CAUTERY BLADE 6.4 (BLADE) IMPLANT
ELECT REM PT RETURN 9FT ADLT (ELECTROSURGICAL) ×3
ELECTRODE REM PT RTRN 9FT ADLT (ELECTROSURGICAL) ×1 IMPLANT
GLOVE SKINSENSE NS SZ8.0 LF (GLOVE) ×2
GLOVE SKINSENSE STRL SZ8.0 LF (GLOVE) ×1 IMPLANT
GOWN STRL REUS W/ TWL LRG LVL3 (GOWN DISPOSABLE) ×1 IMPLANT
GOWN STRL REUS W/ TWL XL LVL3 (GOWN DISPOSABLE) ×2 IMPLANT
GOWN STRL REUS W/TWL LRG LVL3 (GOWN DISPOSABLE) ×2
GOWN STRL REUS W/TWL XL LVL3 (GOWN DISPOSABLE) ×4
NS IRRIG 1000ML POUR BTL (IV SOLUTION) ×3 IMPLANT
PACK C SECTION AR (MISCELLANEOUS) ×3 IMPLANT
PAD OB MATERNITY 4.3X12.25 (PERSONAL CARE ITEMS) ×3 IMPLANT
PAD PREP 24X41 OB/GYN DISP (PERSONAL CARE ITEMS) ×3 IMPLANT
PENCIL SMOKE ULTRAEVAC 22 CON (MISCELLANEOUS) ×3 IMPLANT
SUT MAXON ABS #0 GS21 30IN (SUTURE) ×6 IMPLANT
SUT VIC AB 1 CT1 36 (SUTURE) ×9 IMPLANT
SUT VIC AB 2-0 CT1 36 (SUTURE) ×3 IMPLANT
SUT VIC AB 4-0 FS2 27 (SUTURE) ×3 IMPLANT

## 2019-11-25 NOTE — Interval H&P Note (Signed)
History and Physical Interval Note:  11/25/2019 7:10 AM  Carla Ingram  has presented today for surgery, with the diagnosis of Repeat CS.  The various methods of treatment have been discussed with the patient and family. After consideration of risks, benefits and other options for treatment, the patient has consented to  Procedure(s): CESAREAN SECTION (N/A) as a surgical intervention.  The patient's history has been reviewed, patient examined, no change in status, stable for surgery.  I have reviewed the patient's chart and labs.  Questions were answered to the patient's satisfaction.     Hoyt Koch

## 2019-11-25 NOTE — Op Note (Signed)
Cesarean Section Procedure Note Indications: prior cesarean section and term intrauterine pregnancy  Pre-operative Diagnosis: Intrauterine pregnancy [redacted]w[redacted]d ;  prior cesarean section and term intrauterine pregnancy Post-operative Diagnosis: same, delivered. Procedure: Low Transverse Cesarean Section Aspiration of Right Ovarian Cyst Surgeon: Barnett Applebaum, MD, FACOG Assistant(s): Dr Glennon Mac, No other capable assistant available, in surgery requiring high level assistant. Anesthesia: Spinal anesthesia Estimated Blood [id number] Complications: None; patient tolerated the procedure well. Disposition: PACU - hemodynamically stable. Condition: stable  Findings: A female infant in the cephalic presentation. "Thomas" Amniotic fluid - Meconium  Birth weight 7-1 lbs.  Apgars of 9 and 9.  Intact placenta with a three-vessel cord. Grossly normal uterus, tubes and ovaries bilaterally. Lower uterine segment intraabdominal adhesions were noted and the uterine wall itself was very (paper) thin. Right ovarian cyst with mucinous fluid noted on aspiration.  Procedure Details   The patient was taken to Operating Room, identified as the correct patient and the procedure verified as C-Section Delivery. A Time Out was held and the above information confirmed. After induction of anesthesia, the patient was draped and prepped in the usual sterile manner. A Pfannenstiel incision was made and carried down through the subcutaneous tissue to the fascia. Fascial incision was made and extended transversely with the Mayo scissors. The fascia was separated from the underlying rectus tissue superiorly and inferiorly. The peritoneum was identified and entered bluntly. Peritoneal incision was extended longitudinally. The utero-vesical peritoneal reflection was incised transversely and a bladder flap was created digitally.  A low transverse hysterotomy was made. The fetus was delivered atraumatically. The umbilical cord was clamped  x2 and cut and the infant was handed to the awaiting pediatricians. The placenta was removed intact and appeared normal with a 3-vessel cord.  The uterus was exteriorized and cleared of all clot and debris. The hysterotomy was closed with running sutures of 0 Vicryl suture. The right ovarian cyst was grasped an a small incision was made to aspirate mucinous-like fluid; no bleeding noted and the cyst wall was not further excised due to risk of worsening bleeding.   Excellent hemostasis was observed. The uterus was returned to the abdomen. The pelvis was irrigated and again, excellent hemostasis was noted.  The On Q Pain pump System was then placed.  Trocars were placed through the abdominal wall into the subfascial space and these were used to thread the silver soaker cathaters into place.The rectus fascia was then reapproximated with running sutures of Maxon, with careful placement not to incorporate the cathaters. Subcutaneous tissues are then irrigated with saline and hemostasis assured.  Skin is then closed with 4-0 vicryl suture in a subcuticular fashion followed by skin adhesive. The cathaters are flushed each with 5 mL of Bupivicaine and stabilized into place with dressing. Instrument, sponge, and needle counts were correct prior to the abdominal closure and at the conclusion of the case.  The patient tolerated the procedure well and was transferred to the recovery room in stable condition.   Barnett Applebaum, MD, [facility], [facility] 11/25/2019  8:35 AM

## 2019-11-25 NOTE — Lactation Note (Signed)
This note was copied from a baby's chart. Lactation Consultation Note  Patient Name: Carla Ingram CVELF'Y Date: 11/25/2019   [facility] in to see mom and baby [name] in L&D. Mom delivered via scheduled c/s to 4th baby, her first Carla. She has attempted breastfeeding with each of her other babies, but states that her production was always low, and she struggled with getting them to latch. [name] is latching independently, with wide open mouth, and flange lips per Transition RN. Mom is reporting tugging sensations when he is at the breast and no pain or discomfort. [place] educated and demonstrated hand expression to mom, encouraging her to hand express between feedings to help encourage the onset of milk production, and adequate volume for [name] as he grows. LC reviewed breastfeeding basics: newborn stomach size, cluster feeding, growth spurts, diapers, and early hunger cues. Encouraged skin to skin and feedings on cue.  Provided mom with information for breastfeeding assistance, and encouraged her to call out as needed.  Maternal Data    Feeding Feeding Type: Breast Fed  [facility] Score                   Interventions    Lactation Tools Discussed/Used     Consult Status      Lavonia Drafts 11/25/2019, 2:59 PM

## 2019-11-25 NOTE — Discharge Instructions (Signed)

## 2019-11-25 NOTE — Anesthesia Procedure Notes (Signed)
Spinal  Patient location during procedure: OR Start time: 11/25/2019 7:38 AM End time: 11/25/2019 7:42 AM Staffing Anesthesiologist: Gunnar Fusi, MD Resident/CRNA: Caryl Asp, CRNA Performed: resident/CRNA  Preanesthetic Checklist Completed: patient identified, site marked, surgical consent, pre-op evaluation, timeout performed, IV checked, risks and benefits discussed and monitors and equipment checked Spinal Block Patient position: sitting Prep: DuraPrep Patient monitoring: heart rate, cardiac monitor, continuous pulse ox and blood pressure Approach: midline Location: L3-4 Injection technique: single-shot Needle Needle type: Sprotte  Needle gauge: 24 G Needle length: 9 cm Assessment Sensory level: T4

## 2019-11-25 NOTE — Discharge Summary (Signed)
OB Discharge Summary     Patient Name: Carla Ingram DOB: 1996/10/21 MRN: [id number]  Date of admission: 11/25/2019 Delivering MD: Hoyt Koch, MD  Date of Delivery: 11/25/2019  Date of discharge: 11/26/2019  Admitting diagnosis: Repeat CS  At term due to 3 prior cesarean sections Intrauterine pregnancy: [redacted]w[redacted]d     Secondary diagnosis: None     Discharge diagnosis: Term Pregnancy Delivered, Reasons for cesarean section  Elective repeat                         Hospital course:  Sceduled C/S   23 y.o. yo [id number] at [redacted]w[redacted]d was admitted to the hospital 11/25/2019 for scheduled cesarean section with the following indication:Elective Repeat.  Membrane Rupture Time/Date: 7:55 AM ,11/25/2019   Patient delivered a Viable infant.11/25/2019  Details of operation can be found in separate operative note.  Patient had an uncomplicated postpartum course.  She is ambulating, tolerating a regular diet, passing flatus, and urinating well. Patient was encouraged to stay an extra night since she was less than 36 hours from delivery, however, she was meeting all expectations of recovery and had a strong desire to discharge to home. Patient is discharged home in stable condition on  11/26/19.                                                                        Post partum procedures: none  Complications: None  Physical exam on 11/26/2019: Vitals:   11/26/19 0425 11/26/19 0500 11/26/19 0835 11/26/19 1558  BP: 112/75  104/64 (!) 117/57  Pulse: 80 73 80 78  Resp: 17  18 20   Temp: 98.1 F (36.7 C)  98.4 F (36.9 C) 98.3 F (36.8 C)  TempSrc: Oral  Oral Oral  SpO2: 99% 99% 98% 98%  Weight:      Height:       General: alert, cooperative and no distress Lochia: appropriate Uterine Fundus: firm Incision: Dressing is clean, dry, and intact DVT Evaluation: No evidence of DVT seen on physical exam.  Labs: Lab Results  Component Value Date   WBC 12.3 (H) 11/26/2019   HGB 10.6 (L) 11/26/2019    HCT 30.2 (L) 11/26/2019   MCV 93.2 11/26/2019   PLT 276 11/26/2019   CMP Latest Ref Rng & Units 03/26/2018  Glucose 65 - 99 mg/dL 94  BUN 6 - 20 mg/dL 11  Creatinine 0.44 - 1.00 mg/dL 0.63  Sodium 135 - 145 mmol/L 136  Potassium 3.5 - 5.1 mmol/L 3.8  Chloride 101 - 111 mmol/L 105  CO2 22 - 32 mmol/L 23  Calcium 8.9 - 10.3 mg/dL 9.0  Total Protein 6.5 - 8.1 g/dL 6.9  Total Bilirubin 0.3 - 1.2 mg/dL 0.7  Alkaline Phos 38 - 126 U/L 92  AST 15 - 41 U/L 21  ALT 14 - 54 U/L 17    Discharge instruction: per After Visit Summary.  Medications:  Allergies as of 11/26/2019      Reactions   Macrobid [nitrofurantoin Macrocrystal] Other (See Comments)   Can not move her body, loses all control of arms legs    Augmentin [amoxicillin-pot Clavulanate] Hives, Swelling   Cephalexin Other (See Comments)   Achy  in bones   Penicillins Hives, Swelling   Did it involve swelling of the face/tongue/throat, SOB, or low BP? No Did it involve sudden or severe rash/hives, skin peeling, or any reaction on the inside of your mouth or nose? No Did you need to seek medical attention at a hospital or doctor's office? No When did it last happen?childhood allergy If all above answers are "NO", may proceed with cephalosporin use.      Medication List    TAKE these medications   acetaminophen 325 MG tablet Commonly known as: TYLENOL Take 650 mg by mouth every 6 (six) hours as needed for moderate pain.   multivitamin-prenatal 27-0.8 MG Tabs tablet Take 1 tablet by mouth daily at 12 noon.   oxyCODONE 5 MG immediate release tablet Commonly known as: Roxicodone Take 1 tablet (5 mg total) by mouth every 6 (six) hours as needed for up to 5 days for severe pain.            Discharge Care Instructions  (From admission, onward)         Start     Ordered   11/26/19 0000  Discharge wound care:    Comments: Keep incision dry, clean.   11/26/19 1630          Diet: routine diet  Activity:  Advance as tolerated. Pelvic rest for 6 weeks.   Outpatient follow up: Follow-up Information    Nadara Mustard, MD. Go in 2 week(s).   Specialty: Obstetrics and Gynecology Why: incision check Contact information: [address] [place] [address] [phone]             Postpartum contraception: Depo Provera Rhogam Given postpartum: no Rubella vaccine given postpartum: no Varicella vaccine given postpartum: no TDaP given antepartum or postpartum: Yes  Newborn Data: Live born female Maisie Fus Birth Weight: 7 lb 0.5 oz (3190 g) APGAR: 9, 9  Newborn Delivery   Birth date/time: 11/25/2019 07:57:00 Delivery type: C-Section, Low Transverse Trial of labor: No C-section categorization: Repeat       Baby Feeding: Formula at discharge  Disposition: home with mother  SIGNED: Tresea Mall, CNM 11/26/2019 4:30 PM

## 2019-11-25 NOTE — H&P (Signed)
History and Physical Interval Note:  11/25/2019 7:07 AM  Carla Ingram  has presented today for surgery, with the diagnosis of Repeat CS  The various methods of treatment have been discussed with the patient and family. After consideration of risks, benefits and other options for treatment, the patient has consented to  Procedure(s): CESAREAN SECTION (N/A) as a surgical intervention .  The patient's history has been reviewed, patient examined, no change in status, stable for surgery.  Pt has the following beta blocker history-  Not taking Beta Blocker.  I have reviewed the patient's chart and labs.  Questions were answered to the patient's satisfaction.    Barnett Applebaum, MD, [facility], [facility] 11/25/2019  7:07 AM

## 2019-11-25 NOTE — Anesthesia Preprocedure Evaluation (Signed)
Anesthesia Evaluation  Patient identified by MRN, date of birth, ID band Patient awake    Reviewed: Allergy & Precautions, NPO status , Patient's Chart, lab work & pertinent test results  History of Anesthesia Complications Negative for: history of anesthetic complications  Airway Mallampati: II       Dental   Pulmonary neg sleep apnea, neg COPD, Current Smoker,           Cardiovascular (-) hypertension(-) Past MI and (-) CHF (-) dysrhythmias (-) Valvular Problems/Murmurs     Neuro/Psych neg Seizures    GI/Hepatic Neg liver ROS, GERD  Medicated and Controlled,  Endo/Other  neg diabetes  Renal/GU negative Renal ROS     Musculoskeletal   Abdominal   Peds  Hematology  (+) anemia ,   Anesthesia Other Findings   Reproductive/Obstetrics                             Anesthesia Physical Anesthesia Plan  ASA: II  Anesthesia Plan: Spinal   Post-op Pain Management:    Induction:   PONV Risk Score and Plan:   Airway Management Planned:   Additional Equipment:   Intra-op Plan:   Post-operative Plan:   Informed Consent: I have reviewed the patients History and Physical, chart, labs and discussed the procedure including the risks, benefits and alternatives for the proposed anesthesia with the patient or authorized representative who has indicated his/her understanding and acceptance.       Plan Discussed with:   Anesthesia Plan Comments:         Anesthesia Quick Evaluation

## 2019-11-25 NOTE — Anesthesia Post-op Follow-up Note (Signed)
Anesthesia QCDR form completed.        

## 2019-11-25 NOTE — Transfer of Care (Signed)
Immediate Anesthesia Transfer of Care Note  Patient: Carla Ingram  Procedure(s) Performed: CESAREAN SECTION (N/A )  Patient Location: PACU and Mother/Baby  Anesthesia Type:Spinal  Level of Consciousness: awake, alert  and oriented  Airway & Oxygen Therapy: Patient Spontanous Breathing  Post-op Assessment: Report given to RN and Post -op Vital signs reviewed and stable  Post vital signs: Reviewed and stable  Last Vitals:  Vitals Value Taken Time  BP 110/69 11/25/19 0845  Temp 36.7 C 11/25/19 0845  Pulse 74 11/25/19 0845  Resp 15 11/25/19 0845  SpO2 98 % 11/25/19 0845    Last Pain:  Vitals:   11/25/19 0845  TempSrc: Oral  PainSc:          Complications: No apparent anesthesia complications

## 2019-11-26 ENCOUNTER — Encounter: Payer: Self-pay | Admitting: Obstetrics & Gynecology

## 2019-11-26 LAB — CBC
HCT: 30.2 % — ABNORMAL LOW (ref 36.0–46.0)
Hemoglobin: 10.6 g/dL — ABNORMAL LOW (ref 12.0–15.0)
MCH: 32.7 pg (ref 26.0–34.0)
MCHC: 35.1 g/dL (ref 30.0–36.0)
MCV: 93.2 fL (ref 80.0–100.0)
Platelets: 276 10*3/uL (ref 150–400)
RBC: 3.24 MIL/uL — ABNORMAL LOW (ref 3.87–5.11)
RDW: 12.9 % (ref 11.5–15.5)
WBC: 12.3 10*3/uL — ABNORMAL HIGH (ref 4.0–10.5)
nRBC: 0 % (ref 0.0–0.2)

## 2019-11-26 MED ORDER — OXYCODONE HCL 5 MG PO TABS: 5.0000 mg | ORAL_TABLET | Freq: Four times a day (QID) | ORAL | 0 refills | Status: AC | PRN

## 2019-11-26 NOTE — Lactation Note (Signed)
This note was copied from a baby's chart. Lactation Consultation Note  Patient Name: Carla Ingram Printup XNATF'T Date: 11/26/2019 Reason for consult: Follow-up assessment Mom states that she attempted breastfeeding with her other 3 kids and milk never "came in", she states she had no breast changes with this pregnancy, she informed me that her mother and sister all attempted breastfeeding and did not make milk, she did express a drop of colostrum on left breast when she attempted hand expression, I assisted her with latching baby to breast, baby fussy and pushing away, could not coordinate suck at first, after several attempts he did latch but fell asleep after several sucks , approx 1 min., mom has decided to formula feed but I encouraged her that we would assist her if she desired help if  she desired to attempt breast feeding again.  I wrote my name and ascom # on the white board in her room      Maternal Data Formula Feeding for Exclusion: Yes Has patient been taught Hand Expression?: Yes Does the patient have breastfeeding experience prior to this delivery?: Yes  Feeding Feeding Type: Breast Fed  LATCH Score Latch: Repeated attempts needed to sustain latch, nipple held in mouth throughout feeding, stimulation needed to elicit sucking reflex.  Audible Swallowing: None  Type of Nipple: Everted at rest and after stimulation  Comfort (Breast/Nipple): Soft / non-tender  Hold (Positioning): Assistance needed to correctly position infant at breast and maintain latch.  LATCH Score: 6  Interventions Interventions: Breast feeding basics reviewed;Assisted with latch;Skin to skin;Breast massage;Hand express;Adjust position;Support pillows  Lactation Tools Discussed/Used     Consult Status Consult Status: Complete    Ferol Luz 11/26/2019, 1:18 PM

## 2019-11-26 NOTE — Progress Notes (Signed)
Reviewed D/C instructions with pt and family. Pt verbalized understanding of teaching. Discharged to home via W/C. Pt to schedule f/u appt.  

## 2019-11-26 NOTE — Progress Notes (Signed)
Obstetric Postpartum/PostOperative Daily Progress Note Subjective:  23 y.o. [id number] post-operative day # 1 status post repeat cesarean delivery.  She is ambulating, is tolerating po, is voiding spontaneously.  Her pain is well controlled on PO pain medications. Her lochia is less than menses. She is breast and formula feeding. She is anxious to have her On Q pump discontinued. She does not think it is providing any pain relief. She is also anxious to go home. We discussed revisiting the idea at the end of the day pending progress and newborn screens.    Medications SCHEDULED MEDICATIONS  . acetaminophen  1,000 mg Oral Q6H  . ketorolac  30 mg Intravenous Q6H   Followed by  . ibuprofen  800 mg Oral Q6H  . ketorolac  30 mg Intravenous Once  . prenatal multivitamin  1 tablet Oral Q1200  . senna-docusate  2 tablet Oral Q24H  . simethicone  80 mg Oral TID PC  . simethicone  80 mg Oral Q24H    MEDICATION INFUSIONS  . bupivacaine 0.25 % ON-Q pump DUAL CATH 400 mL    . bupivacaine ON-Q pain pump    . lactated ringers    . naLOXone [facility] [place]) adult infusion for PRURITIS      PRN MEDICATIONS  acetaminophen, coconut oil, witch hazel-glycerin **AND** dibucaine, diphenhydrAMINE **OR** diphenhydrAMINE, diphenhydrAMINE, medroxyPROGESTERone, menthol-cetylpyridinium, morphine injection, nalbuphine **OR** nalbuphine, nalbuphine **OR** nalbuphine, naloxone **AND** sodium chloride flush, naLOXone (NARCAN) adult infusion for PRURITIS, oxyCODONE-acetaminophen, simethicone, zolpidem    Objective:   Vitals:   11/26/19 0400 11/26/19 0425 11/26/19 0500 11/26/19 0835  BP:  112/75  104/64  Pulse: 82 80 73 80  Resp:  17  18  Temp:  98.1 F (36.7 C)  98.4 F (36.9 C)  TempSrc:  Oral  Oral  SpO2: 99% 99% 99% 98%  Weight:      Height:        Current Vital Signs 24h Vital Sign Ranges  T 98.4 F (36.9 C) Temp  Avg: 98.1 F (36.7 C)  Min: 97.5 F (36.4 C)  Max: 98.4 F (36.9 C)  BP 104/64 BP  Min: 86/63   Max: 112/75  HR 80 Pulse  Avg: 76.1  Min: 56  Max: 98  RR 18 Resp  Avg: 17.1  Min: 8  Max: 25  SaO2 98 % Room Air SpO2  Avg: 99.2 %  Min: 98 %  Max: 100 %       24 Hour I/O Current Shift I/O  Time Ins Outs 12/03 0701 - 12/04 0700 In: 657.1 [I.V.:657.1] Out: 1840 [Urine:1140] No intake/output data recorded.  General: NAD Pulmonary: no increased work of breathing Abdomen: non-distended, non-tender, fundus firm at level of umbilicus Inc: Clean/dry/intact, On Q pump intact Extremities: no edema, no erythema, no tenderness  Labs:  Recent Labs  Lab 11/23/19 0854 11/26/19 0612  WBC 12.1* 12.3*  HGB 12.5 10.6*  HCT 36.4 30.2*  PLT 341 276     Assessment:   23 y.o. [id number] postoperative day # 1 status post repeat cesarean section, lactating  Plan:  1) Acute blood loss anemia - hemodynamically stable and asymptomatic - po ferrous sulfate  2) May discontinue On Q pump per patient preference  3) O POS / Rubella 1.96 (05/18 1348)/ Varicella Immune  4) TDAP status UTD  5) breast and bottle /Contraception = Depo Provera  6) Disposition: continue current care with possibility of discharge to home later today   Rod Can, CNM 11/26/2019 10:11 AM

## 2019-11-30 ENCOUNTER — Telehealth: Payer: Self-pay

## 2019-11-30 NOTE — Anesthesia Postprocedure Evaluation (Signed)
Anesthesia Post Note  Patient: Carla Ingram  Procedure(s) Performed: CESAREAN SECTION (N/A )  Anesthesia Type: Spinal Comments: Patient discharged prior to anesthesia evaluation.     Last Vitals:  Vitals:   11/26/19 0835 11/26/19 1558  BP: 104/64 (!) 117/57  Pulse: 80 78  Resp: 18 20  Temp: 36.9 C 36.8 C  SpO2: 98% 98%    Last Pain:  Vitals:   11/26/19 1558  TempSrc: Oral  PainSc:                  Alison Stalling

## 2019-11-30 NOTE — Telephone Encounter (Signed)
Pt calling; had c/s 12/3rd; has a little spot on right side of incision that looks like it's been bleeding last night, not bad; what to do?  [phone]  Pt states it is at the end of the incision and is smaller than the end of her finger.  Adv to put a bandaid on it, keep it clean, and if bleeds more to apply pressure.

## 2019-12-03 ENCOUNTER — Ambulatory Visit: Payer: Medicaid Other | Admitting: Obstetrics & Gynecology

## 2019-12-07 ENCOUNTER — Ambulatory Visit (INDEPENDENT_AMBULATORY_CARE_PROVIDER_SITE_OTHER): Payer: Medicaid Other | Admitting: Obstetrics & Gynecology

## 2019-12-07 ENCOUNTER — Other Ambulatory Visit: Payer: Self-pay

## 2019-12-07 ENCOUNTER — Encounter: Payer: Self-pay | Admitting: Obstetrics & Gynecology

## 2019-12-07 DIAGNOSIS — Z9889 Other specified postprocedural states: Secondary | ICD-10-CM

## 2019-12-07 MED ORDER — MEDROXYPROGESTERONE ACETATE 150 MG/ML IM SUSP: 150.0000 mg | INTRAMUSCULAR | 3 refills | Status: DC

## 2019-12-07 NOTE — Progress Notes (Signed)
  Postoperative Follow-up Patient presents post op from recent Cesarean Section performed for Elective repeat, 2 weeks ago.   Subjective: Patient reports marked improvement in her immediate post op symptoms. Eating a regular diet without difficulty. The patient is not having any pain.  Activity: normal activities of daily living. Patient reports additional symptom's since surgery of appropriate lochia, no signs of depression, and no signs of mastitis.  Objective: BP 100/60   Ht 5\' 1"  (1.549 m)   Wt 187 lb (84.8 kg)   LMP 02/20/2019   BMI 35.33 kg/m  Physical Exam Constitutional:      General: She is not in acute distress.    Appearance: She is well-developed.  Cardiovascular:     Rate and Rhythm: Normal rate.  Pulmonary:     Effort: Pulmonary effort is normal.  Abdominal:     General: There is no distension.     Palpations: Abdomen is soft.     Tenderness: There is no abdominal tenderness.     Comments: Incision Healing Well   Musculoskeletal:        General: Normal range of motion.  Neurological:     Mental Status: She is alert and oriented to person, place, and time.     Cranial Nerves: No cranial nerve deficit.  Skin:    General: Skin is warm and dry.     Assessment: s/p : Cesarean Section for Elective repeat stable  Plan: Patient has done well after her Cesarean Section with no apparent complications.  I have discussed the post-operative course to date, and the expected progress moving forward.  The patient understands what complications to be concerned about.  I will see the patient in routine follow up, or sooner if needed.    Activity plan: No heavy lifting.Marland Kitchen  Pelvic rest. She desires Depo-Provera for postpartum contraception.  Hoyt Koch 12/07/2019, 3:15 PM

## 2020-01-07 ENCOUNTER — Ambulatory Visit: Payer: Medicaid Other | Admitting: Obstetrics & Gynecology

## 2020-01-07 ENCOUNTER — Telehealth: Payer: Self-pay | Admitting: Obstetrics & Gynecology

## 2020-01-07 ENCOUNTER — Other Ambulatory Visit: Payer: Self-pay

## 2020-01-07 MED ORDER — MEDROXYPROGESTERONE ACETATE 150 MG/ML IM SUSP: 150.0000 mg | INTRAMUSCULAR | 3 refills | Status: DC

## 2020-01-07 NOTE — Telephone Encounter (Signed)
Please send depo in

## 2020-01-07 NOTE — Telephone Encounter (Signed)
done

## 2020-01-17 ENCOUNTER — Ambulatory Visit: Payer: Medicaid Other | Admitting: Obstetrics & Gynecology

## 2020-01-25 ENCOUNTER — Ambulatory Visit: Payer: Medicaid Other | Admitting: Family Medicine

## 2020-01-31 DIAGNOSIS — Z20822 Contact with and (suspected) exposure to covid-19: Secondary | ICD-10-CM | POA: Diagnosis not present

## 2020-02-08 ENCOUNTER — Encounter: Payer: Self-pay | Admitting: Obstetrics & Gynecology

## 2020-02-08 ENCOUNTER — Other Ambulatory Visit: Payer: Self-pay

## 2020-02-08 ENCOUNTER — Ambulatory Visit (INDEPENDENT_AMBULATORY_CARE_PROVIDER_SITE_OTHER): Payer: Medicaid Other | Admitting: Obstetrics & Gynecology

## 2020-02-08 VITALS — BP 120/70 | Ht 61.0 in | Wt 191.0 lb

## 2020-02-08 DIAGNOSIS — R300 Dysuria: Secondary | ICD-10-CM

## 2020-02-08 DIAGNOSIS — Z3202 Encounter for pregnancy test, result negative: Secondary | ICD-10-CM

## 2020-02-08 DIAGNOSIS — Z3042 Encounter for surveillance of injectable contraceptive: Secondary | ICD-10-CM | POA: Diagnosis not present

## 2020-02-08 LAB — POCT URINALYSIS DIPSTICK
Bilirubin, UA: NEGATIVE
Blood, UA: NEGATIVE
Glucose, UA: NEGATIVE
Ketones, UA: NEGATIVE
Nitrite, UA: POSITIVE
Protein, UA: NEGATIVE
Spec Grav, UA: 1.01 (ref 1.010–1.025)
Urobilinogen, UA: 0.2 E.U./dL
pH, UA: 5 (ref 5.0–8.0)

## 2020-02-08 LAB — POCT URINE PREGNANCY: Preg Test, Ur: NEGATIVE

## 2020-02-08 MED ORDER — SULFAMETHOXAZOLE-TRIMETHOPRIM 800-160 MG PO TABS: 1.0000 | ORAL_TABLET | Freq: Two times a day (BID) | ORAL | 0 refills | Status: DC

## 2020-02-08 MED ORDER — MEDROXYPROGESTERONE ACETATE 150 MG/ML IM SUSP
150.0000 mg | Freq: Once | INTRAMUSCULAR | Status: AC
  Administered 2020-02-08: 10:00:00 150 mg via INTRAMUSCULAR

## 2020-02-08 NOTE — Progress Notes (Signed)
  OBSTETRICS POSTPARTUM CLINIC PROGRESS NOTE  Subjective:     Carla Ingram is a 24 y.o. [phone] female who presents for a postpartum visit. She is 6 weeks postpartum following a Term pregnancy and delivery by C-section.  I have fully reviewed the prenatal and intrapartum course. Anesthesia: spinal.  Postpartum course has been complicated by uncomplicated.  Baby is feeding by Bottle.  Bleeding: patient has not  resumed menses.  Bowel function is normal. Bladder function is normal.  Patient is not sexually active. Contraception method desired is Depo-Provera injections.  Postpartum depression screening: negative. Edinburgh 0.  The following portions of the patient's history were reviewed and updated as appropriate: allergies, current medications, past family history, past medical history, past social history, past surgical history and problem list.  Review of Systems Pertinent items are noted in HPI.  Objective:    BP 120/70   Ht 5\' 1"  (1.549 m)   Wt 191 lb (86.6 kg)   LMP 01/06/2019   BMI 36.09 kg/m   General:  alert and no distress   Breasts:  inspection negative, no nipple discharge or bleeding, no masses or nodularity palpable  Lungs: clear to auscultation bilaterally  Heart:  regular rate and rhythm, S1, S2 normal, no murmur, click, rub or gallop  Abdomen: soft, non-tender; bowel sounds normal; no masses,  no organomegaly.  Well healed Pfannenstiel incision   Vulva:  normal  Vagina: normal vagina, no discharge, exudate, lesion, or erythema  Cervix:  no cervical motion tenderness and no lesions  Corpus: normal size, contour, position, consistency, mobility, non-tender  Adnexa:  normal adnexa and no mass, fullness, tenderness  Rectal Exam: Not performed.          Assessment:  Post Partum Care visit 1. Encounter for Depo-Provera contraception - POCT urine pregnancy  2. Dysuria - POCT urinalysis dipstick - Urine Culture  3. Postpartum care following cesarean  delivery   Plan:  See orders and Patient Instructions Contraceptive counseling for Depo-Provera Resume all normal activities Follow up in: 3 months for annual/PAP and next Depo injection; or as needed.   01/08/2019, MD, Annamarie Major Ob/Gyn, [facility] 02/08/2020  9:41 AM

## 2020-02-08 NOTE — Addendum Note (Signed)
Addended by: Nadara Mustard on: 02/08/2020 02:03 PM   Modules accepted: Orders

## 2020-02-08 NOTE — Addendum Note (Signed)
Addended by: Cornelius Moras D on: 02/08/2020 10:04 AM   Modules accepted: Orders

## 2020-02-09 ENCOUNTER — Telehealth: Payer: Self-pay

## 2020-02-09 ENCOUNTER — Other Ambulatory Visit: Payer: Self-pay | Admitting: Obstetrics & Gynecology

## 2020-02-09 DIAGNOSIS — Z20822 Contact with and (suspected) exposure to covid-19: Secondary | ICD-10-CM | POA: Diagnosis not present

## 2020-02-09 MED ORDER — CIPROFLOXACIN HCL 500 MG PO TABS: 500.0000 mg | ORAL_TABLET | Freq: Two times a day (BID) | ORAL | 0 refills | Status: DC

## 2020-02-09 NOTE — Telephone Encounter (Signed)
Pt calling stating that the antibiotic she was RX'ed yesterday is making her legs feel like they are on fire. Requesting a different antibiotic. Please advise.

## 2020-02-09 NOTE — Telephone Encounter (Signed)
Pt wants to know can Keflex be sent in, it works for her.

## 2020-02-09 NOTE — Telephone Encounter (Signed)
Let her know ABX changed to Cipro for 7 days

## 2020-02-10 LAB — URINE CULTURE

## 2020-04-05 ENCOUNTER — Ambulatory Visit (INDEPENDENT_AMBULATORY_CARE_PROVIDER_SITE_OTHER)
Admission: RE | Admit: 2020-04-05 | Discharge: 2020-04-05 | Disposition: A | Discharge status: Home / Self Care | Payer: Medicaid Other | Source: Ambulatory Visit

## 2020-04-05 DIAGNOSIS — J3089 Other allergic rhinitis: Secondary | ICD-10-CM

## 2020-04-05 DIAGNOSIS — R059 Cough, unspecified: Secondary | ICD-10-CM

## 2020-04-05 DIAGNOSIS — R05 Cough: Secondary | ICD-10-CM | POA: Diagnosis not present

## 2020-04-05 MED ORDER — FLUTICASONE PROPIONATE 50 MCG/ACT NA SUSP: 1.0000 | Freq: Every day | NASAL | 0 refills | Status: DC

## 2020-04-05 MED ORDER — CETIRIZINE HCL 10 MG PO TABS: 10.0000 mg | ORAL_TABLET | Freq: Every day | ORAL | 0 refills | Status: DC

## 2020-04-05 MED ORDER — BENZONATATE 100 MG PO CAPS: 100.0000 mg | ORAL_CAPSULE | Freq: Three times a day (TID) | ORAL | 0 refills | Status: DC

## 2020-04-05 NOTE — Discharge Instructions (Signed)
Follow-up with PCP Advised to have COVID-19 test completed

## 2020-04-05 NOTE — ED Provider Notes (Signed)
Virtual Visit via Video Note:  Carla Ingram  initiated request for Telemedicine visit with [facility] team. I connected with Carla Ingram  on 04/05/2020 at 3:00 PM  for a synchronized telemedicine visit using a video enabled HIPPA compliant telemedicine application. I verified that I am speaking with Carla Ingram  using two identifiers. Emerson Monte, FNP  was physically located in a [place] [facility] site and DUA MEHLER was located at a different location.   The limitations of evaluation and management by telemedicine as well as the availability of in-person appointments were discussed. Patient was informed that she  may incur a bill ( including co-pay) for this virtual visit encounter. Carla Ingram  expressed understanding and gave verbal consent to proceed with virtual visit.     History of Present Illness:Carla Ingram  is a 24 y.o. female presents via telehealth with a complaint of cough, congestion and sneezing for the past 3 days.  Denies sick exposure to COVID, flu or strep.  Denies recent travel.  Denies aggravating or alleviating symptoms.  Denies previous COVID infection.   Denies fever, chills, fatigue, rhinorrhea, sore throat, SOB, wheezing, chest pain, nausea, vomiting, changes in bowel or bladder habits.    Past Medical History:  Diagnosis Date  . Anemia   . GERD (gastroesophageal reflux disease)    OCC  . Migraine   . Placental abruption    2012 at 35 wks  . Positive pregnancy test   . Psoriasis   . TMJ (dislocation of temporomandibular joint)     Allergies  Allergen Reactions  . Macrobid [Nitrofurantoin Macrocrystal] Other (See Comments)    Can not move her body, loses all control of arms legs   . Augmentin [Amoxicillin-Pot Clavulanate] Hives and Swelling  . Cephalexin Other (See Comments)    Achy in bones   . Penicillins Hives and Swelling    Did it involve swelling of the face/tongue/throat, SOB, or low BP? No Did it involve  sudden or severe rash/hives, skin peeling, or any reaction on the inside of your mouth or nose? No Did you need to seek medical attention at a hospital or doctor's office? No When did it last happen?childhood allergy If all above answers are "NO", may proceed with cephalosporin use.         Observations/Objective: VITALS: Per patient if applicable, see vitals. GENERAL: Alert, appears well and in no acute distress. HEENT: Atraumatic, conjunctiva clear, no obvious abnormalities on inspection of external nose and ears. NECK: Normal movements of the head and neck. CARDIOPULMONARY: No increased WOB. Speaking in clear sentences. I:E ratio WNL.  MS: Moves all visible extremities without noticeable abnormality. PSYCH: Pleasant and cooperative, well-groomed. Speech normal rate and rhythm. Affect is appropriate. Insight and judgement are appropriate. Attention is focused, linear, and appropriate.  NEURO: CN grossly intact. Oriented as arrived to appointment on time with no prompting. Moves both UE equally.  SKIN: No obvious lesions, wounds, erythema, or cyanosis noted on face or hands.     Assessment and Plan:   ICD-10-CM   1. Seasonal allergic rhinitis due to other allergic trigger  J30.89 cetirizine (ZYRTEC ALLERGY) 10 MG tablet    fluticasone (FLONASE) 50 MCG/ACT nasal spray  2. Cough  R05 benzonatate (TESSALON) 100 MG capsule    Follow Up Instructions: Follow-up with PCP Return for a face-to-face visit if symptom does not resolve  was advised to have COVID-19 test completed.   I discussed  the assessment and treatment plan with the patient. The patient was provided an opportunity to ask questions and all were answered. The patient agreed with the plan and demonstrated an understanding of the instructions.   The patient was advised to call back or seek an in-person evaluation if the symptoms worsen or if the condition fails to improve as anticipated.  I provided  15  minutes of  non-face-to-face time during this encounter.    Durward Parcel, FNP  04/05/2020 3:16 PM         Durward Parcel, FNP 04/05/20 1517

## 2020-04-26 DIAGNOSIS — Z20822 Contact with and (suspected) exposure to covid-19: Secondary | ICD-10-CM | POA: Diagnosis not present

## 2020-05-02 ENCOUNTER — Telehealth: Payer: Self-pay

## 2020-05-02 NOTE — Telephone Encounter (Signed)
Patient inquiring if [facility] has sent in her Depo Refill. She has apt tomorrow for administration. HE#[phone]

## 2020-05-02 NOTE — Telephone Encounter (Signed)
Spoke w/patient. Advised rx sent 01/07/20 with refills for 1 year. Advised to contact pharmacy to process refill.

## 2020-05-03 ENCOUNTER — Ambulatory Visit: Payer: Medicaid Other | Admitting: Obstetrics & Gynecology

## 2020-05-05 ENCOUNTER — Ambulatory Visit: Payer: Medicaid Other

## 2020-05-11 ENCOUNTER — Ambulatory Visit: Payer: Medicaid Other

## 2020-05-16 ENCOUNTER — Ambulatory Visit: Payer: Medicaid Other

## 2020-05-16 ENCOUNTER — Other Ambulatory Visit: Payer: Self-pay

## 2020-05-16 DIAGNOSIS — Z3042 Encounter for surveillance of injectable contraceptive: Secondary | ICD-10-CM

## 2020-05-16 MED ORDER — MEDROXYPROGESTERONE ACETATE 150 MG/ML IM SUSP
150.0000 mg | Freq: Once | INTRAMUSCULAR | Status: AC
  Administered 2020-05-16: 150 mg via INTRAMUSCULAR

## 2020-05-19 ENCOUNTER — Ambulatory Visit: Payer: Medicaid Other | Admitting: Obstetrics & Gynecology

## 2020-05-23 ENCOUNTER — Ambulatory Visit: Payer: Medicaid Other | Admitting: Obstetrics & Gynecology

## 2020-08-08 ENCOUNTER — Ambulatory Visit: Payer: Medicaid Other

## 2020-08-10 ENCOUNTER — Other Ambulatory Visit: Payer: Self-pay

## 2020-08-10 ENCOUNTER — Ambulatory Visit (INDEPENDENT_AMBULATORY_CARE_PROVIDER_SITE_OTHER): Payer: Medicaid Other

## 2020-08-10 DIAGNOSIS — Z3042 Encounter for surveillance of injectable contraceptive: Secondary | ICD-10-CM | POA: Diagnosis not present

## 2020-08-10 MED ORDER — MEDROXYPROGESTERONE ACETATE 150 MG/ML IM SUSP
150.0000 mg | Freq: Once | INTRAMUSCULAR | Status: AC
  Administered 2020-08-10: 150 mg via INTRAMUSCULAR

## 2020-09-14 ENCOUNTER — Ambulatory Visit: Payer: Medicaid Other | Admitting: Obstetrics & Gynecology

## 2020-11-02 ENCOUNTER — Ambulatory Visit: Payer: Medicaid Other

## 2020-12-28 ENCOUNTER — Ambulatory Visit: Payer: Medicaid Other

## 2021-01-06 ENCOUNTER — Ambulatory Visit
Admission: EM | Admit: 2021-01-06 | Discharge: 2021-01-06 | Disposition: A | Discharge status: Home / Self Care | Payer: Medicaid Other | Attending: Family Medicine | Admitting: Family Medicine

## 2021-01-06 ENCOUNTER — Other Ambulatory Visit: Payer: Self-pay

## 2021-01-06 ENCOUNTER — Encounter: Payer: Self-pay | Admitting: Emergency Medicine

## 2021-01-06 DIAGNOSIS — B9789 Other viral agents as the cause of diseases classified elsewhere: Secondary | ICD-10-CM | POA: Diagnosis not present

## 2021-01-06 DIAGNOSIS — R059 Cough, unspecified: Secondary | ICD-10-CM | POA: Insufficient documentation

## 2021-01-06 DIAGNOSIS — Z20822 Contact with and (suspected) exposure to covid-19: Secondary | ICD-10-CM | POA: Insufficient documentation

## 2021-01-06 DIAGNOSIS — R519 Headache, unspecified: Secondary | ICD-10-CM | POA: Insufficient documentation

## 2021-01-06 DIAGNOSIS — J988 Other specified respiratory disorders: Secondary | ICD-10-CM | POA: Insufficient documentation

## 2021-01-06 DIAGNOSIS — R0981 Nasal congestion: Secondary | ICD-10-CM | POA: Diagnosis not present

## 2021-01-06 DIAGNOSIS — F17210 Nicotine dependence, cigarettes, uncomplicated: Secondary | ICD-10-CM | POA: Insufficient documentation

## 2021-01-06 MED ORDER — KETOROLAC TROMETHAMINE 10 MG PO TABS: 10.0000 mg | ORAL_TABLET | Freq: Four times a day (QID) | ORAL | 0 refills | Status: DC | PRN

## 2021-01-06 MED ORDER — IPRATROPIUM BROMIDE 0.06 % NA SOLN: 2.0000 | Freq: Four times a day (QID) | NASAL | 0 refills | Status: DC | PRN

## 2021-01-06 NOTE — ED Triage Notes (Signed)
Patient c/o cough, nasal congestion and headache that started 3-4 days ago.  Patient denies fevers.

## 2021-01-06 NOTE — Discharge Instructions (Signed)
Medication as prescribed.  Stay home.  Check my chart for COVID test results.  Take care  Dr. Steaven Wholey   

## 2021-01-07 LAB — SARS CORONAVIRUS 2 (TAT 6-24 HRS): SARS Coronavirus 2: NEGATIVE

## 2021-01-07 NOTE — ED Provider Notes (Signed)
[facility]    CSN: [id number] Arrival date & time: 01/06/21  1452  History   Chief Complaint Chief Complaint  Patient presents with  . Headache  . Nasal Congestion  . Cough   HPI  25 year old female presents with the above complaints.  Patient reports that she has had 3 to 4 days of congestion and headache.  She has had some cough as well.  No fever.  Has had exposure to COVID-19.  She is concerned that this is the culprit of her symptoms.  She is most bothered by the congestion.  No fever.  No other reported symptoms.  No other complaints.  Past Medical History:  Diagnosis Date  . Anemia   . GERD (gastroesophageal reflux disease)    OCC  . Migraine   . Placental abruption    2012 at 35 wks  . Positive pregnancy test   . Psoriasis   . TMJ (dislocation of temporomandibular joint)     Patient Active Problem List   Diagnosis Date Noted  . Ovarian cyst, right 11/25/2019  . Low back pain during pregnancy, antepartum 11/09/2019  . Back pain affecting pregnancy, antepartum 08/02/2019  . Postpartum care following cesarean delivery 07/11/2016  . History of 3 cesarean sections 07/09/2016  . B12 deficiency 11/01/2015  . Tobacco use disorder 11/01/2015  . 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 Other psoriasis 02/26/2010    Past Surgical History:  Procedure Laterality Date  . CESAREAN SECTION  2012, 2016  . CESAREAN SECTION N/A 07/09/2016   Procedure: CESAREAN SECTION;  Surgeon: Nadara Mustard, MD;  Location: [facility];  Service: Obstetrics;  Laterality: N/A;  . CESAREAN SECTION N/A 11/25/2019   Procedure: CESAREAN SECTION;  Surgeon: Nadara Mustard, MD;  Location: [facility] OR[facility];  Service: Obstetrics;  Laterality: N/A;    OB History    Gravida  4   Para  4   Term  3   Preterm  1   AB      Living  4     SAB      IAB      Ectopic      Multiple  0   Live Births  4            Home Medications    Prior to Admission medications    Medication Sig Start Date End Date Taking? Authorizing Provider  ipratropium (ATROVENT) 0.06 % nasal spray Place 2 sprays into both nostrils 4 (four) times daily as needed for rhinitis. 01/06/21  Yes Demetreus Lothamer G, DO  ketorolac (TORADOL) 10 MG tablet Take 1 tablet (10 mg total) by mouth every 6 (six) hours as needed for moderate pain or severe pain. 01/06/21  Yes Ashish Rossetti G, DO  medroxyPROGESTERone (DEPO-PROVERA) 150 MG/ML injection Inject 1 mL (150 mg total) into the muscle every 3 (three) months. 01/07/20  Yes Nadara Mustard, MD  Prenatal Vit-Fe Fumarate-FA (MULTIVITAMIN-PRENATAL) 27-0.8 MG TABS tablet Take 1 tablet by mouth daily at 12 noon.   Yes [provider]  cetirizine (ZYRTEC ALLERGY) 10 MG tablet Take 1 tablet (10 mg total) by mouth daily. 04/05/20   Avegno, Zachery Dakins, FNP  fluticasone (FLONASE) 50 MCG/ACT nasal spray Place 1 spray into both nostrils daily for 14 days. 04/05/20 04/19/20  Durward Parcel, FNP    Family History Family History  Problem Relation Age of Onset  . CVA Mother   . Stroke Mother        during pregnancy  Social History Social History   Tobacco Use  . Smoking status: Current Every Day Smoker    Packs/day: 0.50    Years: 11.00    Pack years: 5.50    Types: Cigarettes    Start date: 10/31/2004  . Smokeless tobacco: Never Used  Vaping Use  . Vaping Use: Never used  Substance Use Topics  . Alcohol use: Not Currently    Alcohol/week: 8.0 standard drinks    Types: 8 Shots of liquor per week    Comment: she binge drinks when has a weekend without kids - rum  . Drug use: No     Allergies   Macrobid [nitrofurantoin macrocrystal], Augmentin [amoxicillin-pot clavulanate], Cephalexin, and Penicillins   Review of Systems Review of Systems  HENT: Positive for congestion.   Neurological: Positive for headaches.   Physical Exam Triage Vital Signs ED Triage Vitals  Enc Vitals Group     BP 01/06/21 1516 126/77     Pulse Rate 01/06/21  1516 94     Resp 01/06/21 1516 14     Temp 01/06/21 1516 98.1 F (36.7 C)     Temp Source 01/06/21 1516 Oral     SpO2 01/06/21 1516 97 %     Weight 01/06/21 1513 180 lb (81.6 kg)     Height 01/06/21 1513 5\' 1"  (1.549 m)     Head Circumference --      Peak Flow --      Pain Score 01/06/21 1513 8     Pain Loc --      Pain Edu? --      Excl. in GC? --    Updated Vital Signs BP 126/77 (BP Location: Left Arm)   Pulse 94   Temp 98.1 F (36.7 C) (Oral)   Resp 14   Ht 5\' 1"  (1.549 m)   Wt 81.6 kg   SpO2 97%   Breastfeeding No   BMI 34.01 kg/m   Visual Acuity Right Eye Distance:   Left Eye Distance:   Bilateral Distance:    Right Eye Near:   Left Eye Near:    Bilateral Near:     Physical Exam Vitals and nursing note reviewed.  Constitutional:      Appearance: She is well-developed.  HENT:     Head: Normocephalic and atraumatic.     Nose: Congestion present.  Eyes:     General:        Right eye: No discharge.        Left eye: No discharge.     Conjunctiva/sclera: Conjunctivae normal.  Cardiovascular:     Rate and Rhythm: Normal rate and regular rhythm.     Heart sounds: No murmur heard.   Pulmonary:     Effort: Pulmonary effort is normal.     Breath sounds: Normal breath sounds. No wheezing, rhonchi or rales.  Neurological:     Mental Status: She is alert.  Psychiatric:        Mood and Affect: Mood normal.        Behavior: Behavior normal.    UC Treatments / Results  Labs (all labs ordered are listed, but only abnormal results are displayed) Labs Reviewed  SARS CORONAVIRUS 2 (TAT 6-24 HRS)    EKG   Radiology No results found.  Procedures Procedures (including critical care time)  Medications Ordered in UC Medications - No data to display  Initial Impression / Assessment and Plan / UC Course  I have reviewed the triage vital signs and  the nursing notes.  Pertinent labs & imaging results that were available during my care of the patient were  reviewed by me and considered in my medical decision making (see chart for details).    25 year old female presents with viral respiratory infection.  COVID testing has returned negative. Treating with toradol and Atrovent.  Final Clinical Impressions(s) / UC Diagnoses   Final diagnoses:  Viral respiratory infection     Discharge Instructions     Medication as prescribed.  Stay home.  Check my chart for COVID test results.  Take care  Dr. Adriana Simas     ED Prescriptions    Medication Sig Dispense Auth. Provider   ipratropium (ATROVENT) 0.06 % nasal spray Place 2 sprays into both nostrils 4 (four) times daily as needed for rhinitis. 15 mL Yaris Ferrell G, DO   ketorolac (TORADOL) 10 MG tablet Take 1 tablet (10 mg total) by mouth every 6 (six) hours as needed for moderate pain or severe pain. 20 tablet Tommie Sams, DO     PDMP not reviewed this encounter.   Everlene Other [place], [place] 01/07/21 [phone]

## 2021-01-08 ENCOUNTER — Ambulatory Visit: Payer: Medicaid Other

## 2021-01-26 ENCOUNTER — Ambulatory Visit: Payer: Medicaid Other | Admitting: Obstetrics & Gynecology

## 2021-05-10 ENCOUNTER — Ambulatory Visit (LOCAL_COMMUNITY_HEALTH_CENTER): Payer: Medicaid Other

## 2021-05-10 ENCOUNTER — Other Ambulatory Visit: Payer: Self-pay

## 2021-05-10 VITALS — BP 104/64 | Ht 61.0 in | Wt 199.0 lb

## 2021-05-10 DIAGNOSIS — Z3202 Encounter for pregnancy test, result negative: Secondary | ICD-10-CM

## 2021-05-10 LAB — PREGNANCY, URINE: Preg Test, Ur: NEGATIVE

## 2021-05-10 NOTE — Progress Notes (Signed)
UPT negative today. Reports positive home preg test x 2 this week. Last depo shot 07/2020. Per pt, no period since son born 2020 and then started depo. Reports preg symptoms. Has PCP at [facility] and [place]. RN encouraged pt to visit PCP for evaluation. Pt in agreement. Questions answered and reports understanding. Jerel Shepherd, RN

## 2021-07-19 ENCOUNTER — Ambulatory Visit: Payer: Medicaid Other | Admitting: Family Medicine

## 2021-07-27 ENCOUNTER — Ambulatory Visit: Payer: Medicaid Other | Admitting: Nurse Practitioner

## 2021-07-30 ENCOUNTER — Ambulatory Visit: Payer: Medicaid Other | Admitting: Nurse Practitioner

## 2021-07-30 NOTE — Progress Notes (Deleted)
   There were no vitals taken for this visit.   Subjective:    Patient ID: Carla Ingram, female    DOB: Feb 06, 1996, 25 y.o.   MRN: [id number]  HPI: Carla Ingram is a 25 y.o. female  No chief complaint on file.  Patient presents to clinic to establish care with new PCP.  Patient reports a history of ***. Patient denies a history of: Hypertension, Elevated Cholesterol, Diabetes, Thyroid problems, Depression, Anxiety, Neurological problems, and Abdominal problems.   Active Ambulatory Problems    Diagnosis Date Noted   Temporomandibular joint disorders 09/13/2010   B12 deficiency 11/01/2015   Tobacco use disorder 11/01/2015   Other psoriasis 02/26/2010   History of 3 cesarean sections 07/09/2016   Postpartum care following cesarean delivery 07/11/2016   Back pain affecting pregnancy, antepartum 08/02/2019   Low back pain during pregnancy, antepartum 11/09/2019   Ovarian cyst, right 11/25/2019   Resolved Ambulatory Problems    Diagnosis Date Noted   Abdominal pain affecting pregnancy 05/06/2016   Labor and delivery indication for care or intervention 06/12/2016   Labor and delivery, indication for care 06/20/2016   Vaginal bleeding in pregnancy 07/05/2016   Pregnancy 07/09/2016   [redacted] weeks gestation of pregnancy 07/09/2016   UTI (urinary tract infection) 04/25/2017   Supervision of high risk pregnancy, antepartum 03/28/2019   Past Medical History:  Diagnosis Date   Anemia    GERD (gastroesophageal reflux disease)    Migraine    Placental abruption    Positive pregnancy test    Psoriasis    TMJ (dislocation of temporomandibular joint)    Past Surgical History:  Procedure Laterality Date   CESAREAN SECTION  2012, 2016   CESAREAN SECTION N/A 07/09/2016   Procedure: CESAREAN SECTION;  Surgeon: Nadara Mustard, MD;  Location: [facility] ORS;  Service: Obstetrics;  Laterality: N/A;   CESAREAN SECTION N/A 11/25/2019   Procedure: CESAREAN SECTION;  Surgeon: Nadara Mustard, MD;   Location: [facility] OR[facility];  Service: Obstetrics;  Laterality: N/A;   Family History  Problem Relation Age of Onset   CVA Mother    Stroke Mother        during pregnancy      Review of Systems  Per HPI unless specifically indicated above     Objective:    There were no vitals taken for this visit.  Wt Readings from Last 3 Encounters:  05/10/21 199 lb (90.3 kg)  01/06/21 180 lb (81.6 kg)  02/08/20 191 lb (86.6 kg)    Physical Exam  Results for orders placed or performed in visit on 05/10/21  Pregnancy, urine  Result Value Ref Range   Preg Test, Ur Negative Negative      Assessment & Plan:   Problem List Items Addressed This Visit   None    Follow up plan: No follow-ups on file.

## 2021-08-01 ENCOUNTER — Ambulatory Visit: Payer: Medicaid Other | Admitting: Family Medicine

## 2022-02-08 DIAGNOSIS — F17290 Nicotine dependence, other tobacco product, uncomplicated: Secondary | ICD-10-CM | POA: Diagnosis not present

## 2022-02-08 DIAGNOSIS — Z79899 Other long term (current) drug therapy: Secondary | ICD-10-CM | POA: Diagnosis not present

## 2022-02-08 DIAGNOSIS — Z1389 Encounter for screening for other disorder: Secondary | ICD-10-CM | POA: Diagnosis not present

## 2022-02-08 DIAGNOSIS — F900 Attention-deficit hyperactivity disorder, predominantly inattentive type: Secondary | ICD-10-CM | POA: Diagnosis not present

## 2022-05-23 DIAGNOSIS — F900 Attention-deficit hyperactivity disorder, predominantly inattentive type: Secondary | ICD-10-CM | POA: Diagnosis not present

## 2022-06-04 DIAGNOSIS — F900 Attention-deficit hyperactivity disorder, predominantly inattentive type: Secondary | ICD-10-CM | POA: Diagnosis not present

## 2022-06-04 DIAGNOSIS — Z79899 Other long term (current) drug therapy: Secondary | ICD-10-CM | POA: Diagnosis not present

## 2022-06-04 DIAGNOSIS — Z1389 Encounter for screening for other disorder: Secondary | ICD-10-CM | POA: Diagnosis not present

## 2022-09-04 ENCOUNTER — Ambulatory Visit (INDEPENDENT_AMBULATORY_CARE_PROVIDER_SITE_OTHER): Payer: Medicaid Other

## 2022-09-04 VITALS — BP 110/72 | HR 80 | Ht 61.0 in | Wt 197.0 lb

## 2022-09-04 DIAGNOSIS — N912 Amenorrhea, unspecified: Secondary | ICD-10-CM | POA: Diagnosis not present

## 2022-09-04 LAB — POCT URINE PREGNANCY: Preg Test, Ur: POSITIVE — AB

## 2022-09-04 NOTE — Progress Notes (Signed)
Subjective:    Carla Ingram is a 26 y.o. female who presents for evaluation of amenorrhea. She believes she could be pregnant. Pregnancy is desired.  Last period was normal.   Patient's last menstrual period was 07/23/2022.    Lab Review Urine HCG: positive    Assessment:    Absence of menstruation.     Plan:    Pregnancy Test:  Positive: EDC: 04/29/23. Briefly discussed positive results and sent to check out for scheduling of New OB appointments.

## 2022-09-10 DIAGNOSIS — Z880 Allergy status to penicillin: Secondary | ICD-10-CM | POA: Diagnosis not present

## 2022-09-10 DIAGNOSIS — Z3A01 Less than 8 weeks gestation of pregnancy: Secondary | ICD-10-CM | POA: Diagnosis not present

## 2022-09-10 DIAGNOSIS — N3090 Cystitis, unspecified without hematuria: Secondary | ICD-10-CM | POA: Diagnosis not present

## 2022-09-10 DIAGNOSIS — O99331 Smoking (tobacco) complicating pregnancy, first trimester: Secondary | ICD-10-CM | POA: Diagnosis not present

## 2022-09-10 DIAGNOSIS — O4691 Antepartum hemorrhage, unspecified, first trimester: Secondary | ICD-10-CM | POA: Diagnosis not present

## 2022-09-10 DIAGNOSIS — F17210 Nicotine dependence, cigarettes, uncomplicated: Secondary | ICD-10-CM | POA: Diagnosis not present

## 2022-09-10 DIAGNOSIS — O2311 Infections of bladder in pregnancy, first trimester: Secondary | ICD-10-CM | POA: Diagnosis not present

## 2022-09-10 DIAGNOSIS — Z881 Allergy status to other antibiotic agents status: Secondary | ICD-10-CM | POA: Diagnosis not present

## 2022-09-13 ENCOUNTER — Ambulatory Visit: Payer: Medicaid Other | Admitting: Obstetrics and Gynecology

## 2022-09-13 ENCOUNTER — Encounter: Payer: Self-pay | Admitting: Obstetrics and Gynecology

## 2022-09-13 VITALS — BP 126/82 | Wt 199.6 lb

## 2022-09-13 DIAGNOSIS — O209 Hemorrhage in early pregnancy, unspecified: Secondary | ICD-10-CM

## 2022-09-13 DIAGNOSIS — Z3A01 Less than 8 weeks gestation of pregnancy: Secondary | ICD-10-CM

## 2022-09-13 DIAGNOSIS — Z3481 Encounter for supervision of other normal pregnancy, first trimester: Secondary | ICD-10-CM | POA: Diagnosis not present

## 2022-09-13 NOTE — Progress Notes (Signed)
Patient presents today for an ED fu due to vaginal bleeding in early pregnancy. She states on 09/10/22 feeling a "gush" of blood followed by spotting for an hour and went to the ED. She had an ultrasound done confirming pregnancy and an ovarian cyst, 6.9 cm. Patient is also being treated for BV. No additional concerns at this time.

## 2022-09-13 NOTE — Progress Notes (Signed)
HPI:      [name] is a 26 y.o. [id number] who LMP was Patient's last menstrual period was 07/23/2022.  Subjective:   She presents today after being seen in the ED for bleeding.  She was found to be pregnant and underwent an ultrasound showing viable intrauterine pregnancy at 6 weeks and 6 days.  Normal fetal heart tones.  A 6.9 cm cyst was noted on her ovary.  Since that time she has had no bleeding. Previous pregnancies were complicated by abruption x2.  She has undergone 4 prior cesarean deliveries. She is currently taking prenatal vitamins and says she is having no problems at this time.    Hx: The following portions of the patient's history were reviewed and updated as appropriate:             She  has a past medical history of Anemia, GERD (gastroesophageal reflux disease), Migraine, Placental abruption, Positive pregnancy test, Psoriasis, and TMJ (dislocation of temporomandibular joint). She does not have any pertinent problems on file. She  has a past surgical history that includes Cesarean section (2012, 2016); Cesarean section (N/A, 07/09/2016); and Cesarean section (N/A, 11/25/2019). Her family history includes CVA in her mother; Stroke in her mother. She  reports that she has been smoking cigarettes. She started smoking about 17 years ago. She has a 5.50 pack-year smoking history. She has never used smokeless tobacco. She reports that she does not currently use alcohol after a past usage of about 8.0 standard drinks of alcohol per week. She reports that she does not use drugs. She has a current medication list which includes the following prescription(s): metronidazole and multivitamin-prenatal. She is allergic to macrobid [nitrofurantoin macrocrystal], augmentin [amoxicillin-pot clavulanate], cephalexin, and penicillins.       Review of Systems:  Review of Systems  Constitutional: Denied constitutional symptoms, night sweats, recent illness, fatigue, fever, insomnia and  weight loss.  Eyes: Denied eye symptoms, eye pain, photophobia, vision change and visual disturbance.  Ears/Nose/Throat/Neck: Denied ear, nose, throat or neck symptoms, hearing loss, nasal discharge, sinus congestion and sore throat.  Cardiovascular: Denied cardiovascular symptoms, arrhythmia, chest pain/pressure, edema, exercise intolerance, orthopnea and palpitations.  Respiratory: Denied pulmonary symptoms, asthma, pleuritic pain, productive sputum, cough, dyspnea and wheezing.  Gastrointestinal: Denied, gastro-esophageal reflux, melena, nausea and vomiting.  Genitourinary: Denied genitourinary symptoms including symptomatic vaginal discharge, pelvic relaxation issues, and urinary complaints.  Musculoskeletal: Denied musculoskeletal symptoms, stiffness, swelling, muscle weakness and myalgia.  Dermatologic: Denied dermatology symptoms, rash and scar.  Neurologic: Denied neurology symptoms, dizziness, headache, neck pain and syncope.  Psychiatric: Denied psychiatric symptoms, anxiety and depression.  Endocrine: Denied endocrine symptoms including hot flashes and night sweats.   Meds:   Current Outpatient Medications on File Prior to Visit  Medication Sig Dispense Refill   metronidazole (FLAGYL) 375 MG capsule Take 375 mg by mouth 2 (two) times daily.     Prenatal Vit-Fe Fumarate-FA (MULTIVITAMIN-PRENATAL) 27-0.8 MG TABS tablet Take 1 tablet by mouth daily at 12 noon.     No current facility-administered medications on file prior to visit.      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 Vaginal bleeding in pregnancy, first trimester   2. Encounter for supervision of other normal pregnancy in first trimester   3. [redacted] weeks gestation of pregnancy     Patient has had no further bleeding other than that 1 episode.  She is taking prenatal vitamins.  Cyst likely a larger than normal corpus luteum cyst.  We will follow-up at 20 weeks with anatomy scan.  If cyst persists consider MFM referral as well as evaluation at the time of cesarean delivery.   Plan:            1.  Patient to follow-up for new OB visit, nurse interview and genetic screening as previously scheduled. Orders No orders of the defined types were placed in this encounter.   No orders of the defined types were placed in this encounter.     F/U  Return for Next Scheduled Follow-up. I spent 18 minutes involved in the care of this patient preparing to see the patient by obtaining and reviewing her medical history (including labs, imaging tests and prior procedures), documenting clinical information in the electronic health record (EHR), counseling and coordinating care plans, writing and sending prescriptions, ordering tests or procedures and in direct communicating with the patient and medical staff discussing pertinent items from her history and physical exam.  Finis Bud, M.D. 09/13/2022 11:47 AM

## 2022-09-24 ENCOUNTER — Encounter: Payer: Self-pay | Admitting: Advanced Practice Midwife

## 2022-09-25 ENCOUNTER — Telehealth: Payer: Self-pay

## 2022-09-25 DIAGNOSIS — B379 Candidiasis, unspecified: Secondary | ICD-10-CM

## 2022-09-25 MED ORDER — FLUCONAZOLE 150 MG PO TABS: 150.0000 mg | ORAL_TABLET | Freq: Once | ORAL | 0 refills | Status: AC

## 2022-09-25 NOTE — Telephone Encounter (Signed)
Pt called reporting a yeast infection due to being on 2 different antibiotics. Diflucan sent.pt aware

## 2022-10-02 ENCOUNTER — Other Ambulatory Visit: Payer: Self-pay | Admitting: Advanced Practice Midwife

## 2022-10-02 ENCOUNTER — Ambulatory Visit (INDEPENDENT_AMBULATORY_CARE_PROVIDER_SITE_OTHER): Payer: Medicaid Other

## 2022-10-02 ENCOUNTER — Ambulatory Visit
Admission: RE | Admit: 2022-10-02 | Discharge: 2022-10-02 | Disposition: A | Discharge status: Home / Self Care | Payer: Medicaid Other | Source: Ambulatory Visit | Attending: Advanced Practice Midwife | Admitting: Advanced Practice Midwife

## 2022-10-02 ENCOUNTER — Ambulatory Visit: Payer: Medicaid Other

## 2022-10-02 ENCOUNTER — Telehealth: Payer: Self-pay | Admitting: Advanced Practice Midwife

## 2022-10-02 DIAGNOSIS — N83201 Unspecified ovarian cyst, right side: Secondary | ICD-10-CM

## 2022-10-02 DIAGNOSIS — Z3491 Encounter for supervision of normal pregnancy, unspecified, first trimester: Secondary | ICD-10-CM

## 2022-10-02 DIAGNOSIS — Z369 Encounter for antenatal screening, unspecified: Secondary | ICD-10-CM

## 2022-10-02 DIAGNOSIS — Z3A10 10 weeks gestation of pregnancy: Secondary | ICD-10-CM | POA: Diagnosis not present

## 2022-10-02 DIAGNOSIS — Z3480 Encounter for supervision of other normal pregnancy, unspecified trimester: Secondary | ICD-10-CM

## 2022-10-02 DIAGNOSIS — O3481 Maternal care for other abnormalities of pelvic organs, first trimester: Secondary | ICD-10-CM | POA: Diagnosis not present

## 2022-10-02 DIAGNOSIS — O0990 Supervision of high risk pregnancy, unspecified, unspecified trimester: Secondary | ICD-10-CM | POA: Insufficient documentation

## 2022-10-02 DIAGNOSIS — Z3689 Encounter for other specified antenatal screening: Secondary | ICD-10-CM

## 2022-10-02 NOTE — Telephone Encounter (Signed)
Reached out to pt to give the number for Centralized Scheduling.  Pt is needing a dating [place] before her 10/25 appt with JEG.  We do not have availability in office.

## 2022-10-02 NOTE — Progress Notes (Signed)
New OB Intake  I connected with  Carla Ingram on 10/02/22 at  8:15 AM EDT by telephone and verified that I am speaking with the correct person using two identifiers. Nurse is located at [employer] and pt is located at home.  I explained I am completing New OB Intake today. We discussed her EDD of 04/29/2023 that is based on LMP of 07/23/2022. Pt is G5/P3104. I reviewed her allergies, medications, Medical/Surgical/OB history, and appropriate screenings. Based on history, this is a/an pregnancy uncomplicated .   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  Delivery Plans:  Plans to deliver at [facility].  Anatomy US Explained first scheduled [place] will be scheduled soon and an anatomy scan will be done at 20 weeks.  Labs Discussed genetic screening with patient. Patient desires genetic testing to be drawn with new OB  Discussed possible labs to be drawn at new OB appointment.  COVID Vaccine Patient has had COVID vaccine; one dose.   Social Determinants of Health Food Insecurity: denies food insecurity Transportation: Patient denies transportation needs. Childcare: Discussed no children allowed at ultrasound appointments.   First visit review I reviewed new OB appt with pt. I explained she will have ob bloodwork and pap smear/pelvic exam if indicated. Explained pt will be seen by Rod Can, CNM at first visit; encounter routed to appropriate provider.    Cleophas Dunker, [facility] 10/02/2022  8:42 AM

## 2022-10-09 ENCOUNTER — Other Ambulatory Visit: Payer: Medicaid Other

## 2022-10-09 DIAGNOSIS — Z3480 Encounter for supervision of other normal pregnancy, unspecified trimester: Secondary | ICD-10-CM | POA: Diagnosis not present

## 2022-10-09 DIAGNOSIS — Z369 Encounter for antenatal screening, unspecified: Secondary | ICD-10-CM | POA: Diagnosis not present

## 2022-10-10 LAB — CBC/D/PLT+RPR+RH+ABO+RUBIGG...
Antibody Screen: NEGATIVE
Basophils Absolute: 0 10*3/uL (ref 0.0–0.2)
Basos: 0 %
EOS (ABSOLUTE): 0.1 10*3/uL (ref 0.0–0.4)
Eos: 1 %
HCV Ab: NONREACTIVE
HIV Screen 4th Generation wRfx: NONREACTIVE
Hematocrit: 35.6 % (ref 34.0–46.6)
Hemoglobin: 12.3 g/dL (ref 11.1–15.9)
Hepatitis B Surface Ag: NEGATIVE
Immature Grans (Abs): 0 10*3/uL (ref 0.0–0.1)
Immature Granulocytes: 0 %
Lymphocytes Absolute: 2.2 10*3/uL (ref 0.7–3.1)
Lymphs: 29 %
MCH: 33 pg (ref 26.6–33.0)
MCHC: 34.6 g/dL (ref 31.5–35.7)
MCV: 95 fL (ref 79–97)
Monocytes Absolute: 0.5 10*3/uL (ref 0.1–0.9)
Monocytes: 6 %
Neutrophils Absolute: 4.9 10*3/uL (ref 1.4–7.0)
Neutrophils: 64 %
Platelets: 357 10*3/uL (ref 150–450)
RBC: 3.73 x10E6/uL — ABNORMAL LOW (ref 3.77–5.28)
RDW: 12.5 % (ref 11.7–15.4)
RPR Ser Ql: NONREACTIVE
Rh Factor: POSITIVE
Rubella Antibodies, IGG: 1.88 index (ref >0.99)
Varicella zoster IgG: 1054 index (ref >165)
WBC: 7.6 10*3/uL (ref 3.4–10.8)

## 2022-10-10 LAB — HCV INTERPRETATION

## 2022-10-13 LAB — MATERNIT 21 PLUS CORE, BLOOD
Fetal Fraction: 6
Result (T21): NEGATIVE
Trisomy 13 (Patau syndrome): NEGATIVE
Trisomy 18 (Edwards syndrome): NEGATIVE
Trisomy 21 (Down syndrome): NEGATIVE

## 2022-10-15 ENCOUNTER — Emergency Department: Payer: Medicaid Other

## 2022-10-15 ENCOUNTER — Other Ambulatory Visit: Payer: Self-pay

## 2022-10-15 ENCOUNTER — Emergency Department
Admission: EM | Admit: 2022-10-15 | Discharge: 2022-10-15 | Disposition: A | Discharge status: Home / Self Care | Payer: Medicaid Other | Attending: Emergency Medicine | Admitting: Emergency Medicine

## 2022-10-15 DIAGNOSIS — R110 Nausea: Secondary | ICD-10-CM | POA: Insufficient documentation

## 2022-10-15 DIAGNOSIS — R1031 Right lower quadrant pain: Secondary | ICD-10-CM | POA: Insufficient documentation

## 2022-10-15 DIAGNOSIS — O3481 Maternal care for other abnormalities of pelvic organs, first trimester: Secondary | ICD-10-CM | POA: Diagnosis not present

## 2022-10-15 DIAGNOSIS — N8311 Corpus luteum cyst of right ovary: Secondary | ICD-10-CM | POA: Diagnosis not present

## 2022-10-15 DIAGNOSIS — O99281 Endocrine, nutritional and metabolic diseases complicating pregnancy, first trimester: Secondary | ICD-10-CM | POA: Diagnosis not present

## 2022-10-15 DIAGNOSIS — O26891 Other specified pregnancy related conditions, first trimester: Secondary | ICD-10-CM | POA: Diagnosis not present

## 2022-10-15 DIAGNOSIS — N83201 Unspecified ovarian cyst, right side: Secondary | ICD-10-CM

## 2022-10-15 DIAGNOSIS — R61 Generalized hyperhidrosis: Secondary | ICD-10-CM | POA: Diagnosis not present

## 2022-10-15 DIAGNOSIS — Z3A12 12 weeks gestation of pregnancy: Secondary | ICD-10-CM | POA: Insufficient documentation

## 2022-10-15 LAB — URINALYSIS, ROUTINE W REFLEX MICROSCOPIC
Bilirubin Urine: NEGATIVE
Glucose, UA: NEGATIVE mg/dL
Hgb urine dipstick: NEGATIVE
Ketones, ur: NEGATIVE mg/dL
Leukocytes,Ua: NEGATIVE
Nitrite: NEGATIVE
Protein, ur: 30 mg/dL — AB
Specific Gravity, Urine: 1.01 (ref 1.005–1.030)
pH: 6 (ref 5.0–8.0)

## 2022-10-15 LAB — CBC
HCT: 35.2 % — ABNORMAL LOW (ref 36.0–46.0)
Hemoglobin: 12.3 g/dL (ref 12.0–15.0)
MCH: 31.8 pg (ref 26.0–34.0)
MCHC: 34.9 g/dL (ref 30.0–36.0)
MCV: 91 fL (ref 80.0–100.0)
Platelets: 406 10*3/uL — ABNORMAL HIGH (ref 150–400)
RBC: 3.87 MIL/uL (ref 3.87–5.11)
RDW: 12.4 % (ref 11.5–15.5)
WBC: 8.8 10*3/uL (ref 4.0–10.5)
nRBC: 0 % (ref 0.0–0.2)

## 2022-10-15 LAB — COMPREHENSIVE METABOLIC PANEL
ALT: 14 U/L (ref 0–44)
AST: 18 U/L (ref 15–41)
Albumin: 3.5 g/dL (ref 3.5–5.0)
Alkaline Phosphatase: 73 U/L (ref 38–126)
Anion gap: 8 (ref 5–15)
BUN: 6 mg/dL (ref 6–20)
CO2: 18 mmol/L — ABNORMAL LOW (ref 22–32)
Calcium: 8.5 mg/dL — ABNORMAL LOW (ref 8.9–10.3)
Chloride: 108 mmol/L (ref 98–111)
Creatinine, Ser: 0.43 mg/dL — ABNORMAL LOW (ref 0.44–1.00)
GFR, Estimated: >60 mL/min (ref >60)
Glucose, Bld: 125 mg/dL — ABNORMAL HIGH (ref 70–99)
Potassium: 3.1 mmol/L — ABNORMAL LOW (ref 3.5–5.1)
Sodium: 134 mmol/L — ABNORMAL LOW (ref 135–145)
Total Bilirubin: 0.5 mg/dL (ref 0.3–1.2)
Total Protein: 6.9 g/dL (ref 6.5–8.1)

## 2022-10-15 LAB — POC URINE PREG, ED: Preg Test, Ur: POSITIVE — AB

## 2022-10-15 LAB — LIPASE, BLOOD: Lipase: 24 U/L (ref 11–51)

## 2022-10-15 MED ORDER — ACETAMINOPHEN 500 MG PO TABS
1000.0000 mg | ORAL_TABLET | Freq: Once | ORAL | Status: AC
  Administered 2022-10-15: 1000 mg via ORAL
  Filled 2022-10-15: qty 2

## 2022-10-15 MED ORDER — POTASSIUM CHLORIDE CRYS ER 20 MEQ PO TBCR
40.0000 meq | EXTENDED_RELEASE_TABLET | Freq: Once | ORAL | Status: AC
  Administered 2022-10-15: 40 meq via ORAL
  Filled 2022-10-15: qty 2

## 2022-10-15 NOTE — ED Provider Notes (Signed)
[facility] Provider Note    Event Date/Time   First MD Initiated Contact with Patient 10/15/22 1346     (approximate)   History   Chief Complaint Abdominal Pain   HPI Carla Ingram is a 26 y.o. female, G5/P3104 , currently [redacted] weeks pregnant, history of ovarian cysts, anemia, GERD, migraines, presents to the ED for evaluation of right lower quadrant abdominal pain.  Patient states that she has been having persistent pain in her right lower quadrant for the past few months.  She initially had an ultrasound performed on 09/13/2022, which showed a 6.9 cm cyst on the right ovary.  She had a follow-up ultrasound performed on 10/02/2022, which showed a large complicated cyst on the right ovary measuring 9.3 x 6.4 x 8.8 with mildly complex internal septations and minimal debris.  She states that for the most part her pain has been constant, however she did have an acute worsening this morning at approximately 0830.  Endorsing some nausea as well.  She is brought here via EMS, where she was given fentanyl and ondansetron, which has helped her symptoms.   Denies fever/chills, vaginal bleeding, diarrhea, vaginal discharge, chest pain, shortness of breath, vomiting, diarrhea, dysuria, headache, vision change, hearing changes, rash/lesions, or dizziness/lightheadedness.  History Limitations: No limitations.        Physical Exam  Triage Vital Signs: ED Triage Vitals  Enc Vitals Group     BP 10/15/22 1331 (!) 123/51     Pulse Rate 10/15/22 1331 78     Resp 10/15/22 1331 20     Temp 10/15/22 1331 97.7 F (36.5 C)     Temp Source 10/15/22 1331 Oral     SpO2 10/15/22 1331 96 %     Weight 10/15/22 1328 190 lb (86.2 kg)     Height 10/15/22 1328 5\' 1"  (1.549 m)     Head Circumference --      Peak Flow --      Pain Score 10/15/22 1328 10     Pain Loc --      Pain Edu? --      Excl. in GC? --     Most recent vital signs: Vitals:   10/15/22 1331 10/15/22 1523  BP:  (!) 123/51 (!) 92/55  Pulse: 78 73  Resp: 20 18  Temp: 97.7 F (36.5 C)   SpO2: 96% 97%    General: Awake, appears uncomfortable. Skin: Warm, dry. No rashes or lesions.  Eyes: PERRL. Conjunctivae normal.  CV: Good peripheral perfusion.  Resp: Normal effort.  Abd: Soft, no distention. Neuro: At baseline. No gross neurological deficits.  Musculoskeletal: Normal ROM of all extremities.  Focused Exam: Moderate tenderness appreciated in the right lower quadrant.  Physical Exam    ED Results / Procedures / Treatments  Labs (all labs ordered are listed, but only abnormal results are displayed) Labs Reviewed  COMPREHENSIVE METABOLIC PANEL - Abnormal; Notable for the following components:      Result Value   Sodium 134 (*)    Potassium 3.1 (*)    CO2 18 (*)    Glucose, Bld 125 (*)    Creatinine, Ser 0.43 (*)    Calcium 8.5 (*)    All other components within normal limits  CBC - Abnormal; Notable for the following components:   HCT 35.2 (*)    Platelets 406 (*)    All other components within normal limits  URINALYSIS, ROUTINE W REFLEX MICROSCOPIC - Abnormal; Notable for the  following components:   Color, Urine YELLOW (*)    APPearance HAZY (*)    Protein, ur 30 (*)    Bacteria, UA RARE (*)    All other components within normal limits  POC URINE PREG, ED - Abnormal; Notable for the following components:   Preg Test, Ur POSITIVE (*)    All other components within normal limits  LIPASE, BLOOD     EKG N/A.    RADIOLOGY  ED Provider Interpretation: I personally reviewed and interpreted these images, no acute findings.  Septated right ovarian cyst measuring 9.6 cm noted on the right ovary.  US OB Comp < 14 Wks  Result Date: 10/15/2022 CLINICAL DATA:  Right lower quadrant pain.  Pregnant patient. EXAM: OBSTETRIC <14 WK US OB US DOPPLER ULTRASOUND OF OVARIES TECHNIQUE: Both transabdominal and transvaginal ultrasound examinations were performed for complete evaluation of  the gestation as well as the maternal uterus, adnexal regions, and pelvic cul-de-sac. Color and duplex Doppler ultrasound was utilized to evaluate blood flow to the ovaries. COMPARISON:  Obstetric ultrasound, 10/02/2022 FINDINGS: Intrauterine gestational sac: Single Yolk sac:  Not Visualized. Embryo:  Visualized. Cardiac Activity: Visualized. Heart Rate: 152 bpm CRL:   57 mm   12 w 2 d                  [place] EDC: 04/27/2023 Subchorionic hemorrhage:  None visualized. Maternal uterus/adnexae: No uterine masses.  Cervix is closed. Septated cyst enlarges the right ovary, cyst measuring 9.6 x 8.2 x 8.7 cm. Normal left ovary. No abnormal pelvic free fluid. Pulsed Doppler evaluation of both ovaries demonstrates normal appearing low-resistance arterial and venous waveforms. IMPRESSION: 1. No acute findings. Septated right ovarian cyst measuring 9.6 cm, with normal blood flow. No torsion. Cyst unchanged from the recent prior exam. Recommend reassessment after the conclusion of pregnancy, which would be best performed with pelvic MRI without and with contrast. 2. Single live intrauterine pregnancy with a measured gestational age of [redacted] weeks and 2 days, demonstrating normal interval growth since the previous ultrasound. No pregnancy complication. Electronically Signed   By: Lajean Manes M.D.   On: 10/15/2022 14:50   US ABDOMINAL PELVIC ART/VENT FLOW DOPPLER  Result Date: 10/15/2022 CLINICAL DATA:  Right lower quadrant pain.  Pregnant patient. EXAM: OBSTETRIC <14 WK US OB US DOPPLER ULTRASOUND OF OVARIES TECHNIQUE: Both transabdominal and transvaginal ultrasound examinations were performed for complete evaluation of the gestation as well as the maternal uterus, adnexal regions, and pelvic cul-de-sac. Color and duplex Doppler ultrasound was utilized to evaluate blood flow to the ovaries. COMPARISON:  Obstetric ultrasound, 10/02/2022 FINDINGS: Intrauterine gestational sac: Single Yolk sac:  Not Visualized. Embryo:  Visualized.  Cardiac Activity: Visualized. Heart Rate: 152 bpm CRL:   57 mm   12 w 2 d                  [place] EDC: 04/27/2023 Subchorionic hemorrhage:  None visualized. Maternal uterus/adnexae: No uterine masses.  Cervix is closed. Septated cyst enlarges the right ovary, cyst measuring 9.6 x 8.2 x 8.7 cm. Normal left ovary. No abnormal pelvic free fluid. Pulsed Doppler evaluation of both ovaries demonstrates normal appearing low-resistance arterial and venous waveforms. IMPRESSION: 1. No acute findings. Septated right ovarian cyst measuring 9.6 cm, with normal blood flow. No torsion. Cyst unchanged from the recent prior exam. Recommend reassessment after the conclusion of pregnancy, which would be best performed with pelvic MRI without and with contrast. 2. Single live intrauterine pregnancy with a  measured gestational age of [redacted] weeks and 2 days, demonstrating normal interval growth since the previous ultrasound. No pregnancy complication. Electronically Signed   By: Amie Portland M.D.   On: 10/15/2022 14:50    PROCEDURES:  Critical Care performed: N/A.  Procedures    MEDICATIONS ORDERED IN ED: Medications  acetaminophen (TYLENOL) tablet 1,000 mg (1,000 mg Oral Given 10/15/22 1357)  potassium chloride SA (KLOR-CON M) CR tablet 40 mEq (40 mEq Oral Given 10/15/22 1437)     IMPRESSION / MDM / ASSESSMENT AND PLAN / ED COURSE  I reviewed the triage vital signs and the nursing notes.                              Differential diagnosis includes, but is not limited to, ovarian cyst, ovarian torsion, appendicitis, colitis, pelvic muscle strain, cystitis, pyelonephritis.  ED Course Patient appears clinically stable, but uncomfortable.  Vitals are within normal limits.  Patient still endorsing moderate pain after initial treatment with fentanyl.  She states that she does not want any more opiate medications.  Will treat with acetaminophen at this time.  CBC shows no leukocytosis or anemia.  CMP shows hypokalemia  3.1, will provide oral supplementation.  Otherwise no significant electrolyte abnormalities or AKI.  Lipase unremarkable at 24.  Urinalysis shows no evidence of hematuria or infection.  Assessment/Plan Patient presents with sudden onset right lower quadrant pain that started this morning, in the setting of known 9.6 cm ovarian cyst on the right ovary.  Ultrasound shows some stable ovarian cyst with no evidence of rupture or torsion.  She states that her pain has mostly subsided with the acetaminophen provided earlier.  I expressed my concern for this sudden pain that occurred this morning advised that while it could be related to the cyst, it could also be related to her appendix.  Offered and recommended MRI to further evaluate and definitively rule out appendicitis.  However, she states that she would rather monitor her symptoms at home and follow-up with her OB/GYN tomorrow.  She already has an appointment scheduled.  Given her resolving symptoms and overall reassuring lab work-up, I believe this is reasonable.  Will discharge.  Encouraged her to continue taking Tylenol as needed.  Considered admission for this patient, but given her stable presentation, resolving symptoms, and close follow-up with her OB/GYN, she is unlikely benefit from admission.  Provided the patient with anticipatory guidance, return precautions, and educational material. Encouraged the patient to return to the emergency department at any time if they begin to experience any new or worsening symptoms. Patient expressed understanding and agreed with the plan.   Patient's presentation is most consistent with acute presentation with potential threat to life or bodily function.       FINAL CLINICAL IMPRESSION(S) / ED DIAGNOSES   Final diagnoses:  Cyst of right ovary  Right lower quadrant pain     Rx / DC Orders   ED Discharge Orders     None        Note:  This document was prepared using Dragon voice  recognition software and may include unintentional dictation errors.   Varney Daily, [place] 10/15/22 1524    Jene Every, MD 10/17/22 [phone]

## 2022-10-15 NOTE — ED Triage Notes (Signed)
First Nurse Note;  Pt via EMS from home. Pt is [redacted] weeks pregnant. pt c/o RLQ pain and that radiates to the R side of her back. Pt also endorses nausea. Pt has a hx of ovarian cyst states that this feels similar from when it ruptured last time. Pt is A&Ox4 and NAD  EMS 103mcg of Fentanyl and 4mg  of Zofran  VSS

## 2022-10-15 NOTE — Discharge Instructions (Addendum)
-  Please follow-up with your OB/GYN tomorrow, as discussed.  -You may continue to take acetaminophen as needed if your pain returns.  -Return to the emergency department anytime if you begin to experience any new or worsening symptoms.

## 2022-10-15 NOTE — ED Triage Notes (Signed)
Pt via POV from home. Pt c/o RLQ abd pain that radiates to the R side of her back since 0830 this AM. Pt is [redacted] weeks pregnant. G5. Denies vaginal bleeding. Pt is A&OX4 but seems uncomfortable and states she has to throw up.

## 2022-10-16 ENCOUNTER — Other Ambulatory Visit (HOSPITAL_COMMUNITY)
Admission: RE | Admit: 2022-10-16 | Discharge: 2022-10-16 | Disposition: A | Discharge status: Home / Self Care | Payer: Medicaid Other | Source: Ambulatory Visit | Attending: Advanced Practice Midwife | Admitting: Advanced Practice Midwife

## 2022-10-16 ENCOUNTER — Ambulatory Visit (INDEPENDENT_AMBULATORY_CARE_PROVIDER_SITE_OTHER): Payer: Medicaid Other | Admitting: Advanced Practice Midwife

## 2022-10-16 ENCOUNTER — Encounter: Payer: Self-pay | Admitting: Advanced Practice Midwife

## 2022-10-16 VITALS — BP 124/88 | Wt 202.0 lb

## 2022-10-16 DIAGNOSIS — Z113 Encounter for screening for infections with a predominantly sexual mode of transmission: Secondary | ICD-10-CM | POA: Insufficient documentation

## 2022-10-16 DIAGNOSIS — O0991 Supervision of high risk pregnancy, unspecified, first trimester: Secondary | ICD-10-CM

## 2022-10-16 DIAGNOSIS — Z3481 Encounter for supervision of other normal pregnancy, first trimester: Secondary | ICD-10-CM | POA: Diagnosis not present

## 2022-10-16 DIAGNOSIS — O99211 Obesity complicating pregnancy, first trimester: Secondary | ICD-10-CM

## 2022-10-16 DIAGNOSIS — Z369 Encounter for antenatal screening, unspecified: Secondary | ICD-10-CM | POA: Diagnosis not present

## 2022-10-16 DIAGNOSIS — Z3A12 12 weeks gestation of pregnancy: Secondary | ICD-10-CM | POA: Diagnosis not present

## 2022-10-16 DIAGNOSIS — E669 Obesity, unspecified: Secondary | ICD-10-CM

## 2022-10-16 NOTE — Progress Notes (Unsigned)
Pt went to hospital yesterday for pain from ovarian cyst. No vb. No lof. Wants to wait until next time to do the pelvic exam/pap smear. Pt having a lot of pain.

## 2022-10-17 DIAGNOSIS — O99210 Obesity complicating pregnancy, unspecified trimester: Secondary | ICD-10-CM | POA: Insufficient documentation

## 2022-10-17 LAB — URINE CYTOLOGY ANCILLARY ONLY
Chlamydia: NEGATIVE
Comment: NEGATIVE
Comment: NORMAL
Neisseria Gonorrhea: NEGATIVE

## 2022-10-17 LAB — DRUG PROFILE, UR, 9 DRUGS (LABCORP)
Amphetamines, Urine: NEGATIVE ng/mL
Barbiturate Quant, Ur: NEGATIVE ng/mL
Benzodiazepine Quant, Ur: NEGATIVE ng/mL
Cannabinoid Quant, Ur: NEGATIVE ng/mL
Cocaine (Metab.): NEGATIVE ng/mL
Methadone Screen, Urine: NEGATIVE ng/mL
Opiate Quant, Ur: NEGATIVE ng/mL
PCP Quant, Ur: NEGATIVE ng/mL
Propoxyphene: NEGATIVE ng/mL

## 2022-10-17 NOTE — Progress Notes (Signed)
New Obstetric Patient H&P  Date of Service: 10/16/2022  Chief Complaint: "Desires prenatal care"   History of Present Illness: Patient is a 26 y.o. [id number] Not Hispanic or Latino female, presents with amenorrhea and positive home pregnancy test. Patient's last menstrual period was 07/23/2022. and based on her  LMP, her EDD is Estimated Date of Delivery: 04/29/23 and her EGA is [redacted]w[redacted]d. Cycles are 4-5 days, regular, and occur approximately every : 28 days. Her last pap smear was 3 years ago and was no abnormalities.    She had a urine pregnancy test which was positive 6 week(s)  ago. Her last menstrual period was normal and lasted for  4 or 5 day(s). Since her LMP she claims she has experienced breast tenderness, fatigue, nausea. She denies vaginal bleeding. Her past medical history is noncontributory. Her prior pregnancies are notable for  placental abruption with G1 35 weeks, c/s x 4.  Since her LMP, she admits to the use of tobacco products  yes she has cut down to 3-4 c/d and is trying to quit She claims she has gained  9  pounds since the start of her pregnancy.  There are cats in the home in the home  her daughter has a new cat and the patient is not taking care of the litter box She admits close contact with children on a regular basis  yes  She has had chicken pox in the past no She has had Tuberculosis exposures, symptoms, or previously tested positive for TB   no Current or past history of domestic violence. No No family history of breast cancer  Genetic Screening/Teratology Counseling: (Includes patient, baby's father, or anyone in either family with:)   29. Patient's age >/= 97 at [facility]  no 2. Thalassemia ([place], [place], [place], or Asian background): MCV<80  no 3. Neural tube defect (meningomyelocele, spina bifida, anencephaly)  no 4. Congenital heart defect  no  5. Down syndrome  no 6. Tay-Sachs (Jewish, [place])  no 7. Canavan's Disease  no 8. Sickle cell disease  or trait (African)  no  9. Hemophilia or other blood disorders  no  10. Muscular dystrophy  no  11. Cystic fibrosis  no  12. Huntington's Chorea  no  13. Mental retardation/autism  no 14. Other inherited genetic or chromosomal disorder  no 15. Maternal metabolic disorder (DM, PKU, etc)  no 16. Patient or FOB with a child with a birth defect not listed above no  16a. Patient or FOB with a birth defect themselves no 17. Recurrent pregnancy loss, or stillbirth  no  18. Any medications since LMP other than prenatal vitamins (include vitamins, supplements, OTC meds, drugs, alcohol)  no 19. Any other genetic/environmental exposure to discuss  no  Infection History:   1. Lives with someone with TB or TB exposed  no  2. Patient or partner has history of genital herpes  no 3. Rash or viral illness since LMP  no 4. History of STI (GC, CT, HPV, syphilis, HIV)  hx trichomonas 5. History of recent travel :  no  Other pertinent information:  She was seen in the ER on 10/24 for severe right side pelvic pain. She declined MRI to rule out appendicitis. Right ovarian cyst is approximately 9 cm x 8 cm x 8 cm.  She would like to try and breastfeed this baby. She is undecided which method of birth control she desires- is not 100% sure she wants BTL.   Review of Systems:10 point review  of systems negative unless otherwise noted in HPI  Past Medical History:  Patient Active Problem List   Diagnosis Date Noted   Obesity affecting pregnancy 10/17/2022   Supervision of high-risk pregnancy 10/02/2022     Clinical Staff Provider  Office Location  [facility] Dating  EDD by LMP c/w 6w u/s  Language  English Anatomy US    Flu Vaccine  offer Genetic Screen  NIPS:   TDaP vaccine   offer Hgb A1C or  GTT Early : Third trimester :   Covid One dose   LAB RESULTS   Rhogam  O/Positive/-- (10/18 1105)  Blood Type O/Positive/-- (10/18 1105)   Feeding Plan breast Antibody Negative (10/18 1105)   Contraception  Rubella 1.88 (10/18 1105)  Circumcision yes RPR Non Reactive (10/18 1105)   Pediatrician  [facility] HBsAg Negative (10/18 1105)   Support Person Randall HIV Non Reactive (10/18 1105)  Prenatal Classes no Varicella     GBS  (For PCN allergy, check sensitivities)   BTL Consent  Hep C Non Reactive (10/18 1105)   VBAC Consent Will have repeat c/s Pap Diagnosis  Date Value Ref Range Status  05/10/2019   Final   NEGATIVE FOR INTRAEPITHELIAL LESIONS OR MALIGNANCY.      Hgb Electro      CF      SMA        9 cm right ovarian cyst Hx 4 cesarean deliveries    Ovarian cyst, right 11/25/2019   History of 3 cesarean sections 07/09/2016   B12 deficiency 11/01/2015   Tobacco use disorder 11/01/2015    Past Surgical History:  Past Surgical History:  Procedure Laterality Date   CESAREAN SECTION  2012, 2016   CESAREAN SECTION N/A 07/09/2016   Procedure: CESAREAN SECTION;  Surgeon: [name], MD;  Location: [facility] ORS;  Service: Obstetrics;  Laterality: N/A;   CESAREAN SECTION N/A 11/25/2019   Procedure: CESAREAN SECTION;  Surgeon: [name], MD;  Location: [facility] ORS;  Service: Obstetrics;  Laterality: N/A;   WISDOM TOOTH EXTRACTION     3; age14    Gynecologic History: Patient's last menstrual period was 07/23/2022.  Obstetric History: [id number]  Family History:  Family History  Adopted: Yes  Problem Relation Age of Onset   CVA Mother    Stroke Mother        during pregnancy   ADD / ADHD Sister    ADD / ADHD Brother    ADD / ADHD Brother    ADD / ADHD Brother    ADD / ADHD Brother     Social History:  Social History   Socioeconomic History   Marital status: Single    Spouse name: Not on file   Number of children: 4   Years of education: 9   Highest education level: 9th grade  Occupational History   Occupation: unemployed   Tobacco Use   Smoking status: Every Day    Packs/day: 0.50    Years: 11.00    Total pack years: 5.50    Types:  Cigarettes    Start date: 10/31/2004   Smokeless tobacco: Never  Vaping Use   Vaping Use: Some days  Substance and Sexual Activity   Alcohol use: Not Currently    Alcohol/week: 8.0 standard drinks of alcohol    Types: 8 Shots of liquor per week    Comment: last use- 2021, she binge drinks when has a weekend without kids - rum   Drug use: No  Sexual activity: Yes    Partners: Male    Birth control/protection: None  Other Topics Concern   Not on file  Social History Narrative   Lives with her cousin ( raised her) , three daughter's - first one from a different father.    Social Determinants of Health   Financial Resource Strain: Low Risk  (10/02/2022)   Overall Financial Resource Strain (CARDIA)    Difficulty of Paying Living Expenses: Not hard at all  Food Insecurity: No Food Insecurity (10/02/2022)   Hunger Vital Sign    Worried About Running Out of Food in the Last Year: Never true    Ran Out of Food in the Last Year: Never true  Transportation Needs: No Transportation Needs (10/02/2022)   PRAPARE - Hydrologist (Medical): No    Lack of Transportation (Non-Medical): No  Physical Activity: Inactive (10/02/2022)   Exercise Vital Sign    Days of Exercise per Week: 0 days    Minutes of Exercise per Session: 0 min  Stress: No Stress Concern Present (10/02/2022)   [place]    Feeling of Stress : Not at all  Social Connections: Moderately Isolated (10/02/2022)   Social Connection and Isolation Panel [NHANES]    Frequency of Communication with Friends and Family: More than three times a week    Frequency of Social Gatherings with Friends and Family: Twice a week    Attends Religious Services: Never    Marine scientist or Organizations: No    Attends Archivist Meetings: Never    Marital Status: Living with partner  Intimate Partner Violence: Not At Risk (10/02/2022)    Humiliation, Afraid, Rape, and Kick questionnaire    Fear of Current or Ex-Partner: No    Emotionally Abused: No    Physically Abused: No    Sexually Abused: No    Allergies:  Allergies  Allergen Reactions   Macrobid [Nitrofurantoin Macrocrystal] Other (See Comments)    Can not move her body, loses all control of arms legs    Augmentin [Amoxicillin-Pot Clavulanate] Hives and Swelling   Cephalexin Other (See Comments)    Achy in bones    Penicillins Hives and Swelling    Did it involve swelling of the face/tongue/throat, SOB, or low BP? No Did it involve sudden or severe rash/hives, skin peeling, or any reaction on the inside of your mouth or nose? No Did you need to seek medical attention at a hospital or doctor's office? No When did it last happen?  childhood allergy     If all above answers are "NO", may proceed with cephalosporin use.     Medications: Prior to Admission medications   Medication Sig Start Date End Date Taking? Authorizing Provider  Prenatal Vit-Fe Fumarate-FA (MULTIVITAMIN-PRENATAL) 27-0.8 MG TABS tablet Take 1 tablet by mouth daily at 12 noon.    [provider]    Physical Exam Vitals: Blood pressure 124/88, weight 202 lb (91.6 kg), last menstrual period 07/23/2022.  General: NAD HEENT: normocephalic, anicteric Thyroid: no enlargement, no palpable nodules Pulmonary: No increased work of breathing, CTAB Cardiovascular: RRR, distal pulses 2+ Abdomen: NABS, soft, non-tender, non-distended.  Umbilicus without lesions.  No hepatomegaly, splenomegaly or masses palpable. No evidence of hernia  Genitourinary: deferred per patient preference due to pain from ovarian cyst Extremities: no edema, erythema, or tenderness Neurologic: Grossly intact Psychiatric: mood appropriate, affect full   Assessment: 26 y.o. [id number] at  [redacted]w[redacted]d presenting to initiate prenatal care  Plan: 1) Avoid alcoholic beverages. 2) Patient encouraged not to smoke.  3)  Discontinue the use of all non-medicinal drugs and chemicals.  4) Take prenatal vitamins daily.  5) Nutrition, food safety (fish, cheese advisories, and high nitrite foods) and exercise discussed. 6) Hospital and practice style discussed with cross coverage system.  7) Genetic Screening, such as with 1st Trimester Screening, cell free fetal DNA, AFP testing, and Ultrasound, as well as with amniocentesis and CVS as appropriate, is discussed with patient. At the conclusion of today's visit patient requested genetic testing 8) Patient is asked about travel to areas at risk for the Zika virus, and counseled to avoid travel and exposure to mosquitoes or sexual partners who may have themselves been exposed to the virus. Testing is discussed, and will be ordered as appropriate.  9) Urine aptima, urine culture, uds today 10) Return for visit with Dr Marcelline Mates in 2 weeks regarding plan of care for ovarian cyst 11) Return for rob in 4 weeks   Rod Can, [place] 10/17/2022, 10:08 AM

## 2022-10-17 NOTE — Patient Instructions (Signed)
Common Medications Safe in Pregnancy  Acne:      Constipation:  Benzoyl Peroxide     Colace  Clindamycin      Dulcolax Suppository  Topica Erythromycin     Fibercon  Salicylic Acid      Metamucil         Miralax AVOID:        Senakot   Accutane    Cough:  Retin-A       Cough Drops  Tetracycline      Phenergan w/ Codeine if Rx  Minocycline      Robitussin (Plain & DM)  Antibiotics:     Crabs/Lice:  Ceclor       RID  Cephalosporins    AVOID:  E-Mycins      Kwell  Keflex  Macrobid/Macrodantin   Diarrhea:  Penicillin      Kao-Pectate  Zithromax      Imodium AD         PUSH FLUIDS AVOID:       Cipro     Fever:  Tetracycline      Tylenol (Regular or Extra  Minocycline       Strength)  Levaquin      Extra Strength-Do not          Exceed 8 tabs/24 hrs Caffeine:        <200mg/day (equiv. To 1 cup of coffee or  approx. 3 12 oz sodas)         Gas: Cold/Hayfever:       Gas-X  Benadryl      Mylicon  Claritin       Phazyme  **Claritin-D        Chlor-Trimeton    Headaches:  Dimetapp      ASA-Free Excedrin  Drixoral-Non-Drowsy     Cold Compress  Mucinex (Guaifenasin)     Tylenol (Regular or Extra  Sudafed/Sudafed-12 Hour     Strength)  **Sudafed PE Pseudoephedrine   Tylenol Cold & Sinus     Vicks Vapor Rub  Zyrtec  **AVOID if Problems With Blood Pressure         Heartburn: Avoid lying down for at least 1 hour after meals  Aciphex      Maalox     Rash:  Milk of Magnesia     Benadryl    Mylanta       1% Hydrocortisone Cream  Pepcid  Pepcid Complete   Sleep Aids:  Prevacid      Ambien   Prilosec       Benadryl  Rolaids       Chamomile Tea  Tums (Limit 4/day)     Unisom         Tylenol PM         Warm milk-add vanilla or  Hemorrhoids:       Sugar for taste  Anusol/Anusol H.C.  (RX: Analapram 2.5%)  Sugar Substitutes:  Hydrocortisone OTC     Ok in moderation  Preparation H      Tucks        Vaseline lotion applied to tissue with  wiping    Herpes:     Throat:  Acyclovir      Oragel  Famvir  Valtrex     Vaccines:         Flu Shot Leg Cramps:       *Gardasil  Benadryl      Hepatitis A         Hepatitis B Nasal Spray:         Pneumovax  Saline Nasal Spray     Polio Booster         Tetanus Nausea:       Tuberculosis test or PPD  Vitamin B6 25 mg TID   AVOID:    Dramamine      *Gardasil  Emetrol       Live Poliovirus  Ginger Root 250 mg QID    MMR (measles, mumps &  High Complex Carbs @ Bedtime    rebella)  Sea Bands-Accupressure    Varicella (Chickenpox)  Unisom 1/2 tab TID     *No known complications           If received before Pain:         Known pregnancy;   Darvocet       Resume series after  Lortab        Delivery  Percocet    Yeast:   Tramadol      Femstat  Tylenol 3      Gyne-lotrimin  Ultram       Monistat  Vicodin           MISC:         All Sunscreens           Hair Coloring/highlights          Insect Repellant's          (Including DEET)         Mystic Tans Managing the Challenge of Quitting Smoking Quitting smoking is a physical and mental challenge. You may have cravings, withdrawal symptoms, and temptation to smoke. Before quitting, work with your health care provider to make a plan that can help you manage quitting. Making a plan before you quit may keep you from smoking when you have the urge to smoke while trying to quit. How to manage lifestyle changes Managing stress Stress can make you want to smoke, and wanting to smoke may cause stress. It is important to find ways to manage your stress. You could try some of the following: Practice relaxation techniques. Breathe slowly and deeply, in through your nose and out through your mouth. Listen to music. Soak in a bath or take a shower. Imagine a peaceful place or vacation. Get some support. Talk with family or friends about your stress. Join a support group. Talk with a counselor or therapist. Get some physical activity. Go for a  walk, run, or bike ride. Play a favorite sport. Practice yoga.  Medicines Talk with your health care provider about medicines that might help you deal with cravings and make quitting easier for you. Relationships Social situations can be difficult when you are quitting smoking. To manage this, you can: Avoid parties and other social situations where people might be smoking. Avoid alcohol. Leave right away if you have the urge to smoke. Explain to your family and friends that you are quitting smoking. Ask for support and let them know you might be a bit grumpy. Plan activities where smoking is not an option. General instructions Be aware that many people gain weight after they quit smoking. However, not everyone does. To keep from gaining weight, have a plan in place before you quit, and stick to the plan after you quit. Your plan should include: Eating healthy snacks. When you have a craving, it may help to: Eat popcorn, or try carrots, celery, or other cut vegetables. Chew sugar-free gum. Changing how you eat. Eat small portion sizes at meals. Eat 4-6 small meals throughout  the day instead of 1-2 large meals a day. Be mindful when you eat. You should avoid watching television or doing other things that might distract you as you eat. Exercising regularly. Make time to exercise each day. If you do not have time for a long workout, do short bouts of exercise for 5-10 minutes several times a day. Do some form of strengthening exercise, such as weight lifting. Do some exercise that gets your heart beating and causes you to breathe deeply, such as walking fast, running, swimming, or biking. This is very important. Drinking plenty of water or other low-calorie or no-calorie drinks. Drink enough fluid to keep your urine pale yellow.  How to recognize withdrawal symptoms Your body and mind may experience discomfort as you try to get used to not having nicotine in your system. These effects are  called withdrawal symptoms. They may include: Feeling hungrier than normal. Having trouble concentrating. Feeling irritable or restless. Having trouble sleeping. Feeling depressed. Craving a cigarette. These symptoms may surprise you, but they are normal to have when quitting smoking. To manage withdrawal symptoms: Avoid places, people, and activities that trigger your cravings. Remember why you want to quit. Get plenty of sleep. Avoid coffee and other drinks that contain caffeine. These may worsen some of your symptoms. How to manage cravings Come up with a plan for how to deal with your cravings. The plan should include the following: A definition of the specific situation you want to deal with. An activity or action you will take to replace smoking. A clear idea for how this action will help. The name of someone who could help you with this. Cravings usually last for 5-10 minutes. Consider taking the following actions to help you with your plan to deal with cravings: Keep your mouth busy. Chew sugar-free gum. Suck on hard candies or a straw. Brush your teeth. Keep your hands and body busy. Change to a different activity right away. Squeeze or play with a ball. Do an activity or a hobby, such as making bead jewelry, practicing needlepoint, or working with wood. Mix up your normal routine. Take a short exercise break. Go for a quick walk, or run up and down stairs. Focus on doing something kind or helpful for someone else. Call a friend or family member to talk during a craving. Join a support group. Contact a quitline. Where to find support To get help or find a support group: Call the [employer]'s Smoking Quitline: 1-800-QUIT-NOW (870)066-7559) Text QUIT to SmokefreeTXT: 629476 Where to find more information Visit these websites to find more information on quitting smoking: U.S. Department of Health and Human Services: www.smokefree.gov American Lung  Association: www.freedomfromsmoking.org Centers for Disease Control and Prevention (CDC): http://www.wolf.info/ American Heart Association: www.heart.org Contact a health care provider if: You want to change your plan for quitting. The medicines you are taking are not helping. Your eating feels out of control or you cannot sleep. You feel depressed or become very anxious. Summary Quitting smoking is a physical and mental challenge. You will face cravings, withdrawal symptoms, and temptation to smoke again. Preparation can help you as you go through these challenges. Try different techniques to manage stress, handle social situations, and prevent weight gain. You can deal with cravings by keeping your mouth busy (such as by chewing gum), keeping your hands and body busy, calling family or friends, or contacting a quitline for people who want to quit smoking. You can deal with withdrawal symptoms by avoiding places  where people smoke, getting plenty of rest, and avoiding drinks that contain caffeine. This information is not intended to replace advice given to you by your health care provider. Make sure you discuss any questions you have with your health care provider. Document Revised: 11/30/2021 Document Reviewed: 11/30/2021 Elsevier Patient Education  [place]. Exercise During Pregnancy Exercise is an important part of being healthy for people of all ages. Exercise improves the function of your heart and lungs and helps you maintain strength, flexibility, and a healthy body weight. Exercise also boosts energy levels and elevates mood. Most women should exercise regularly during pregnancy. Exercise routines may need to change as your pregnancy progresses. In rare cases, women with certain medical conditions or complications may be asked to limit or avoid exercise during pregnancy. Your health care provider will give you information on what will work for you. How does this affect me? Along with  maintaining general strength and flexibility, exercising during pregnancy can help: Keep strength in muscles that are used during labor and childbirth. Decrease low back pain or symptoms of depression. Control weight gain during pregnancy. Reduce the risk of needing insulin if you develop diabetes during pregnancy. Decrease the risk of cesarean delivery. Speed up your recovery after giving birth. Relieve constipation. How does this affect my baby? Exercise can help you have a healthy pregnancy. Exercise does not cause early (premature) birth. It will not cause your baby to weigh less at birth. What exercises can I do? Many exercises are safe for you to do during pregnancy. Do a variety of exercises that safely increase your heart and breathing rates and help you build and maintain muscle strength. Do exercises exactly as told by your health care provider. You may do these exercises: Walking. Swimming. Water aerobics. Riding a stationary bike. Modified yoga or Pilates. Tell your instructor that you are pregnant. Avoid overstretching, and avoid lying on your back for long periods of time. Running or jogging. Choose this type of exercise only if: You ran or jogged regularly before your pregnancy. You can run or jog and still talk in complete sentences. What exercises should I avoid? You may be told to limit high-intensity exercise depending on your level of fitness and whether you exercised regularly before you were pregnant. You can tell that you are exercising at a high intensity if you are breathing much harder and faster and cannot hold a conversation while exercising. You must avoid: Contact sports. Activities that put you at risk for falling on or being hit in the belly, such as downhill skiing, waterskiing, surfing, rock climbing, cycling, gymnastics, and horseback riding. Scuba diving. Skydiving. Hot yoga or hot Pilates. These activities take place in a room that is heated to high  temperatures. Jogging or running, unless you jogged or ran regularly before you were pregnant. While jogging or running, you should always be able to talk in full sentences. Do not run or jog so fast that you are unable to have a conversation. Do not exercise at more than 6,000 feet above sea level (high elevation) if you are not used to exercising at high elevation. How do I exercise in a safe way?  Avoid overheating. Do not exercise in very high temperatures. Wear loose-fitting, breathable clothes. Avoid dehydration. Drink enough fluid before, during, and after exercise to keep your urine pale yellow. Avoid overstretching. Because of hormone changes during pregnancy, it is easy to overstretch muscles, tendons, and ligaments. Start slowly and ask your health care provider  to recommend the types of exercise that are safe for you. Do not exercise to lose weight. Wear a sports bra to support your breasts. Avoid standing still or lying flat on your back as much as you can. Follow these instructions at home: Exercise on most days or all days of the week. Try to exercise for 30 minutes a day, 5 days a week, unless your health care provider tells you not to. If you actively exercised before your pregnancy and you are healthy, your health care provider may tell you to continue to do moderate-intensity to high-intensity exercise. If you are just starting to exercise or did not exercise much before your pregnancy, your health care provider may tell you to do low-intensity to moderate-intensity exercise. Questions to ask your health care provider Is exercise safe for me? What are signs that I should stop exercising? Does my health condition mean that I should not exercise during pregnancy? When should I avoid exercising during pregnancy? Stop exercising and contact a health care provider if: You have any unusual symptoms such as: Mild contractions of the uterus or cramps in the abdomen. A dizzy  feeling that does not go away when you rest. Stop exercising and get help right away if: You have any unusual symptoms such as: Sudden, severe pain in your low back or your belly. Regular, painful contractions of your uterus. Chest pain. Bleeding or fluid leaking from your vagina. Shortness of breath. Headache. Pain and swelling of your calves. Summary Most women should exercise regularly throughout pregnancy. In rare cases, women with certain medical conditions or complications may be asked to limit or avoid exercise during pregnancy. Do not exercise to lose weight during pregnancy. Your health care provider will tell you what level of physical activity is right for you. Stop exercising and contact a health care provider if you have unusual symptoms, such as mild contractions or dizziness. This information is not intended to replace advice given to you by your health care provider. Make sure you discuss any questions you have with your health care provider. Document Revised: 07/26/2020 Document Reviewed: 07/26/2020 Elsevier Patient Education  [place] for Pregnant Women While you are pregnant, your body requires additional nutrition to help support your growing baby. You also have a higher need for some vitamins and minerals, such as folic acid, calcium, iron, and vitamin D. Eating a healthy, well-balanced diet is very important for your health and your baby's health. Your need for extra calories varies over the course of your pregnancy. Pregnancy is divided into three trimesters, with each trimester lasting 3 months. For most women, it is recommended to consume: 150 extra calories a day during the first trimester. 300 extra calories a day during the second trimester. 300 extra calories a day during the third trimester. What are tips for following this plan? Cooking Practice good food safety and cleanliness. Wash your hands before you eat and after you prepare raw  meat. Wash all fruits and vegetables well before peeling or eating. Taking these actions can help to prevent foodborne illnesses that can be very dangerous to your baby, such as listeriosis. Ask your health care provider for more information about listeriosis. Make sure that all meats, poultry, and eggs are cooked to food-safe temperatures or "well-done." Meal planning  Eat a variety of foods (especially fruits and vegetables) to get a full range of vitamins and minerals. Two or more servings of fish are recommended each week in order to get  the most benefits from omega-3 fatty acids that are found in seafood. Choose fish that are lower in mercury, such as salmon and pollock. Limit your overall intake of foods that have "empty calories." These are foods that have little nutritional value, such as sweets, desserts, candies, and sugar-sweetened beverages. Drinks that contain caffeine are okay to drink, but it is better to avoid caffeine. Keep your total caffeine intake to less than 200 mg each day (which is 12 oz or 355 mL of coffee, tea, or soda) or the limit as told by your health care provider. General information Do not try to lose weight or go on a diet during pregnancy. Take a prenatal vitamin to help meet your additional vitamin and mineral needs during pregnancy, specifically for folic acid, iron, calcium, and vitamin D. Remember to stay active. Ask your health care provider what types of exercise and activities are safe for you. What does 150 extra calories look like? Healthy options that provide 150 extra calories each day could be any of the following: 6-8 oz (170-227 g) plain low-fat yogurt with  cup (70 g) berries. 1 apple with 2 tsp (11 g) peanut butter. Cut-up vegetables with  cup (60 g) hummus. 8 fl oz (237 mL) low-fat chocolate milk. 1 stick of string cheese with 1 medium orange. 1 peanut butter and jelly sandwich that is made with one slice of whole-wheat bread and 1 tsp (5 g) of  peanut butter. For 300 extra calories, you could eat two of these healthy options each day. What is a healthy amount of weight to gain? The right amount of weight gain for you is based on your BMI (body mass index) before you became pregnant. If your BMI was less than 18 (underweight), you should gain 28-40 lb (13-18 kg). If your BMI was 18-24.9 (normal), you should gain 25-35 lb (11-16 kg). If your BMI was 25-29.9 (overweight), you should gain 15-25 lb (7-11 kg). If your BMI was 30 or greater (obese), you should gain 11-20 lb (5-9 kg). What if I am having twins or multiples? Generally, if you are carrying twins or multiples: You may need to eat 300-600 extra calories a day. The recommended range for total weight gain is 25-54 lb (11-25 kg), depending on your BMI before pregnancy. Talk with your health care provider to find out about nutritional needs, weight gain, and exercise that is right for you. What foods should I eat?  Fruits All fruits. Eat a variety of colors and types of fruit. Remember to wash your fruits well before peeling or eating. Vegetables All vegetables. Eat a variety of colors and types of vegetables. Remember to wash your vegetables well before peeling or eating. Grains All grains. Choose whole grains, such as whole-wheat bread, oatmeal, or brown rice. Meats and other protein foods Lean meats, including chicken, [place], and lean cuts of beef, veal, or pork. Fish that is higher in omega-3 fatty acids and lower in mercury, such as salmon, herring, mussels, trout, sardines, pollock, shrimp, crab, and lobster. Tofu. Tempeh. Beans. Eggs. Peanut butter and other nut butters. Dairy Pasteurized milk and milk alternatives, such as almond milk. Pasteurized yogurt and pasteurized cheese. Cottage cheese. Sour cream. Beverages Water. Juices that contain 100% fruit juice or vegetable juice. Caffeine-free teas and decaffeinated coffee. Fats and oils Fats and oils are okay to include  in moderation. Sweets and desserts Sweets and desserts are okay to include in moderation. Seasoning and other foods All pasteurized condiments. The items listed  above may not be a complete list of foods and beverages you can eat. Contact a dietitian for more information. What foods should I avoid? Fruits Raw (unpasteurized) fruit juices. Vegetables Unpasteurized vegetable juices. Meats and other protein foods Precooked or cured meat, such as bologna, hot dogs, sausages, or meat loaves. (If you must eat those meats, reheat them until they are steaming hot.) Refrigerated pate, meat spreads from a meat counter, or smoked seafood that is found in the refrigerated section of a store. Raw or undercooked meats, poultry, and eggs. Raw fish, such as sushi or sashimi. Fish that have high mercury content, such as tilefish, shark, swordfish, and king mackerel. Dairy Unpasteurized milk and any foods that have unpasteurized milk in them. Soft cheeses, such as feta, queso blanco, queso fresco, [place], [place], panela, and blue-veined cheeses (unless they are made with pasteurized milk, which must be stated on the label). Beverages Alcohol. Sugar-sweetened beverages, such as sodas, teas, or energy drinks. Seasoning and other foods Homemade fermented foods and drinks, such as pickles, sauerkraut, or kombucha drinks. (Store-bought pasteurized versions of these are okay.) Salads that are made in a store or deli, such as ham salad, chicken salad, egg salad, tuna salad, and seafood salad. The items listed above may not be a complete list of foods and beverages you should avoid. Contact a dietitian for more information. Where to find more information To calculate the number of calories you need based on your height, weight, and activity level, you can use an online calculator such as: https://www.hunter.com/ To calculate how much weight you should gain during pregnancy, you can use an online pregnancy weight  gain calculator such as: MassVoice.es To learn more about eating fish during pregnancy, talk with your health care provider or visit: [place]G[employer].ch Summary While you are pregnant, your body requires additional nutrition to help support your growing baby. Eat a variety of foods, especially fruits and vegetables, to get a full range of vitamins and minerals. Practice good food safety and cleanliness. Wash your hands before you eat and after you prepare raw meat. Wash all fruits and vegetables well before peeling or eating. Taking these actions can help to prevent foodborne illnesses, such as listeriosis, that can be very dangerous to your baby. Do not eat raw meat or fish. Do not eat fish that have high mercury content, such as tilefish, shark, swordfish, and king mackerel. Do not eat raw (unpasteurized) dairy. Take a prenatal vitamin to help meet your additional vitamin and mineral needs during pregnancy, specifically for folic acid, iron, calcium, and vitamin D. This information is not intended to replace advice given to you by your health care provider. Make sure you discuss any questions you have with your health care provider. Document Revised: 07/06/2020 Document Reviewed: 07/06/2020 Elsevier Patient Education  [place]. Prenatal Care Prenatal care is health care during pregnancy. It helps you and your unborn baby (fetus) stay as healthy as possible. Prenatal care may be provided by a midwife, a family practice doctor, a IT consultant (nurse practitioner or physician assistant), or a childbirth and pregnancy doctor (obstetrician). How does this affect me? During pregnancy, you will be closely monitored for any new conditions that might develop. To lower your risk of pregnancy complications, you and your health care provider will talk about any underlying conditions you have. How does this affect my baby? Early and consistent prenatal care increases the chance that your baby  will be healthy during pregnancy. Prenatal care lowers the risk that  your baby will be: Born early (prematurely). Smaller than expected at birth (small for gestational age). What can I expect at the first prenatal care visit? Your first prenatal care visit will likely be the longest. You should schedule your first prenatal care visit as soon as you know that you are pregnant. Your first visit is a good time to talk about any questions or concerns you have about pregnancy. Medical history At your visit, you and your health care provider will talk about your medical history, including: Any past pregnancies. Your family's medical history. Medical history of the baby's father. Any long-term (chronic) health conditions you have and how you manage them. Any surgeries or procedures you have had. Any current over-the-counter or prescription medicines, herbs, or supplements that you are taking. Other factors that could pose a risk to your baby, including: Exposure to harmful chemicals or radiation at work or at home. Any substance use, including tobacco, alcohol, and drug use. Your home setting and your stress levels, including: Exposure to abuse or violence. Household financial strain. Your daily health habits, including diet and exercise. Tests and screenings Your health care provider will: Measure your weight, height, and blood pressure. Do a physical exam, including a pelvic and breast exam. Perform blood tests and urine tests to check for: Urinary tract infection. Sexually transmitted infections (STIs). Low iron levels in your blood (anemia). Blood type and certain proteins on red blood cells (Rh antibodies). Infections and immunity to viruses, such as hepatitis B and rubella. HIV (human immunodeficiency virus). Discuss your options for genetic screening. Tips about staying healthy Your health care provider will also give you information about how to keep yourself and your baby healthy,  including: Nutrition and taking vitamins. Physical activity. How to manage pregnancy symptoms such as nausea and vomiting (morning sickness). Infections and substances that may be harmful to your baby and how to avoid them. Food safety. Dental care. Working. Travel. Warning signs to watch for and when to call your health care provider. How often will I have prenatal care visits? After your first prenatal care visit, you will have regular visits throughout your pregnancy. The visit schedule is often as follows: Up to week 28 of pregnancy: once every 4 weeks. 28-36 weeks: once every 2 weeks. After 36 weeks: every week until delivery. Some women may have visits more or less often depending on any underlying health conditions and the health of the baby. Keep all follow-up and prenatal care visits. This is important. What happens during routine prenatal care visits? Your health care provider will: Measure your weight and blood pressure. Check for fetal heart sounds. Measure the height of your uterus in your abdomen (fundal height). This may be measured starting around week 20 of pregnancy. Check the position of your baby inside your uterus. Ask questions about your diet, sleeping patterns, and whether you can feel the baby move. Review warning signs to watch for and signs of labor. Ask about any pregnancy symptoms you are having and how you are dealing with them. Symptoms may include: Headaches. Nausea and vomiting. Vaginal discharge. Swelling. Fatigue. Constipation. Changes in your vision. Feeling persistently sad or anxious. Any discomfort, including back or pelvic pain. Bleeding or spotting. Make a list of questions to ask your health care provider at your routine visits. What tests might I have during prenatal care visits? You may have blood, urine, and imaging tests throughout your pregnancy, such as: Urine tests to check for glucose, protein, or signs of infection.  Glucose  tests to check for a form of diabetes that can develop during pregnancy (gestational diabetes mellitus). This is usually done around week 24 of pregnancy. Ultrasounds to check your baby's growth and development, to check for birth defects, and to check your baby's well-being. These can also help to decide when you should deliver your baby. A test to check for group B strep (GBS) infection. This is usually done around week 36 of pregnancy. Genetic testing. This may include blood, fluid, or tissue sampling, or imaging tests, such as an ultrasound. Some genetic tests are done during the first trimester and some are done during the second trimester. What else can I expect during prenatal care visits? Your health care provider may recommend getting certain vaccines during pregnancy. These may include: A yearly flu shot (annual influenza vaccine). This is especially important if you will be pregnant during flu season. Tdap (tetanus, diphtheria, pertussis) vaccine. Getting this vaccine during pregnancy can protect your baby from whooping cough (pertussis) after birth. This vaccine may be recommended between weeks 27 and 36 of pregnancy. A COVID-19 vaccine. Later in your pregnancy, your health care provider may give you information about: Childbirth and breastfeeding classes. Choosing a health care provider for your baby. Umbilical cord banking. Breastfeeding. Birth control after your baby is born. The hospital labor and delivery unit and how to set up a tour. Registering at the hospital before you go into labor. Where to find more information Office on Women's Health: LegalWarrants.gl American Pregnancy Association: americanpregnancy.org March of Dimes: marchofdimes.org Summary Prenatal care helps you and your baby stay as healthy as possible during pregnancy. Your first prenatal care visit will most likely be the longest. You will have visits and tests throughout your pregnancy to monitor your  health and your baby's health. Bring a list of questions to your visits to ask your health care provider. Make sure to keep all follow-up and prenatal care visits. This information is not intended to replace advice given to you by your health care provider. Make sure you discuss any questions you have with your health care provider. Document Revised: 09/21/2020 Document Reviewed: 09/21/2020 Elsevier Patient Education  [place].

## 2022-10-19 LAB — URINE CULTURE

## 2022-10-22 ENCOUNTER — Telehealth: Payer: Self-pay

## 2022-10-22 NOTE — Telephone Encounter (Signed)
I offer patient the next available opening with Dr Marcelline Mates which was 11/05/22. Patient states that is too far out and wants to know if she could be seen sooner because she is in pain. Please advise Work in?

## 2022-10-22 NOTE — Telephone Encounter (Signed)
Does any other provider have anything sooner?

## 2022-10-22 NOTE — Telephone Encounter (Signed)
Carla Ingram called triage line and left voicemail about her 10/30/22 appt with Marcelline Mates was cancelled and she was scheduled with Amalia Hailey on 10/29/22 she doesn't want to see Amalia Hailey she wants to see [facility].  Please call her and get her back scheduled with Dr. Marcelline Mates.

## 2022-10-23 ENCOUNTER — Other Ambulatory Visit: Payer: Self-pay | Admitting: Advanced Practice Midwife

## 2022-10-23 DIAGNOSIS — O2342 Unspecified infection of urinary tract in pregnancy, second trimester: Secondary | ICD-10-CM

## 2022-10-23 MED ORDER — CEPHALEXIN 500 MG PO CAPS: 500.0000 mg | ORAL_CAPSULE | Freq: Four times a day (QID) | ORAL | 0 refills | Status: DC

## 2022-10-23 NOTE — Telephone Encounter (Signed)
I contacted patient via phone. I left detailed message for patient to call back to confirm.

## 2022-10-23 NOTE — Progress Notes (Signed)
Limited choice of antibiotics to treat due to susceptibility/patient allergies. Keflex (previous reaction "aches in bones") Rx sent and message sent to patient.

## 2022-10-23 NOTE — Telephone Encounter (Signed)
Patient called to confirm.

## 2022-10-24 ENCOUNTER — Ambulatory Visit (INDEPENDENT_AMBULATORY_CARE_PROVIDER_SITE_OTHER): Payer: Medicaid Other | Admitting: Obstetrics and Gynecology

## 2022-10-24 ENCOUNTER — Encounter: Payer: Self-pay | Admitting: Obstetrics and Gynecology

## 2022-10-24 VITALS — BP 111/61 | HR 90 | Wt 202.8 lb

## 2022-10-24 DIAGNOSIS — Z98891 History of uterine scar from previous surgery: Secondary | ICD-10-CM

## 2022-10-24 DIAGNOSIS — Z3689 Encounter for other specified antenatal screening: Secondary | ICD-10-CM

## 2022-10-24 DIAGNOSIS — Z8759 Personal history of other complications of pregnancy, childbirth and the puerperium: Secondary | ICD-10-CM | POA: Insufficient documentation

## 2022-10-24 DIAGNOSIS — O34219 Maternal care for unspecified type scar from previous cesarean delivery: Secondary | ICD-10-CM

## 2022-10-24 DIAGNOSIS — O3481 Maternal care for other abnormalities of pelvic organs, first trimester: Secondary | ICD-10-CM

## 2022-10-24 DIAGNOSIS — O0991 Supervision of high risk pregnancy, unspecified, first trimester: Secondary | ICD-10-CM

## 2022-10-24 DIAGNOSIS — O09291 Supervision of pregnancy with other poor reproductive or obstetric history, first trimester: Secondary | ICD-10-CM

## 2022-10-24 DIAGNOSIS — O2342 Unspecified infection of urinary tract in pregnancy, second trimester: Secondary | ICD-10-CM

## 2022-10-24 DIAGNOSIS — E669 Obesity, unspecified: Secondary | ICD-10-CM

## 2022-10-24 DIAGNOSIS — N83201 Unspecified ovarian cyst, right side: Secondary | ICD-10-CM

## 2022-10-24 DIAGNOSIS — Z6835 Body mass index (BMI) 35.0-35.9, adult: Secondary | ICD-10-CM | POA: Insufficient documentation

## 2022-10-24 DIAGNOSIS — O99210 Obesity complicating pregnancy, unspecified trimester: Secondary | ICD-10-CM

## 2022-10-24 DIAGNOSIS — Z3A13 13 weeks gestation of pregnancy: Secondary | ICD-10-CM

## 2022-10-24 LAB — POCT URINALYSIS DIPSTICK OB
Bilirubin, UA: NEGATIVE
Glucose, UA: NEGATIVE
Ketones, UA: NEGATIVE
Leukocytes, UA: NEGATIVE
Nitrite, UA: POSITIVE
Spec Grav, UA: 1.015 (ref 1.010–1.025)
Urobilinogen, UA: 0.2 E.U./dL
pH, UA: 7 (ref 5.0–8.0)

## 2022-10-24 MED ORDER — ASPIRIN 81 MG PO TBEC: 81.0000 mg | DELAYED_RELEASE_TABLET | Freq: Every day | ORAL | 2 refills | Status: DC

## 2022-10-24 MED ORDER — TRAMADOL HCL 50 MG PO TABS: 50.0000 mg | ORAL_TABLET | Freq: Four times a day (QID) | ORAL | 0 refills | Status: DC | PRN

## 2022-10-24 NOTE — Progress Notes (Signed)
ROB [redacted]w[redacted]d: She is here for evaluation of large complex right ovarian cyst. She is doing well, has no new concerns today.

## 2022-10-24 NOTE — Progress Notes (Signed)
OBSTETRICS CLINIC PROGRESS NOTE Subjective:    Carla Ingram is a 26 y.o. [phone] female being seen today for a problem visit.  She is at [redacted]w[redacted]d gestation. Patient reports that she desires a second opinion.  She was diagnosed with a large complex right ovarian cyst (6 cm) 1.5 months ago after being seen in the Emergency Room for bleeding and was noted to be pregnant (09/13/2022). Was seen approximately 2 weeks later (10/02/22) for repeat imaging which noted cyst had enlarged to 9.3 cm.  Two weeks after this (10/15/2022), she was seen in the Emergency Room due to complaints or RLQ pain and nausea and was treated with IV pain medication.  Repeat US performed at that time noted cyst size to be 9.6 cm.  Notes that she was told by previous OB provider that the cyst would just "go away", but desired second opinion as it seems that her cyst is getting larger and she is having more frequent episodes of pain that Tylenol cannot manage. Usually episodes last between 2-4 hours, however last episode lasted for over a day which prompted her to f/u in the ER.    Patient Active Problem List   Diagnosis Date Noted   BMI 35.0-35.9,adult 10/24/2022   History of placenta abruption 10/24/2022   Obesity affecting pregnancy 10/17/2022   Supervision of high-risk pregnancy 10/02/2022   Ovarian cyst, right 11/25/2019   Urinary tract infection in mother during second trimester of pregnancy 04/25/2017   History of 4 cesarean sections 07/09/2016   B12 deficiency 11/01/2015   Tobacco use disorder 11/01/2015     OB History  Gravida Para Term Preterm AB Living  5 4 3 1  0 4  SAB IAB Ectopic Multiple Live Births  0 0 0 0 4    # Outcome Date GA Lbr Len/2nd Weight Sex Delivery Anes PTL Lv  5 Current           4 Term 11/25/19 [redacted]w[redacted]d  7 lb 0.5 oz (3.19 kg) M CS-LTranv Spinal  LIV  3 Term 07/09/16 [redacted]w[redacted]d  7 lb 5.5 oz (3.33 kg) F CS-LTranv Spinal  LIV  2 Term 01/19/15 [redacted]w[redacted]d  6 lb 10 oz (3.005 kg) F CS-LTranv Spinal  LIV   1 Preterm 02/02/11 [redacted]w[redacted]d  5 lb 5 oz (2.41 kg) F CS-LTranv Gen  LIV     Complications: Abruptio Placenta     The following portions of the patient's history were reviewed and updated as appropriate: allergies, current medications, past family history, past medical history, past social history, past surgical history, and problem list.  Review of Systems Pertinent items noted in HPI and remainder of comprehensive ROS otherwise negative.   Objective:     BP 111/61   Pulse 90   Wt 202 lb 12.8 oz (92 kg)   LMP 07/23/2022   BMI 38.32 kg/m  Uterine Size: size equals dates  FHT: 140 bpm     Assessment:   1. Supervision of high risk pregnancy in second trimester   2. [redacted] weeks gestation of pregnancy   3. History of placenta abruption   4. Obesity affecting pregnancy in first trimester, unspecified obesity type   5. BMI 35.0-35.9,adult   6. History of 4 cesarean sections   7. Urinary tract infection in mother during second trimester of pregnancy   8. Ovarian cyst, right      Plan:   1. Supervision of high risk pregnancy in second trimester - Patient aware of high risk nature of  pregnancy.  - POC Urinalysis Dipstick OB - Needs pap smear at next visit, declined today.   2. [redacted] weeks gestation of pregnancy - POC Urinalysis Dipstick OB - Normal genetic screening results discussed, for AFP next visit.   3. Ovarian cyst, right - ~ 10 cm cyst noted on right ovary, with enlargement over the past month (previously 6.9 cm on initial diagnosis). Attempted to reassure patient that cyst was benign, and that most cysts do shrink or completely resolve by the end of the pregnancy. Discussed that if large cyst still present by the end of her pregnancy, can be removed during C-section.  - Patient with intermittent episodes of pain, not relieved by Tylenol. Discussed options for pain management in pregnancy, including use of warm compresses, lying down. Can also give short supply of Tramadol for  worse pain, advised to use sparingly, and discussed risks vs benefits of use during pregnancy.   4. History of placenta abruption - Recommend daily baby aspirin 81 mg.   5. Obesity in pregnancy, antepartum - Needs HgbA1c, patient desires to hold on blood draw until next visit.   6. History of 4 cesarean sections - High risk pregnancy due to multiple C-sections. Will send to MFM for anatomy scan and for placental assessment as patient is high risk for placental issues (accreta, percreta).  - Patient reports that when she was in the ER she was told she might need an MRI in order to see her appendix as she was having RLQ pain. Advised that since pain has now mostly resolved and was most likely due to the cyst was not needed for this reason, but may be considered in early third trimester to further assess placental attachment.   7. Urinary tract infection in mother during second trimester of pregnancy - Patient diagnosed with UTI yesterday, prescribed Keflex. Will need repeat urine culture at next visit.    Rubie Maid, MD [facility] at [facility]

## 2022-10-29 ENCOUNTER — Encounter: Payer: Medicaid Other | Admitting: Obstetrics and Gynecology

## 2022-10-30 ENCOUNTER — Encounter: Payer: Medicaid Other | Admitting: Obstetrics and Gynecology

## 2022-11-12 ENCOUNTER — Encounter: Payer: Medicaid Other | Admitting: Obstetrics

## 2022-11-27 ENCOUNTER — Encounter: Payer: Self-pay | Admitting: Licensed Practical Nurse

## 2022-11-27 ENCOUNTER — Ambulatory Visit (INDEPENDENT_AMBULATORY_CARE_PROVIDER_SITE_OTHER): Payer: Medicaid Other | Admitting: Licensed Practical Nurse

## 2022-11-27 ENCOUNTER — Encounter: Payer: Medicaid Other | Admitting: Advanced Practice Midwife

## 2022-11-27 VITALS — BP 118/70 | HR 90 | Wt 206.5 lb

## 2022-11-27 DIAGNOSIS — O99212 Obesity complicating pregnancy, second trimester: Secondary | ICD-10-CM

## 2022-11-27 DIAGNOSIS — E669 Obesity, unspecified: Secondary | ICD-10-CM

## 2022-11-27 DIAGNOSIS — O34219 Maternal care for unspecified type scar from previous cesarean delivery: Secondary | ICD-10-CM

## 2022-11-27 DIAGNOSIS — O0991 Supervision of high risk pregnancy, unspecified, first trimester: Secondary | ICD-10-CM | POA: Diagnosis not present

## 2022-11-27 DIAGNOSIS — F17210 Nicotine dependence, cigarettes, uncomplicated: Secondary | ICD-10-CM

## 2022-11-27 DIAGNOSIS — O99282 Endocrine, nutritional and metabolic diseases complicating pregnancy, second trimester: Secondary | ICD-10-CM

## 2022-11-27 DIAGNOSIS — O3482 Maternal care for other abnormalities of pelvic organs, second trimester: Secondary | ICD-10-CM

## 2022-11-27 DIAGNOSIS — Z131 Encounter for screening for diabetes mellitus: Secondary | ICD-10-CM

## 2022-11-27 DIAGNOSIS — Z3A18 18 weeks gestation of pregnancy: Secondary | ICD-10-CM | POA: Diagnosis not present

## 2022-11-27 DIAGNOSIS — N83201 Unspecified ovarian cyst, right side: Secondary | ICD-10-CM

## 2022-11-27 DIAGNOSIS — O99332 Smoking (tobacco) complicating pregnancy, second trimester: Secondary | ICD-10-CM

## 2022-11-27 DIAGNOSIS — O09292 Supervision of pregnancy with other poor reproductive or obstetric history, second trimester: Secondary | ICD-10-CM

## 2022-11-27 DIAGNOSIS — E538 Deficiency of other specified B group vitamins: Secondary | ICD-10-CM

## 2022-11-27 LAB — POCT URINALYSIS DIPSTICK
Bilirubin, UA: NEGATIVE
Blood, UA: NEGATIVE
Glucose, UA: NEGATIVE
Ketones, UA: NEGATIVE
Leukocytes, UA: NEGATIVE
Nitrite, UA: POSITIVE
Protein, UA: NEGATIVE
Spec Grav, UA: 1.015 (ref 1.010–1.025)
Urobilinogen, UA: 1 E.U./dL
pH, UA: 7 (ref 5.0–8.0)

## 2022-11-27 NOTE — Progress Notes (Signed)
Routine Prenatal Care Visit  Subjective  Carla Ingram is a 26 y.o. [id number] at [redacted]w[redacted]d being seen today for ongoing prenatal care.  She is currently monitored for the following issues for this high-risk pregnancy and has B12 deficiency; Tobacco use disorder; History of 4 cesarean sections; Urinary tract infection in mother during second trimester of pregnancy; Ovarian cyst, right; Supervision of high-risk pregnancy; Obesity affecting pregnancy; BMI 35.0-35.9,adult; and History of placenta abruption on their problem list.  ----------------------------------------------------------------------------------- Patient reports fatigue.  Here with Hulen Skains. Is up for most of the night.  Pain from cyst improving "not as bad"  Contractions: Not present. Vag. Bleeding: None.  Movement: Present. Leaking Fluid denies.  ----------------------------------------------------------------------------------- The following portions of the patient's history were reviewed and updated as appropriate: allergies, current medications, past family history, past medical history, past social history, past surgical history and problem list. Problem list updated.  Objective  Blood pressure 118/70, pulse 90, weight 206 lb 8 oz (93.7 kg), last menstrual period 07/23/2022. Pregravid weight 193 lb (87.5 kg) Total Weight Gain 13 lb 8 oz (6.124 kg) Urinalysis: Urine Protein    Urine Glucose    Fetal Status: Fetal Heart Rate (bpm): 138   Movement: Present     General:  Alert, oriented and cooperative. Patient is in no acute distress.  Skin: Skin is warm and dry. No rash noted.   Cardiovascular: Normal heart rate noted  Respiratory: Normal respiratory effort, no problems with respiration noted  Abdomen: Soft, gravid, appropriate for gestational age. Pain/Pressure: Absent     Pelvic:  Cervical exam deferred        Extremities: Normal range of motion.  Edema: None  Mental Status: Normal mood and affect. Normal behavior. Normal  judgment and thought content.   Assessment   26 y.o. [id number] at [redacted]w[redacted]d by  04/29/2023, by Last Menstrual Period presenting for routine prenatal visit  Plan   G5 Problems (from 09/13/22 to present)     Problem Noted Resolved   Obesity affecting pregnancy 10/17/2022 by Rod Can, CNM No   Supervision of high-risk pregnancy 10/02/2022 by Cleophas Dunker, [place] No   Overview Addendum 10/17/2022 10:02 AM by Rod Can, CNM     Clinical Staff Provider  Office Location  [facility] Dating  EDD by LMP c/w 6w u/s  Language  English Anatomy US    Flu Vaccine  offer Genetic Screen  NIPS:   TDaP vaccine   offer Hgb A1C or  GTT Early : Third trimester :   Covid One dose   LAB RESULTS   Rhogam  O/Positive/-- (10/18 1105)  Blood Type O/Positive/-- (10/18 1105)   Feeding Plan breast Antibody Negative (10/18 1105)  Contraception  Rubella 1.88 (10/18 1105)  Circumcision yes RPR Non Reactive (10/18 1105)   Pediatrician  [facility] HBsAg Negative (10/18 1105)   Support Person Randall HIV Non Reactive (10/18 1105)  Prenatal Classes no Varicella     GBS  (For PCN allergy, check sensitivities)   BTL Consent  Hep C Non Reactive (10/18 1105)   VBAC Consent Will have repeat c/s Pap Diagnosis  Date Value Ref Range Status  05/10/2019   Final   NEGATIVE FOR INTRAEPITHELIAL LESIONS OR MALIGNANCY.      Hgb Electro      CF      SMA        9 cm right ovarian cyst Hx 4 cesarean deliveries      History of 4 cesarean sections 07/09/2016 by Teresa Coombs  P, MD No   Overview Signed 10/24/2022 11:27 PM by Hildred Laser, MD    4 C-sections          general obstetric precautions including but not limited to vaginal bleeding, contractions, leaking of fluid and fetal movement were reviewed in detail with the patient. Please refer to After Visit Summary for other counseling recommendations.   RTC 4 weeks  Has anatomy US scheduled  UTI at last visit, Urine culture sent  HA1C collected   Carie Caddy, [place]   [facility]  12/01/22  4:18 PM

## 2022-11-28 LAB — HEMOGLOBIN A1C
Est. average glucose Bld gHb Est-mCnc: 103 mg/dL
Hgb A1c MFr Bld: 5.2 % (ref 4.8–5.6)

## 2022-11-30 LAB — URINE CULTURE

## 2022-12-01 ENCOUNTER — Other Ambulatory Visit: Payer: Self-pay | Admitting: Licensed Practical Nurse

## 2022-12-01 DIAGNOSIS — O2342 Unspecified infection of urinary tract in pregnancy, second trimester: Secondary | ICD-10-CM

## 2022-12-01 MED ORDER — SULFAMETHOXAZOLE-TRIMETHOPRIM 800-160 MG PO TABS: 1.0000 | ORAL_TABLET | Freq: Two times a day (BID) | ORAL | 1 refills | Status: DC

## 2022-12-01 NOTE — Progress Notes (Signed)
TC to [facility] Your urine cultures shows that you have a UTI, was treatted with Keflex about a month ago, pt states she did not  have a reaction to it. Knows that she has used Bactrim in the past with good results Script for Bactrim sent to pharmacy  Carie Caddy, CNM  Domingo Pulse, [facility]  12/01/22  4:15 PM

## 2022-12-10 ENCOUNTER — Other Ambulatory Visit: Payer: Self-pay

## 2022-12-10 ENCOUNTER — Other Ambulatory Visit: Payer: Self-pay | Admitting: Obstetrics and Gynecology

## 2022-12-10 ENCOUNTER — Ambulatory Visit: Payer: Medicaid Other | Attending: Maternal & Fetal Medicine

## 2022-12-10 DIAGNOSIS — N83201 Unspecified ovarian cyst, right side: Secondary | ICD-10-CM

## 2022-12-10 DIAGNOSIS — Z98891 History of uterine scar from previous surgery: Secondary | ICD-10-CM

## 2022-12-10 DIAGNOSIS — Z3A20 20 weeks gestation of pregnancy: Secondary | ICD-10-CM | POA: Diagnosis not present

## 2022-12-10 DIAGNOSIS — O99210 Obesity complicating pregnancy, unspecified trimester: Secondary | ICD-10-CM

## 2022-12-10 DIAGNOSIS — E669 Obesity, unspecified: Secondary | ICD-10-CM | POA: Diagnosis not present

## 2022-12-10 DIAGNOSIS — Z363 Encounter for antenatal screening for malformations: Secondary | ICD-10-CM | POA: Diagnosis not present

## 2022-12-10 DIAGNOSIS — O09292 Supervision of pregnancy with other poor reproductive or obstetric history, second trimester: Secondary | ICD-10-CM | POA: Diagnosis not present

## 2022-12-10 DIAGNOSIS — O3482 Maternal care for other abnormalities of pelvic organs, second trimester: Secondary | ICD-10-CM

## 2022-12-10 DIAGNOSIS — O0991 Supervision of high risk pregnancy, unspecified, first trimester: Secondary | ICD-10-CM

## 2022-12-10 DIAGNOSIS — Z3A13 13 weeks gestation of pregnancy: Secondary | ICD-10-CM

## 2022-12-10 DIAGNOSIS — Z8759 Personal history of other complications of pregnancy, childbirth and the puerperium: Secondary | ICD-10-CM

## 2022-12-10 DIAGNOSIS — O2342 Unspecified infection of urinary tract in pregnancy, second trimester: Secondary | ICD-10-CM

## 2022-12-10 DIAGNOSIS — Z3689 Encounter for other specified antenatal screening: Secondary | ICD-10-CM

## 2022-12-10 DIAGNOSIS — O34219 Maternal care for unspecified type scar from previous cesarean delivery: Secondary | ICD-10-CM | POA: Diagnosis not present

## 2022-12-10 DIAGNOSIS — O99212 Obesity complicating pregnancy, second trimester: Secondary | ICD-10-CM | POA: Insufficient documentation

## 2022-12-19 ENCOUNTER — Other Ambulatory Visit: Payer: Self-pay | Admitting: Obstetrics

## 2022-12-19 ENCOUNTER — Telehealth: Payer: Self-pay

## 2022-12-19 MED ORDER — CIPROFLOXACIN HCL 500 MG PO TABS: 250.0000 mg | ORAL_TABLET | Freq: Two times a day (BID) | ORAL | 0 refills | Status: DC

## 2022-12-19 NOTE — Telephone Encounter (Signed)
I sent in an order for Cipro for her to take twice a day for three days.  Thanks!  Missy

## 2022-12-19 NOTE — Telephone Encounter (Signed)
Carla Ingram called triage and stated that the medication she got on 12/10 to treat for UTI its making her whole body, joint, & bones hurt, She thought it was cause she took it the first time on 12/15 with no food, took it again today with food, plenty of water and getting the same reaction.  Patient is pregnant.  Please advise on what she can take?

## 2022-12-23 NOTE — L&D Delivery Note (Signed)
Delivery Summary for Carla Ingram  Labor Events:   Preterm labor: No data found  Rupture date: No data found  Rupture time: No data found  Rupture type: No data found  Fluid Color: No data found  Induction: No data found  Augmentation: No data found  Complications: No data found  Cervical ripening: No data found No data found   No data found     Delivery:   Episiotomy: No data found  Lacerations: No data found  Repair suture: No data found  Repair # of packets: No data found  Blood loss (ml): 645   Information for the patient's newborn:  Jiah, Bari [[id number]]   Delivery 04/09/2023 8:33 AM by  C-Section, Low Transverse Sex:  female Gestational Age: [redacted]w[redacted]d Delivery Clinician:   Living?:         APGARS  One minute Five minutes Ten minutes  Skin color:        Heart rate:        Grimace:        Muscle tone:        Breathing:        Totals: 8  9      Presentation/position:      Resuscitation:   Cord information:    Disposition of cord blood:     Blood gases sent?  Complications:   Placenta: Delivered:       appearance Newborn Measurements: Weight: 5 lb 11.7 oz (2600 g)  Height:    Head circumference:    Chest circumference:    Other providers:    Additional  information: Forceps:   Vacuum:   Breech:   Observed anomalies       See Dr. Oretha Milch operative note for details of C-section procedure.   Hildred Laser, MD [facility] at [facility]

## 2022-12-25 ENCOUNTER — Encounter: Payer: Self-pay | Admitting: Obstetrics and Gynecology

## 2022-12-25 ENCOUNTER — Ambulatory Visit (INDEPENDENT_AMBULATORY_CARE_PROVIDER_SITE_OTHER): Payer: Medicaid Other | Admitting: Obstetrics and Gynecology

## 2022-12-25 VITALS — BP 104/63 | HR 99 | Wt 212.3 lb

## 2022-12-25 DIAGNOSIS — Z3A22 22 weeks gestation of pregnancy: Secondary | ICD-10-CM

## 2022-12-25 DIAGNOSIS — O99212 Obesity complicating pregnancy, second trimester: Secondary | ICD-10-CM

## 2022-12-25 DIAGNOSIS — Z98891 History of uterine scar from previous surgery: Secondary | ICD-10-CM

## 2022-12-25 DIAGNOSIS — O0991 Supervision of high risk pregnancy, unspecified, first trimester: Secondary | ICD-10-CM

## 2022-12-25 DIAGNOSIS — Z6791 Unspecified blood type, Rh negative: Secondary | ICD-10-CM

## 2022-12-25 DIAGNOSIS — Z130 Encounter for screening for diseases of the blood and blood-forming organs and certain disorders involving the immune mechanism: Secondary | ICD-10-CM

## 2022-12-25 DIAGNOSIS — Z23 Encounter for immunization: Secondary | ICD-10-CM

## 2022-12-25 DIAGNOSIS — O2342 Unspecified infection of urinary tract in pregnancy, second trimester: Secondary | ICD-10-CM

## 2022-12-25 DIAGNOSIS — Z113 Encounter for screening for infections with a predominantly sexual mode of transmission: Secondary | ICD-10-CM

## 2022-12-25 DIAGNOSIS — Z131 Encounter for screening for diabetes mellitus: Secondary | ICD-10-CM

## 2022-12-25 LAB — POCT URINALYSIS DIPSTICK OB
Bilirubin, UA: NEGATIVE
Blood, UA: NEGATIVE
Glucose, UA: NEGATIVE
Ketones, UA: NEGATIVE
Leukocytes, UA: NEGATIVE
POC,PROTEIN,UA: NEGATIVE
Spec Grav, UA: 1.01 (ref 1.010–1.025)
Urobilinogen, UA: 0.2 E.U./dL
pH, UA: 7 (ref 5.0–8.0)

## 2022-12-25 NOTE — Progress Notes (Signed)
ROB [redacted]w[redacted]d: She feels tired today. She is having pain in her back and on her right lower abdomen, unable to sleep.

## 2022-12-25 NOTE — Progress Notes (Signed)
ROB: Patient is a 27 y.o. [id number] female at [redacted]w[redacted]d.  She complains today of restless legs, discussed home remedies for comfort.  Also c/o knot on left breast, has been there for several days, becoming more tender. Started out as a pimple. Exam notes possible folliculitis vs small abscess, ~ 2 x 2.5 cm. Advised on warm compresses, topical antibiotic ointment. If no improvement, may need oral antibiotics. Currentl already on Cipro for UTI diagnosed last month (notes she was only recently able to pick up prescription, originally prescribed Bactrim but had an allergic reaction so changed to Cipro).  Paitent also has questions about last MFM ultrasound, noting they reported cyst size of 10 cm but report says cyst is 14 cm. Discussed with patient regarding US findings. Will continue to monitor growth at next ultrasound. RTC in 4 weeks.

## 2023-01-01 ENCOUNTER — Observation Stay
Admission: EM | Admit: 2023-01-01 | Discharge: 2023-01-01 | Disposition: A | Discharge status: Home / Self Care | Payer: Medicaid Other | Attending: Obstetrics and Gynecology | Admitting: Obstetrics and Gynecology

## 2023-01-01 ENCOUNTER — Other Ambulatory Visit: Payer: Self-pay

## 2023-01-01 ENCOUNTER — Encounter: Payer: Self-pay | Admitting: Obstetrics and Gynecology

## 2023-01-01 DIAGNOSIS — Z87891 Personal history of nicotine dependence: Secondary | ICD-10-CM | POA: Insufficient documentation

## 2023-01-01 DIAGNOSIS — M549 Dorsalgia, unspecified: Secondary | ICD-10-CM

## 2023-01-01 DIAGNOSIS — R1010 Upper abdominal pain, unspecified: Secondary | ICD-10-CM | POA: Insufficient documentation

## 2023-01-01 DIAGNOSIS — Z3A23 23 weeks gestation of pregnancy: Secondary | ICD-10-CM

## 2023-01-01 DIAGNOSIS — R109 Unspecified abdominal pain: Secondary | ICD-10-CM | POA: Diagnosis not present

## 2023-01-01 DIAGNOSIS — O26892 Other specified pregnancy related conditions, second trimester: Secondary | ICD-10-CM | POA: Diagnosis not present

## 2023-01-01 LAB — URINALYSIS, COMPLETE (UACMP) WITH MICROSCOPIC
Bilirubin Urine: NEGATIVE
Glucose, UA: NEGATIVE mg/dL
Hgb urine dipstick: NEGATIVE
Ketones, ur: NEGATIVE mg/dL
Leukocytes,Ua: NEGATIVE
Nitrite: NEGATIVE
Protein, ur: NEGATIVE mg/dL
Specific Gravity, Urine: 1.008 (ref 1.005–1.030)
pH: 7 (ref 5.0–8.0)

## 2023-01-01 NOTE — Discharge Summary (Signed)
Physician Final Progress Note  Patient ID: Carla Ingram MRN: [id number] DOB/AGE: Oct 10, 1996 26 y.o.  Admit date: 01/01/2023 Admitting provider: Tresea Mall, CNM Discharge date: 01/01/2023   Admission Diagnoses:  1) intrauterine pregnancy at [redacted]w[redacted]d  2) abdominal pain 3) back pain  Discharge Diagnoses:  Principal Problem:   Abdominal pain during pregnancy in second trimester Active Problems:   [redacted] weeks gestation of pregnancy    History of Present Illness: The patient is a 27 y.o. female [phone] at [redacted]w[redacted]d who presents for upper abdominal pain that is sharp and goes across the upper abdomen. The pain is constant. It started last night and was strong. Tonight it is mild. She has not taken anything for the pain. She also mentions back pain for the past 4 days that starts midback and goes to her low back. She has recently been treated for UTI with cipro. She reports good fetal movement. She denies vaginal bleeding, leakage of fluid, and contractions. She was admitted for observation and placed on monitors, UA done. Monitoring is reassuring. She is advised to go to ER for severe symptoms- pain vaginal bleeding and for symptoms of abruption prior to 28 weeks to go to [facility] for best neonatal care. She is advised that if the pain worsens she may need imaging/labs.  Past Medical History:  Diagnosis Date   Anemia    GERD (gastroesophageal reflux disease)    OCC   Migraine    Placental abruption    2012 at 35 wks   Positive pregnancy test    Psoriasis    TMJ (dislocation of temporomandibular joint)     Past Surgical History:  Procedure Laterality Date   CESAREAN SECTION  2012, 2016   CESAREAN SECTION N/A 07/09/2016   Procedure: CESAREAN SECTION;  Surgeon: Nadara Mustard, MD;  Location: [facility] ORS;  Service: Obstetrics;  Laterality: N/A;   CESAREAN SECTION N/A 11/25/2019   Procedure: CESAREAN SECTION;  Surgeon: Nadara Mustard, MD;  Location: [facility] ORS;  Service: Obstetrics;  Laterality:  N/A;   WISDOM TOOTH EXTRACTION     3; age14    No current facility-administered medications on file prior to encounter.   Current Outpatient Medications on File Prior to Encounter  Medication Sig Dispense Refill   Prenatal Vit-Fe Fumarate-FA (MULTIVITAMIN-PRENATAL) 27-0.8 MG TABS tablet Take 1 tablet by mouth daily at 12 noon.     acetaminophen (TYLENOL) 500 MG tablet Take 500 mg by mouth every 6 (six) hours as needed.     aspirin EC 81 MG tablet Take 1 tablet (81 mg total) by mouth daily. Take after 12 weeks for prevention of preeclampssia later in pregnancy (Patient not taking: Reported on 01/01/2023) 300 tablet 2   ciprofloxacin (CIPRO) 500 MG tablet Take 0.5 tablets (250 mg total) by mouth 2 (two) times daily. (Patient not taking: Reported on 01/01/2023) 6 tablet 0    Allergies  Allergen Reactions   Macrobid [Nitrofurantoin Macrocrystal] Other (See Comments)    Can not move her body, loses all control of arms legs    Augmentin [Amoxicillin-Pot Clavulanate] Hives and Swelling   Cephalexin Other (See Comments)    Achy in bones    Penicillins Hives and Swelling    Did it involve swelling of the face/tongue/throat, SOB, or low BP? No Did it involve sudden or severe rash/hives, skin peeling, or any reaction on the inside of your mouth or nose? No Did you need to seek medical attention at a hospital or doctor's office? No When did  it last happen?  childhood allergy     If all above answers are "NO", may proceed with cephalosporin use.     Social History   Socioeconomic History   Marital status: Single    Spouse name: Not on file   Number of children: 4   Years of education: 9   Highest education level: 9th grade  Occupational History   Occupation: unemployed   Tobacco Use   Smoking status: Every Day    Packs/day: 0.15    Years: 11.00    Total pack years: 1.65    Types: Cigarettes    Start date: 10/31/2004   Smokeless tobacco: Never  Vaping Use   Vaping Use: Former   Substance and Sexual Activity   Alcohol use: Not Currently    Alcohol/week: 8.0 standard drinks of alcohol    Types: 8 Shots of liquor per week    Comment: last use- 2021, she binge drinks when has a weekend without kids - rum   Drug use: No   Sexual activity: Yes    Partners: Male    Birth control/protection: None  Other Topics Concern   Not on file  Social History Narrative   Lives with her cousin ( raised her) , three daughter's - first one from a different father.    Social Determinants of Health   Financial Resource Strain: Low Risk  (10/02/2022)   Overall Financial Resource Strain (CARDIA)    Difficulty of Paying Living Expenses: Not hard at all  Food Insecurity: No Food Insecurity (10/02/2022)   Hunger Vital Sign    Worried About Running Out of Food in the Last Year: Never true    Ran Out of Food in the Last Year: Never true  Transportation Needs: No Transportation Needs (10/02/2022)   PRAPARE - Hydrologist (Medical): No    Lack of Transportation (Non-Medical): No  Physical Activity: Inactive (10/02/2022)   Exercise Vital Sign    Days of Exercise per Week: 0 days    Minutes of Exercise per Session: 0 min  Stress: No Stress Concern Present (10/02/2022)   Graf    Feeling of Stress : Not at all  Social Connections: Moderately Isolated (10/02/2022)   Social Connection and Isolation Panel [NHANES]    Frequency of Communication with Friends and Family: More than three times a week    Frequency of Social Gatherings with Friends and Family: Twice a week    Attends Religious Services: Never    Marine scientist or Organizations: No    Attends Archivist Meetings: Never    Marital Status: Living with partner  Intimate Partner Violence: Not At Risk (10/02/2022)   Humiliation, Afraid, Rape, and Kick questionnaire    Fear of Current or Ex-Partner: No     Emotionally Abused: No    Physically Abused: No    Sexually Abused: No    Family History  Adopted: Yes  Problem Relation Age of Onset   CVA Mother    Stroke Mother        during pregnancy   ADD / ADHD Sister    ADD / ADHD Brother    ADD / ADHD Brother    ADD / ADHD Brother    ADD / ADHD Brother      Review of Systems  Constitutional:  Negative for chills and fever.  HENT:  Negative for congestion, ear discharge, ear pain, hearing loss,  sinus pain and sore throat.   Eyes:  Negative for blurred vision and double vision.  Respiratory:  Negative for cough, shortness of breath and wheezing.   Cardiovascular:  Negative for chest pain, palpitations and leg swelling.  Gastrointestinal:  Positive for abdominal pain. Negative for blood in stool, constipation, diarrhea, heartburn, melena, nausea and vomiting.  Genitourinary:  Negative for dysuria, flank pain, frequency, hematuria and urgency.  Musculoskeletal:  Positive for back pain. Negative for joint pain and myalgias.  Skin:  Negative for itching and rash.  Neurological:  Negative for dizziness, tingling, tremors, sensory change, speech change, focal weakness, seizures, loss of consciousness, weakness and headaches.  Endo/Heme/Allergies:  Negative for environmental allergies. Does not bruise/bleed easily.  Psychiatric/Behavioral:  Negative for depression, hallucinations, memory loss, substance abuse and suicidal ideas. The patient is not nervous/anxious and does not have insomnia.      Physical Exam: BP 132/64 (BP Location: Left Arm)   Pulse 91   Temp 98.4 F (36.9 C) (Oral)   Resp 19   LMP 07/23/2022   Constitutional: Well nourished, well developed female in no acute distress.  HEENT: normal Skin: Warm and dry.  Cardiovascular: Regular rate and rhythm.   Extremity:  no edema   Respiratory: Clear to auscultation bilateral. Normal respiratory effort Abdomen: FHT present, soft, mildly tender to palpation upper Back: no  CVAT Psych: Alert and Oriented x3. No memory deficits. Normal mood and affect.    Consults: None  Significant Findings/ Diagnostic Studies: labs:   Latest Reference Range & Units 01/01/23 21:16  Appearance CLEAR  HAZY !  Bilirubin Urine NEGATIVE  NEGATIVE  Color, Urine YELLOW  YELLOW !  Glucose, UA NEGATIVE mg/dL NEGATIVE  Hgb urine dipstick NEGATIVE  NEGATIVE  Ketones, ur NEGATIVE mg/dL NEGATIVE  Leukocytes,Ua NEGATIVE  NEGATIVE  Nitrite NEGATIVE  NEGATIVE  pH 5.0 - 8.0  7.0  Protein NEGATIVE mg/dL NEGATIVE  Specific Gravity, Urine 1.005 - 1.030  1.008  Bacteria, UA NONE SEEN  MANY !  Mucus  PRESENT  RBC / HPF 0 - 5 RBC/hpf 0-5  Squamous Epithelial / HPF 0 - 5 /HPF 0-5  WBC, UA 0 - 5 WBC/hpf 6-10  !: Data is abnormal  Procedures: none  Hospital Course: The patient was admitted to Labor and Delivery Triage for observation.   Discharge Condition: good  Disposition: Discharge disposition: 01-Home or Self Care  Diet: Regular diet  Discharge Activity: Activity as tolerated  Discharge Instructions     Discharge activity:  No Restrictions   Complete by: As directed    Discharge diet:  No restrictions   Complete by: As directed       Allergies as of 01/01/2023       Reactions   Macrobid [nitrofurantoin Macrocrystal] Other (See Comments)   Can not move her body, loses all control of arms legs    Augmentin [amoxicillin-pot Clavulanate] Hives, Swelling   Cephalexin Other (See Comments)   Achy in bones   Penicillins Hives, Swelling   Did it involve swelling of the face/tongue/throat, SOB, or low BP? No Did it involve sudden or severe rash/hives, skin peeling, or any reaction on the inside of your mouth or nose? No Did you need to seek medical attention at a hospital or doctor's office? No When did it last happen?  childhood allergy     If all above answers are "NO", may proceed with cephalosporin use.        Medication List     STOP taking  these medications     aspirin EC 81 MG tablet   ciprofloxacin 500 MG tablet Commonly known as: CIPRO       TAKE these medications    acetaminophen 500 MG tablet Commonly known as: TYLENOL Take 500 mg by mouth every 6 (six) hours as needed.   multivitamin-prenatal 27-0.8 MG Tabs tablet Take 1 tablet by mouth daily at 12 noon.        Follow-up Information     [facility]. Go to.   Why: scheduled appointment Contact information: [address] [place] [address] [phone]                Total time spent taking care of this patient: 20 minutes  Signed: Tresea Mall, CNM  01/01/2023, 9:48 PM

## 2023-01-01 NOTE — OB Triage Note (Signed)
Carla Ingram 27 y.o. presents to Labor & Delivery triage via wheelchair steered by ED staff reporting epigastric and back pain. She is a [id number] at [redacted]w[redacted]d . She denies signs and symptoms consistent with rupture of membranes or active vaginal bleeding. She denies contractions and states positive fetal movement. External FM and TOCO applied to non-tender abdomen. Initial FHR 145. Vital signs obtained and within normal limits. Patient oriented to care environment including call bell and bed control use. Rod Can, CNM notified of patient's arrival. Plan to monitor patient.

## 2023-01-02 ENCOUNTER — Telehealth: Payer: Self-pay

## 2023-01-02 NOTE — Telephone Encounter (Signed)
Patient called Telephone Advice Line after hours with concerns of having upper abdominal pain that was constant and upper back pain x 3 days. She denied fever and decrease in fetal movement. She was advised to got to L&D for evaluation. She reported to L&D and was evaluated by Rod Can. No significant findings. She did have some bacteria in her urine. She was discharged home in good condition. I tried to contact patient to see how she was doing no answer lvm.

## 2023-01-14 ENCOUNTER — Ambulatory Visit: Payer: Medicaid Other | Attending: Maternal & Fetal Medicine

## 2023-01-14 ENCOUNTER — Other Ambulatory Visit: Payer: Self-pay

## 2023-01-14 VITALS — BP 117/67 | HR 97 | Temp 97.7°F | Ht 61.0 in | Wt 217.0 lb

## 2023-01-14 DIAGNOSIS — N83201 Unspecified ovarian cyst, right side: Secondary | ICD-10-CM

## 2023-01-14 DIAGNOSIS — Z8759 Personal history of other complications of pregnancy, childbirth and the puerperium: Secondary | ICD-10-CM

## 2023-01-14 DIAGNOSIS — O99212 Obesity complicating pregnancy, second trimester: Secondary | ICD-10-CM

## 2023-01-14 DIAGNOSIS — Z362 Encounter for other antenatal screening follow-up: Secondary | ICD-10-CM | POA: Diagnosis not present

## 2023-01-14 DIAGNOSIS — E669 Obesity, unspecified: Secondary | ICD-10-CM

## 2023-01-14 DIAGNOSIS — O3482 Maternal care for other abnormalities of pelvic organs, second trimester: Secondary | ICD-10-CM | POA: Diagnosis not present

## 2023-01-14 DIAGNOSIS — O34219 Maternal care for unspecified type scar from previous cesarean delivery: Secondary | ICD-10-CM | POA: Diagnosis not present

## 2023-01-14 DIAGNOSIS — O0992 Supervision of high risk pregnancy, unspecified, second trimester: Secondary | ICD-10-CM

## 2023-01-14 DIAGNOSIS — Z3A25 25 weeks gestation of pregnancy: Secondary | ICD-10-CM | POA: Insufficient documentation

## 2023-01-14 DIAGNOSIS — Z98891 History of uterine scar from previous surgery: Secondary | ICD-10-CM

## 2023-01-14 DIAGNOSIS — O09292 Supervision of pregnancy with other poor reproductive or obstetric history, second trimester: Secondary | ICD-10-CM | POA: Insufficient documentation

## 2023-01-22 ENCOUNTER — Ambulatory Visit (INDEPENDENT_AMBULATORY_CARE_PROVIDER_SITE_OTHER): Payer: Medicaid Other | Admitting: Obstetrics and Gynecology

## 2023-01-22 ENCOUNTER — Encounter: Payer: Self-pay | Admitting: Obstetrics

## 2023-01-22 VITALS — BP 122/68 | HR 89 | Wt 216.0 lb

## 2023-01-22 DIAGNOSIS — Z3A26 26 weeks gestation of pregnancy: Secondary | ICD-10-CM

## 2023-01-22 DIAGNOSIS — O0992 Supervision of high risk pregnancy, unspecified, second trimester: Secondary | ICD-10-CM

## 2023-01-22 LAB — POCT URINALYSIS DIPSTICK OB
Appearance: NORMAL
Bilirubin, UA: NEGATIVE
Blood, UA: NEGATIVE
Glucose, UA: NEGATIVE
Ketones, UA: NEGATIVE
Leukocytes, UA: NEGATIVE
Nitrite, UA: NEGATIVE
POC,PROTEIN,UA: NEGATIVE
Spec Grav, UA: 1.01 (ref 1.010–1.025)
Urobilinogen, UA: 0.2 E.U./dL
pH, UA: 6 (ref 5.0–8.0)

## 2023-01-22 NOTE — Progress Notes (Signed)
ROB: Doing well.  Has occasional abdominal pain on the right but this is intermittent and "not bad".  We have again discussed her right ovarian cyst.  I spoke with Gaynell Face today and he recommends follow-up ultrasound as scheduled.  Nothing further to do.  Possible removal at cesarean delivery term.  Follow-up ultrasound with MFM scheduled.  1 hour GCT next visit.  She has occasional "numbness" in her legs that she believes might be sciatica.  Stretching exercises discussed.

## 2023-02-02 ENCOUNTER — Encounter: Payer: Self-pay | Admitting: Licensed Practical Nurse

## 2023-02-05 ENCOUNTER — Other Ambulatory Visit: Payer: Medicaid Other

## 2023-02-05 ENCOUNTER — Other Ambulatory Visit: Payer: Self-pay | Admitting: Maternal & Fetal Medicine

## 2023-02-05 ENCOUNTER — Encounter: Payer: Medicaid Other | Admitting: Obstetrics and Gynecology

## 2023-02-05 ENCOUNTER — Encounter: Payer: Self-pay | Admitting: Obstetrics and Gynecology

## 2023-02-05 ENCOUNTER — Ambulatory Visit (INDEPENDENT_AMBULATORY_CARE_PROVIDER_SITE_OTHER): Payer: Medicaid Other | Admitting: Obstetrics and Gynecology

## 2023-02-05 VITALS — BP 116/65 | HR 92 | Wt 222.7 lb

## 2023-02-05 DIAGNOSIS — N83299 Other ovarian cyst, unspecified side: Secondary | ICD-10-CM

## 2023-02-05 DIAGNOSIS — O99213 Obesity complicating pregnancy, third trimester: Secondary | ICD-10-CM

## 2023-02-05 DIAGNOSIS — Z130 Encounter for screening for diseases of the blood and blood-forming organs and certain disorders involving the immune mechanism: Secondary | ICD-10-CM | POA: Diagnosis not present

## 2023-02-05 DIAGNOSIS — Z98891 History of uterine scar from previous surgery: Secondary | ICD-10-CM

## 2023-02-05 DIAGNOSIS — Z131 Encounter for screening for diabetes mellitus: Secondary | ICD-10-CM

## 2023-02-05 DIAGNOSIS — E669 Obesity, unspecified: Secondary | ICD-10-CM

## 2023-02-05 DIAGNOSIS — Z23 Encounter for immunization: Secondary | ICD-10-CM | POA: Diagnosis not present

## 2023-02-05 DIAGNOSIS — N83201 Unspecified ovarian cyst, right side: Secondary | ICD-10-CM

## 2023-02-05 DIAGNOSIS — O91119 Abscess of breast associated with pregnancy, unspecified trimester: Secondary | ICD-10-CM

## 2023-02-05 DIAGNOSIS — Z113 Encounter for screening for infections with a predominantly sexual mode of transmission: Secondary | ICD-10-CM

## 2023-02-05 DIAGNOSIS — O99212 Obesity complicating pregnancy, second trimester: Secondary | ICD-10-CM

## 2023-02-05 DIAGNOSIS — O0992 Supervision of high risk pregnancy, unspecified, second trimester: Secondary | ICD-10-CM

## 2023-02-05 DIAGNOSIS — O0991 Supervision of high risk pregnancy, unspecified, first trimester: Secondary | ICD-10-CM | POA: Diagnosis not present

## 2023-02-05 DIAGNOSIS — Z8759 Personal history of other complications of pregnancy, childbirth and the puerperium: Secondary | ICD-10-CM

## 2023-02-05 DIAGNOSIS — Z3A22 22 weeks gestation of pregnancy: Secondary | ICD-10-CM

## 2023-02-05 DIAGNOSIS — Z3A28 28 weeks gestation of pregnancy: Secondary | ICD-10-CM

## 2023-02-05 LAB — POCT URINALYSIS DIPSTICK OB
Bilirubin, UA: NEGATIVE
Blood, UA: NEGATIVE
Glucose, UA: NEGATIVE
Ketones, UA: NEGATIVE
Leukocytes, UA: NEGATIVE
Nitrite, UA: NEGATIVE
Spec Grav, UA: 1.015 (ref 1.010–1.025)
Urobilinogen, UA: 0.2 E.U./dL
pH, UA: 6.5 (ref 5.0–8.0)

## 2023-02-05 NOTE — Progress Notes (Signed)
ROB [redacted]w[redacted]d She is doing well today, she has no new concerns. She has good fetal movement. TDAP and BTC form done today.

## 2023-02-05 NOTE — Progress Notes (Signed)
ROB: Patient is a 27 y.o. [id number] at [redacted]w[redacted]d  She returns for high risk OB care.  Patient reports issues with her left breast. States that she noticed an area several weeks ago on her left breast.  Area got smaller initially, but then started getting larger again and burst.  Is now oozing fluid.  Exam notes 1 x 1 cm area in upper outer quadrant with ruptured lesion, scant oozing of serous fluid, no surrounding erythema. Likely small breast abscess that has ruptured. Denies fevers/chills. Area cleaned and antibiotic ointment placed on region and covered. Discussed wound care. Discussed plans for C-section, will schedule for 04/21/2022. Next growth scan next week with MFM. Discussed need for antenatal testing in late third trimester due to obesity. For 28 week labs today.  Plans to  plans to breastfeed, desires Depo Provera for contraception. For Tdap today, signed blood consent.  RTC in 2 weeks

## 2023-02-05 NOTE — Progress Notes (Signed)
ROB:   To schedule C-section for April 30.

## 2023-02-06 ENCOUNTER — Encounter: Payer: Self-pay | Admitting: Obstetrics and Gynecology

## 2023-02-06 LAB — 28 WEEKS RH-PANEL
Antibody Screen: NEGATIVE
Basophils Absolute: 0 10*3/uL (ref 0.0–0.2)
Basos: 0 %
EOS (ABSOLUTE): 0.2 10*3/uL (ref 0.0–0.4)
Eos: 1 %
Gestational Diabetes Screen: 135 mg/dL (ref 70–139)
HIV Screen 4th Generation wRfx: NONREACTIVE
Hematocrit: 32.3 % — ABNORMAL LOW (ref 34.0–46.6)
Hemoglobin: 11.2 g/dL (ref 11.1–15.9)
Immature Grans (Abs): 0.1 10*3/uL (ref 0.0–0.1)
Immature Granulocytes: 1 %
Lymphocytes Absolute: 2.9 10*3/uL (ref 0.7–3.1)
Lymphs: 25 %
MCH: 32.7 pg (ref 26.6–33.0)
MCHC: 34.7 g/dL (ref 31.5–35.7)
MCV: 94 fL (ref 79–97)
Monocytes Absolute: 0.6 10*3/uL (ref 0.1–0.9)
Monocytes: 5 %
Neutrophils Absolute: 8.1 10*3/uL — ABNORMAL HIGH (ref 1.4–7.0)
Neutrophils: 68 %
Platelets: 324 10*3/uL (ref 150–450)
RBC: 3.43 x10E6/uL — ABNORMAL LOW (ref 3.77–5.28)
RDW: 12.8 % (ref 11.7–15.4)
RPR Ser Ql: NONREACTIVE
WBC: 11.8 10*3/uL — ABNORMAL HIGH (ref 3.4–10.8)

## 2023-02-09 ENCOUNTER — Observation Stay
Admission: EM | Admit: 2023-02-09 | Discharge: 2023-02-09 | Disposition: A | Discharge status: Home / Self Care | Payer: Medicaid Other | Attending: Certified Nurse Midwife | Admitting: Certified Nurse Midwife

## 2023-02-09 ENCOUNTER — Encounter: Payer: Self-pay | Admitting: Obstetrics and Gynecology

## 2023-02-09 ENCOUNTER — Other Ambulatory Visit: Payer: Self-pay

## 2023-02-09 DIAGNOSIS — Z3A28 28 weeks gestation of pregnancy: Secondary | ICD-10-CM | POA: Insufficient documentation

## 2023-02-09 DIAGNOSIS — O368130 Decreased fetal movements, third trimester, not applicable or unspecified: Principal | ICD-10-CM | POA: Insufficient documentation

## 2023-02-09 DIAGNOSIS — Z98891 History of uterine scar from previous surgery: Secondary | ICD-10-CM

## 2023-02-09 DIAGNOSIS — O99212 Obesity complicating pregnancy, second trimester: Secondary | ICD-10-CM

## 2023-02-09 DIAGNOSIS — O0992 Supervision of high risk pregnancy, unspecified, second trimester: Secondary | ICD-10-CM

## 2023-02-09 LAB — URINALYSIS, ROUTINE W REFLEX MICROSCOPIC
Bilirubin Urine: NEGATIVE
Glucose, UA: NEGATIVE mg/dL
Hgb urine dipstick: NEGATIVE
Ketones, ur: NEGATIVE mg/dL
Leukocytes,Ua: NEGATIVE
Nitrite: NEGATIVE
Protein, ur: NEGATIVE mg/dL
Specific Gravity, Urine: 1.003 — ABNORMAL LOW (ref 1.005–1.030)
pH: 7 (ref 5.0–8.0)

## 2023-02-09 MED ORDER — CYCLOBENZAPRINE HCL 5 MG PO TABS
5.0000 mg | ORAL_TABLET | Freq: Once | ORAL | Status: AC
  Administered 2023-02-09: 5 mg via ORAL
  Filled 2023-02-09: qty 1

## 2023-02-09 NOTE — OB Triage Note (Signed)
  L&D OB Triage Note  SUBJECTIVE Carla Ingram is a 27 y.o. [id number] female at [redacted]w[redacted]d EDD Estimated Date of Delivery: 04/29/23 who presented to triage with complaints of decreased fetal movement and musculoskeletal pain that started after doing some cleaning earlier today. She denies contractions, loss of fluid, and vaginal bleeding.    OB History  Gravida Para Term Preterm AB Living  5 4 3 1 $ 0 4  SAB IAB Ectopic Multiple Live Births  0 0 0 0 4    # Outcome Date GA Lbr Len/2nd Weight Sex Delivery Anes PTL Lv  5 Current           4 Term 11/25/19 [id number] g M CS-LTranv Spinal  LIV     Name: Maloney,BOY Alisabeth     Apgar1: 9  Apgar5: 9  3 Term 07/09/16 [id number] g F CS-LTranv Spinal  LIV     Name: SKYLAR     Apgar1: 9  Apgar5: 9  2 Term 01/19/15 39w45w0d05 g F CS-LTranv Spinal  LIV     Name: ALEXIS  1 Preterm 02/02/11 35w031w0d0 g F CS-LTranv Gen  LIV     Complications: Abruptio Placenta    Medications Prior to Admission  Medication Sig Dispense Refill Last Dose   acetaminophen (TYLENOL) 500 MG tablet Take 500 mg by mouth every 6 (six) hours as needed.   02/09/2023   Prenatal Vit-Fe Fumarate-FA (MULTIVITAMIN-PRENATAL) 27-0.8 MG TABS tablet Take 1 tablet by mouth daily at 12 noon.   02/09/2023     OBJECTIVE  Nursing Evaluation:   Ht 5' 1"$  (1.549 m)   Wt 101 kg   LMP 07/23/2022   BMI 42.08 kg/m    Findings:   Reactive NST , no signs of contractions      NST was performed and has been reviewed by me.  NST INTERPRETATION: Category I  Baseline 130 Moderate variability Accelerations present Decelerations absent  Toco: ctx absent    ASSESSMENT Impression:  1.  Pregnancy:  [id number] Estimated Date of Delivery: 04/29/23 2.  Reassuring fetal and maternal status 3. Musculoskeletal pain in pregnancy  PLAN 1. Current condition and above findings reviewed.  Reassuring fetal and maternal condition. 2. Discharge home with standard labor precautions given to  return to L&D or call the office for problems. 3. Continue routine prenatal care.    Devoiry Corriher Philip Aspen

## 2023-02-09 NOTE — OB Triage Note (Signed)
Patient discharged home per order.  She is stable and ambulatory. An After Visit Summary was printed and given to the patient. Discharge education completed with patient and support person including follow up instructions, appointments, and medication list. She received labor and bleeding precautions. Patient able to verbalize understanding. All questions fully answered upon discharge. Patient instructed to return to ED, call 911, or call provider for any changes in condition. Patient discharged home via personal vehicle with support person and all belongings.

## 2023-02-09 NOTE — OB Triage Note (Signed)
Carla Ingram 27 y.o. [id number] at [redacted]w[redacted]d presents to Labor & Delivery triage via wheelchair steered by ED staff reporting no fetal movement since 1600, lower back pain, and left sided abdominal pain that she thought was a pulled muscle. The back pain is 5/10, aching, stabbing, and spasming, brought on by cleaning around 1900. She did not take medication for it. Her 10/10 abdominal pain happens when coughing, sneezing, and laying on that side. She denies UTI or abruption symptoms. She has had 4 prior c/s and has a history of abruption. She denies signs and symptoms consistent with rupture of membranes or active vaginal bleeding. She denies contractions. She has felt vigorous fetal movement since arriving to triage and it has been auscultated and palpated by 2 RNs. External FM and TOCO applied to non-tender abdomen. Initial FHR 145. Vital signs obtained and within normal limits. Patient oriented to care environment including call bell and bed control use. APhilip Aspen CNM notified of patient's arrival. Urine sent to lab. Offered patient flexeril. She would prefer to take it right before leaving d/t history of it making her sleepy. Sister at bedside to drive her home.

## 2023-02-11 ENCOUNTER — Ambulatory Visit: Payer: Medicaid Other | Attending: Obstetrics and Gynecology

## 2023-02-11 ENCOUNTER — Other Ambulatory Visit: Payer: Self-pay

## 2023-02-11 VITALS — BP 128/69 | HR 88 | Temp 97.3°F | Ht 61.0 in | Wt 219.5 lb

## 2023-02-11 DIAGNOSIS — O3483 Maternal care for other abnormalities of pelvic organs, third trimester: Secondary | ICD-10-CM | POA: Insufficient documentation

## 2023-02-11 DIAGNOSIS — O99213 Obesity complicating pregnancy, third trimester: Secondary | ICD-10-CM | POA: Insufficient documentation

## 2023-02-11 DIAGNOSIS — O34219 Maternal care for unspecified type scar from previous cesarean delivery: Secondary | ICD-10-CM | POA: Insufficient documentation

## 2023-02-11 DIAGNOSIS — N83299 Other ovarian cyst, unspecified side: Secondary | ICD-10-CM

## 2023-02-11 DIAGNOSIS — E669 Obesity, unspecified: Secondary | ICD-10-CM | POA: Diagnosis not present

## 2023-02-11 DIAGNOSIS — N83291 Other ovarian cyst, right side: Secondary | ICD-10-CM

## 2023-02-11 DIAGNOSIS — Z3A29 29 weeks gestation of pregnancy: Secondary | ICD-10-CM | POA: Diagnosis not present

## 2023-02-11 DIAGNOSIS — O09293 Supervision of pregnancy with other poor reproductive or obstetric history, third trimester: Secondary | ICD-10-CM | POA: Diagnosis not present

## 2023-02-11 DIAGNOSIS — Z98891 History of uterine scar from previous surgery: Secondary | ICD-10-CM

## 2023-02-11 DIAGNOSIS — O99212 Obesity complicating pregnancy, second trimester: Secondary | ICD-10-CM

## 2023-02-11 DIAGNOSIS — O0993 Supervision of high risk pregnancy, unspecified, third trimester: Secondary | ICD-10-CM

## 2023-02-12 ENCOUNTER — Telehealth: Payer: Self-pay | Admitting: Obstetrics and Gynecology

## 2023-02-12 NOTE — Telephone Encounter (Signed)
Contacted patient regarding recent MFM appointment yesterday.  Ultrasound noting growth of her ovarian cyst previously approximately 14 to 16 cm, now 20 cm.  They discussed concern regarding increased in size.  Discussed recommendations for delivery where GYN oncology services were available in low possibility of malignancy.  I discussed all of these things with the patient.  I also informed her that going to a tertiary care center was not necessary unless patient desired as [facility] also has oncology services can be available and on standby if needed.  Patient notes understanding, states she is fine with continuing her care and delivery at [facility].  I also discussed the possibility of requiring a midline incision instead of a Pfannenstiel due to the growth of her ovarian cyst.  Patient reluctant however does note understanding.  Of note, patient does report that earlier this morning she woke up with some significant abdominal pain.  The pain lasted for approximately an hour, but was not associated with any bleeding.  Still endorses good fetal movement.  Notes pain has since resolved.  Advised on follow-up in office or on L&D if pain occurs again and lasts longer or becomes more intense.  Patient again notes understanding.   Rubie Maid, MD [facility] at [facility]

## 2023-02-19 ENCOUNTER — Encounter: Payer: Self-pay | Admitting: Obstetrics and Gynecology

## 2023-02-19 ENCOUNTER — Telehealth: Payer: Self-pay

## 2023-02-19 ENCOUNTER — Observation Stay
Admission: EM | Admit: 2023-02-19 | Discharge: 2023-02-19 | Disposition: A | Discharge status: Home / Self Care | Payer: Medicaid Other | Attending: Obstetrics | Admitting: Obstetrics

## 2023-02-19 DIAGNOSIS — R102 Pelvic and perineal pain: Secondary | ICD-10-CM | POA: Diagnosis not present

## 2023-02-19 DIAGNOSIS — O09293 Supervision of pregnancy with other poor reproductive or obstetric history, third trimester: Secondary | ICD-10-CM | POA: Diagnosis not present

## 2023-02-19 DIAGNOSIS — O26893 Other specified pregnancy related conditions, third trimester: Principal | ICD-10-CM | POA: Insufficient documentation

## 2023-02-19 DIAGNOSIS — Z3A30 30 weeks gestation of pregnancy: Secondary | ICD-10-CM | POA: Diagnosis not present

## 2023-02-19 LAB — URINALYSIS, COMPLETE (UACMP) WITH MICROSCOPIC
Bilirubin Urine: NEGATIVE
Glucose, UA: NEGATIVE mg/dL
Hgb urine dipstick: NEGATIVE
Ketones, ur: NEGATIVE mg/dL
Leukocytes,Ua: NEGATIVE
Nitrite: NEGATIVE
Protein, ur: NEGATIVE mg/dL
Specific Gravity, Urine: 1.014 (ref 1.005–1.030)
pH: 7 (ref 5.0–8.0)

## 2023-02-19 MED ORDER — LACTATED RINGERS IV SOLN: 500.0000 mL | INTRAVENOUS | Status: DC | PRN

## 2023-02-19 MED ORDER — ACETAMINOPHEN 325 MG PO TABS: 650.0000 mg | ORAL_TABLET | ORAL | Status: DC | PRN

## 2023-02-19 MED ORDER — SOD CITRATE-CITRIC ACID 500-334 MG/5ML PO SOLN: 30.0000 mL | ORAL | Status: DC | PRN

## 2023-02-19 MED ORDER — ONDANSETRON HCL 4 MG/2ML IJ SOLN: 4.0000 mg | Freq: Four times a day (QID) | INTRAMUSCULAR | Status: DC | PRN

## 2023-02-19 NOTE — OB Triage Note (Signed)
LABOR & DELIVERY OB TRIAGE NOTE  SUBJECTIVE  HPI Carla Ingram is a 27 y.o. [id number] at 83w1dwho presents to Labor & Delivery for pelvic pressure. She reports that since this morning, she has been feeling constant pelvic pressure. She denies ctx, abdominal pain, LOF, and vaginal bleeding. She has a mild cough but denies SOB or other respiratory concerns. She was advised by the office CMA to come to triage for evaluation d/t her h/o of placental abruption x2 and large ovarian cyst. During her time in triage, she was able to identify that the pelvic pressure occurred with periods of fetal movement. She did not have any contractions during her triage stay.     OB History     Gravida  5   Para  4   Term  3   Preterm  1   AB  0   Living  4      SAB  0   IAB  0   Ectopic  0   Multiple  0   Live Births  4           Scheduled Meds: Continuous Infusions:  lactated ringers     PRN Meds:.acetaminophen, lactated ringers, ondansetron, sodium citrate-citric acid  OBJECTIVE  BP 122/70 (BP Location: Right Arm)   Pulse 95   Temp 98.4 F (36.9 C) (Oral)   Resp 18   LMP 07/23/2022   General: alert, cooperative, no acute distress Heart: RRR Lungs: CTAB, no increased WOB Abdomen: soft, gravid, non-tender Cervical exam: deferred  NST I reviewed the NST and it was reactive.  Baseline: 130 Variability: moderate Accelerations: present Decelerations:none Toco: no ctx Category 1  ASSESSMENT Impression  1) Pregnancy at [id number] 325w1dEstimated Date of Delivery: 04/29/23 2) Reassuring maternal/fetal status  PLAN 1) Discharge home with standard labor/return precautions 2) Keep scheduled ROB appt in AM  MeLloyd HugerCNM 02/19/23  6:30 PM

## 2023-02-19 NOTE — Telephone Encounter (Signed)
Spoke with pt who sounded like she was having trouble breathing; states is unable to tell if she is having ctxs; the pressure she is having feels like something is pressing down.  Adv pt to go to L&D via ER and wear a mask; Gail in L&D notified.

## 2023-02-20 ENCOUNTER — Ambulatory Visit (INDEPENDENT_AMBULATORY_CARE_PROVIDER_SITE_OTHER): Payer: Medicaid Other | Admitting: Certified Nurse Midwife

## 2023-02-20 ENCOUNTER — Encounter: Payer: Self-pay | Admitting: Certified Nurse Midwife

## 2023-02-20 VITALS — BP 99/65 | HR 101 | Wt 222.0 lb

## 2023-02-20 DIAGNOSIS — Z3493 Encounter for supervision of normal pregnancy, unspecified, third trimester: Secondary | ICD-10-CM | POA: Diagnosis not present

## 2023-02-20 DIAGNOSIS — O0993 Supervision of high risk pregnancy, unspecified, third trimester: Secondary | ICD-10-CM

## 2023-02-20 DIAGNOSIS — Z3A30 30 weeks gestation of pregnancy: Secondary | ICD-10-CM

## 2023-02-20 LAB — POCT URINALYSIS DIPSTICK OB
Bilirubin, UA: NEGATIVE
Blood, UA: NEGATIVE
Glucose, UA: NEGATIVE
Ketones, UA: NEGATIVE
Leukocytes, UA: NEGATIVE
Nitrite, UA: NEGATIVE
POC,PROTEIN,UA: NEGATIVE
Spec Grav, UA: 1.01 (ref 1.010–1.025)
Urobilinogen, UA: 0.2 E.U./dL
pH, UA: 6.5 (ref 5.0–8.0)

## 2023-02-20 NOTE — Progress Notes (Signed)
Body mass index is 41.95 kg/m.  ROB doing well, feeling good movement. Has not concerns today. Had MFM appointment on 2/21 has follow up scheduled. Discussed NST at 36 wks due to elevated BMI. She is in agreement. Follow up 2 wk or prn.   Philip Aspen, CNM

## 2023-02-20 NOTE — Patient Instructions (Signed)
Preterm Labor Pregnancy normally lasts 39-41 weeks. Preterm labor is when labor starts before you have been pregnant for 37 weeks. Babies who are born too early may have a higher risk for long-term problems like cerebral palsy or developmental delays. They may also have problems soon after birth, such as problems with blood sugar, body temperature, heart, and breathing. These problems may be very serious in babies who are born before 31 weeks of pregnancy. What are the causes? The cause of this condition is not known. What increases the risk? You are more likely to have preterm labor if: You have medical problems, now or in the past. You have problems now or in your past pregnancies. You have lifestyle problems. Medical history You have problems of the womb (uterus). You have an infection, including infections you get from sex. You have problems that do not go away, such as: Blood clots. High blood pressure. High blood sugar. You have low body weight or too much body weight. Present and past pregnancies You have had preterm labor before. You are pregnant with two babies or more. You have a condition in which the placenta covers your cervix. You waited less than 18 months between giving birth and becoming pregnant again. Your unborn baby has some problems. You have bleeding from your vagina. You became pregnant by a method called IVF. Lifestyle You smoke. You drink alcohol. You use drugs. You have stress. You have abuse in your home. You come in contact with chemicals that harm the body (pollutants). Other factors You are younger than 17 years or older than 35 years. What are the signs or symptoms? Symptoms of this condition include: Cramps. The cramps may feel like cramps from a period. You may also have watery poop (diarrhea). Pain in the belly (abdomen). Pain in the lower back. Regular contractions. It may feel like your belly is getting tighter. Pressure in the lower  belly. More fluid leaking from the vagina. The fluid may be watery or bloody. Water breaking. How is this treated? Treatment for this condition depends on your health, the health of your baby, and how old your pregnancy is. It may include: Taking medicines, such as: Hormone medicines. Medicines to stop contractions. Medicines to help mature the baby's lungs. Medicines to prevent your baby from getting cerebral palsy or other problems. Bed rest. If the labor happens before 34 weeks of pregnancy, you may need to stay in the hospital. Delivering the baby. Follow these instructions at home:  Do not smoke or use any products that contain nicotine or tobacco. If you need help quitting, ask your doctor. Do not drink alcohol. Take over-the-counter and prescription medicines only as told by your doctor. Rest as told by your doctor. Return to your normal activities when your doctor says that it is safe. Keep all follow-up visits. How is this prevented? To have a healthy pregnancy: Do not use drugs. Do not use any medicines unless you ask your doctor if they are safe for you. Talk with your doctor before taking any herbal supplements. Make sure you gain enough weight. Watch for infection. If you think you might have an infection, get it checked right away. Symptoms of infection may include: Fever. Vaginal discharge that smells bad or is not normal. Pain or burning when you pee. Needing to pee urgently. Needing to pee often. Peeing small amounts often. Blood in your pee. Pee that smells bad or unusual. Where to find more information U.S. Department of Health and [employer]  Office on [employer] Health: [place] [employer] of Obstetricians and Gynecologists: www.acog.org Centers for Disease Control and Prevention: http://www.wolf.info/ Contact a doctor if: You think you are going into preterm labor. You have symptoms of preterm labor. You have symptoms of infection. Get help  right away if: You are having painful contractions every 5 minutes or less. Your water breaks. Summary Preterm labor is labor that starts before you reach 37 weeks of pregnancy. Your baby may have problems if delivered early. You are more likely to have preterm labor if you have certain medical problems or problems with a pregnancy now or in the past. Some lifestyle factors can also increase the risk. Contact a doctor if you have symptoms of preterm labor. This information is not intended to replace advice given to you by your health care provider. Make sure you discuss any questions you have with your health care provider. Document Revised: 12/12/2020 Document Reviewed: 12/12/2020 Elsevier Patient Education  [place].

## 2023-02-22 LAB — URINE CULTURE, OB REFLEX

## 2023-02-22 LAB — CULTURE, OB URINE

## 2023-02-26 ENCOUNTER — Encounter: Payer: Medicaid Other | Admitting: Licensed Practical Nurse

## 2023-03-04 ENCOUNTER — Encounter: Payer: Self-pay | Admitting: Obstetrics and Gynecology

## 2023-03-06 ENCOUNTER — Ambulatory Visit (INDEPENDENT_AMBULATORY_CARE_PROVIDER_SITE_OTHER): Payer: Medicaid Other | Admitting: Obstetrics & Gynecology

## 2023-03-06 ENCOUNTER — Encounter: Payer: Self-pay | Admitting: Advanced Practice Midwife

## 2023-03-06 VITALS — BP 115/69 | Wt 220.0 lb

## 2023-03-06 DIAGNOSIS — E669 Obesity, unspecified: Secondary | ICD-10-CM

## 2023-03-06 DIAGNOSIS — O0993 Supervision of high risk pregnancy, unspecified, third trimester: Secondary | ICD-10-CM

## 2023-03-06 DIAGNOSIS — Z98891 History of uterine scar from previous surgery: Secondary | ICD-10-CM

## 2023-03-06 DIAGNOSIS — O34219 Maternal care for unspecified type scar from previous cesarean delivery: Secondary | ICD-10-CM

## 2023-03-06 DIAGNOSIS — N83201 Unspecified ovarian cyst, right side: Secondary | ICD-10-CM

## 2023-03-06 DIAGNOSIS — O3482 Maternal care for other abnormalities of pelvic organs, second trimester: Secondary | ICD-10-CM

## 2023-03-06 DIAGNOSIS — O99212 Obesity complicating pregnancy, second trimester: Secondary | ICD-10-CM

## 2023-03-06 DIAGNOSIS — O09299 Supervision of pregnancy with other poor reproductive or obstetric history, unspecified trimester: Secondary | ICD-10-CM

## 2023-03-06 DIAGNOSIS — O09293 Supervision of pregnancy with other poor reproductive or obstetric history, third trimester: Secondary | ICD-10-CM

## 2023-03-06 DIAGNOSIS — Z3A32 32 weeks gestation of pregnancy: Secondary | ICD-10-CM

## 2023-03-06 LAB — POCT URINALYSIS DIPSTICK OB
Bilirubin, UA: NEGATIVE
Blood, UA: NEGATIVE
Glucose, UA: NEGATIVE
Ketones, UA: NEGATIVE
Leukocytes, UA: NEGATIVE
Nitrite, UA: NEGATIVE
POC,PROTEIN,UA: NEGATIVE
Spec Grav, UA: 1.025 (ref 1.010–1.025)
Urobilinogen, UA: 0.2 E.U./dL
pH, UA: 6.5 (ref 5.0–8.0)

## 2023-03-06 NOTE — Progress Notes (Addendum)
   PRENATAL VISIT NOTE  Subjective:  Carla Ingram is a 27 y.o. [id number] at [redacted]w[redacted]d being seen today for ongoing prenatal care.  She is currently monitored for the following issues for this high-risk pregnancy and has B12 deficiency; Tobacco use disorder; History of 4 cesarean sections; Ovarian cyst, right; Supervision of high-risk pregnancy; Obesity affecting pregnancy; History of placenta abruption; and Pregnancy with poor obstetric history on their problem list.  Patient reports  chronic cough, still smoking .  Contractions: Not present. Vag. Bleeding: None.  Movement: Present. Denies leaking of fluid.   The following portions of the patient's history were reviewed and updated as appropriate: allergies, current medications, past family history, past medical history, past social history, past surgical history and problem list.   Objective:   Vitals:   03/06/23 1115  BP: 115/69  Weight: 220 lb (99.8 kg)    FHR- 150s  Fetal Status:     Movement: Present     General:  Alert, oriented and cooperative. Patient is in no acute distress.  Skin: Skin is warm and dry. No rash noted.   Cardiovascular: Normal heart rate noted  Respiratory: Normal respiratory effort, no problems with respiration noted  Abdomen: Soft, gravid, appropriate for gestational age.  Pain/Pressure: Present     Pelvic: Cervical exam deferred        Extremities: Normal range of motion.     Mental Status: Normal mood and affect. Normal behavior. Normal judgment and thought content.   Assessment and Plan:  Pregnancy: [id number] at [redacted]w[redacted]d 1. Obesity affecting pregnancy in second trimester, unspecified obesity type - normal glucola - start NST at 37 weeks  2. Supervision of high risk pregnancy in third trimester  - POC Urinalysis Dipstick OB  3. History of 4 cesarean sections - She is scheduled for repeat at 37 weeks at [facility] with gyn onc on call   4. Ovarian cyst, right - gyn onc on call on day of scheduled RLTCS  5.  Pregnancy with poor obstetric history - she had an abruption in the past, doing well now - has repeat ultrasound with MFM scheduled  6. She is considering a BTL  - she signed Medicaid forms today.  Preterm labor symptoms and general obstetric precautions including but not limited to vaginal bleeding, contractions, leaking of fluid and fetal movement were reviewed in detail with the patient. Please refer to After Visit Summary for other counseling recommendations.   Return in about 2 weeks (around 03/20/2023).  Future Appointments  Date Time Provider [place]  03/17/2023 11:00 AM [facility] US1 [facility]  03/20/2023  9:35 AM Rubie Maid, MD [facility] None  04/01/2023  9:00 AM [facility] PAT2 [facility] None  04/02/2023 10:35 AM Rubie Maid, MD [facility] None  04/16/2023 10:55 AM Rubie Maid, MD [facility] None    Emily Filbert, MD

## 2023-03-13 ENCOUNTER — Encounter: Payer: Medicaid Other | Admitting: Obstetrics and Gynecology

## 2023-03-14 ENCOUNTER — Other Ambulatory Visit: Payer: Self-pay

## 2023-03-14 DIAGNOSIS — Z98891 History of uterine scar from previous surgery: Secondary | ICD-10-CM

## 2023-03-14 DIAGNOSIS — O99212 Obesity complicating pregnancy, second trimester: Secondary | ICD-10-CM

## 2023-03-14 DIAGNOSIS — O0993 Supervision of high risk pregnancy, unspecified, third trimester: Secondary | ICD-10-CM

## 2023-03-14 DIAGNOSIS — N83201 Unspecified ovarian cyst, right side: Secondary | ICD-10-CM

## 2023-03-14 DIAGNOSIS — Z8759 Personal history of other complications of pregnancy, childbirth and the puerperium: Secondary | ICD-10-CM

## 2023-03-17 ENCOUNTER — Ambulatory Visit: Payer: Medicaid Other | Attending: Maternal & Fetal Medicine

## 2023-03-17 ENCOUNTER — Other Ambulatory Visit: Payer: Self-pay

## 2023-03-17 DIAGNOSIS — O99213 Obesity complicating pregnancy, third trimester: Secondary | ICD-10-CM | POA: Diagnosis not present

## 2023-03-17 DIAGNOSIS — N83291 Other ovarian cyst, right side: Secondary | ICD-10-CM | POA: Diagnosis not present

## 2023-03-17 DIAGNOSIS — O34219 Maternal care for unspecified type scar from previous cesarean delivery: Secondary | ICD-10-CM | POA: Diagnosis not present

## 2023-03-17 DIAGNOSIS — O99212 Obesity complicating pregnancy, second trimester: Secondary | ICD-10-CM

## 2023-03-17 DIAGNOSIS — Z8759 Personal history of other complications of pregnancy, childbirth and the puerperium: Secondary | ICD-10-CM | POA: Insufficient documentation

## 2023-03-17 DIAGNOSIS — Z98891 History of uterine scar from previous surgery: Secondary | ICD-10-CM

## 2023-03-17 DIAGNOSIS — Z3A33 33 weeks gestation of pregnancy: Secondary | ICD-10-CM | POA: Insufficient documentation

## 2023-03-17 DIAGNOSIS — O3483 Maternal care for other abnormalities of pelvic organs, third trimester: Secondary | ICD-10-CM | POA: Diagnosis not present

## 2023-03-17 DIAGNOSIS — E669 Obesity, unspecified: Secondary | ICD-10-CM

## 2023-03-17 DIAGNOSIS — O0993 Supervision of high risk pregnancy, unspecified, third trimester: Secondary | ICD-10-CM

## 2023-03-17 DIAGNOSIS — N83201 Unspecified ovarian cyst, right side: Secondary | ICD-10-CM | POA: Diagnosis not present

## 2023-03-17 DIAGNOSIS — O09293 Supervision of pregnancy with other poor reproductive or obstetric history, third trimester: Secondary | ICD-10-CM | POA: Diagnosis not present

## 2023-03-18 ENCOUNTER — Ambulatory Visit: Payer: Medicaid Other

## 2023-03-20 ENCOUNTER — Encounter: Payer: Medicaid Other | Admitting: Obstetrics and Gynecology

## 2023-03-20 DIAGNOSIS — O0993 Supervision of high risk pregnancy, unspecified, third trimester: Secondary | ICD-10-CM

## 2023-03-20 DIAGNOSIS — Z3A34 34 weeks gestation of pregnancy: Secondary | ICD-10-CM

## 2023-04-01 ENCOUNTER — Other Ambulatory Visit: Payer: Self-pay

## 2023-04-01 ENCOUNTER — Encounter
Admission: RE | Admit: 2023-04-01 | Discharge: 2023-04-01 | Disposition: A | Discharge status: Home / Self Care | Payer: Medicaid Other | Source: Ambulatory Visit | Attending: Obstetrics and Gynecology | Admitting: Obstetrics and Gynecology

## 2023-04-01 NOTE — Patient Instructions (Addendum)
Your procedure is scheduled on: Wednesday April 07, 2023.  Arrival Time: Please call Labor and Delivery the day before your scheduled C-Section to find out your arrival time. [phone].  Arrival: If your arrival time is prior to 6:00 am, please enter through the Emergency Room Entrance and you will be directed to Labor and Delivery. If your arrival time is 6:00 am or later, please enter the Medical Mall and follow the greeter's instructions.  REMEMBER: Instructions that are not followed completely may result in serious medical risk, up to and including death; or upon the discretion of your surgeon and anesthesiologist your surgery may need to be rescheduled.  Do not eat food or drink fluids after midnight the night before surgery.  No gum chewing or hard candies.   One week prior to surgery: Stop Anti-inflammatories (NSAIDS) such as Advil, Aleve, Ibuprofen, Motrin, Naproxen, Naprosyn and Aspirin based products such as Excedrin, Goody's Powder, BC Powder. Stop ANY OVER THE COUNTER supplements until after surgery. You may however, continue to take Tylenol if needed for pain up until the day of surgery.  Continue taking all prescribed medications with the exception of the following:   Follow recommendations from Cardiologist or PCP regarding stopping blood thinners.  TAKE ONLY THESE MEDICATIONS THE MORNING OF SURGERY WITH A SIP OF WATER:  None   Use inhalers on the day of surgery and bring to the hospital.  No Alcohol for 24 hours before or after surgery.  No Smoking including e-cigarettes for 24 hours prior to surgery.  No chewable tobacco products for at least 6 hours prior to surgery.  No nicotine patches on the day of surgery.  Do not use any "recreational" drugs for at least a week prior to your surgery.  Please be advised that the combination of cocaine and anesthesia may have negative outcomes, up to and including death. If you test positive for cocaine, your surgery will  be cancelled.  On the morning of surgery brush your teeth with toothpaste and water, you may rinse your mouth with mouthwash if you wish. Do not swallow any toothpaste or mouthwash.  Use CHG wipes as directed on instruction sheet.  Do not wear jewelry, make-up, hairpins, clips or nail polish.  Do not wear lotions, powders, or perfumes.   Do not shave body hair from the neck down 48 hours before surgery.  Contact lenses, hearing aids and dentures may not be worn into surgery.  Do not bring valuables to the hospital. [facility] is not responsible for any missing/lost belongings or valuables.    Notify your doctor if there is any change in your medical condition (cold, fever, infection).  Wear comfortable clothing (specific to your surgery type) to the hospital.  After surgery, you can help prevent lung complications by doing breathing exercises.  Take deep breaths and cough every 1-2 hours. Your doctor may order a device called an Incentive Spirometer to help you take deep breaths. When coughing or sneezing, hold a pillow firmly against your incision with both hands. This is called "splinting." Doing this helps protect your incision. It also decreases belly discomfort.  Please call the Pre-admissions Testing Dept. at [phone] if you have any questions about these instructions.  Surgery Visitation Policy:  Visitor Passes   All visitors, including children, need an identification sticker when visiting. These stickers must be worn where they can be seen.   Labor & Delivery  Laboring women may have one designated support person and two other visitors of  any age visit. The support person must remain the same. The visitors may switch with other visitors. Visitation is permitted 24 hours per day. The designated support person or a visitor over the age of 16 may sleep overnight in the patient's room. A doula registered with [facility] for labor and delivery support is not  considered a visitor. Doulas not registered with  are considered visitors.  Mother Baby Unit, OB Specialty and Gynecological Care  A designated support person and three visitors of any age may visit. The three visitors may switch out. The designated support person or a visitor age 80 or older may stay overnight in the room. During the postpartum period (up to 6 weeks), if the mother is the patient, she can have her newborn stay with her if there is another support person present who can be responsible for the baby.    Preparing the Skin Before Surgery     To help prevent the risk of infection at your surgical site, we are now providing you with rinse-free Sage 2% Chlorhexidine Gluconate (CHG) disposable wipes.  Chlorhexidine Gluconate (CHG) Soap  o An antiseptic cleaner that kills germs and bonds with the skin to continue killing germs even after washing  o Used for showering the night before surgery and morning of surgery  The night before surgery: Shower or bathe with warm water. Do not apply perfume, lotions, powders. Wait one hour after shower. Skin should be dry and cool. Open Sage wipe package - use 6 disposable cloths. Wipe body using one cloth for the right arm, one cloth for the left arm, one cloth for the right leg, one cloth for the left leg, one cloth for the chest/abdomen area, and one cloth for the back. Do not use on open wounds or sores. Do not use on face or genitals (private parts). If you are breast feeding, do not use on breasts. 5. Do not rinse, allow to dry. 6. Skin may feel "tacky" for several minutes. 7. Dress in clean clothes. 8. Place clean sheets on your bed and do not sleep with pets.  REPEAT ABOVE ON THE MORNING OF SURGERY BEFORE ARRIVING TO THE HOSPITAL.

## 2023-04-02 ENCOUNTER — Ambulatory Visit (INDEPENDENT_AMBULATORY_CARE_PROVIDER_SITE_OTHER): Payer: Medicaid Other | Admitting: Obstetrics and Gynecology

## 2023-04-02 ENCOUNTER — Other Ambulatory Visit (HOSPITAL_COMMUNITY)
Admission: RE | Admit: 2023-04-02 | Discharge: 2023-04-02 | Disposition: A | Discharge status: Home / Self Care | Payer: Medicaid Other | Source: Ambulatory Visit | Attending: Obstetrics and Gynecology | Admitting: Obstetrics and Gynecology

## 2023-04-02 ENCOUNTER — Encounter: Payer: Self-pay | Admitting: Obstetrics and Gynecology

## 2023-04-02 VITALS — BP 113/67 | HR 85 | Wt 228.5 lb

## 2023-04-02 DIAGNOSIS — Z3A36 36 weeks gestation of pregnancy: Secondary | ICD-10-CM | POA: Insufficient documentation

## 2023-04-02 DIAGNOSIS — O0993 Supervision of high risk pregnancy, unspecified, third trimester: Secondary | ICD-10-CM

## 2023-04-02 DIAGNOSIS — O3483 Maternal care for other abnormalities of pelvic organs, third trimester: Secondary | ICD-10-CM

## 2023-04-02 DIAGNOSIS — O99333 Smoking (tobacco) complicating pregnancy, third trimester: Secondary | ICD-10-CM

## 2023-04-02 DIAGNOSIS — E669 Obesity, unspecified: Secondary | ICD-10-CM

## 2023-04-02 DIAGNOSIS — O99213 Obesity complicating pregnancy, third trimester: Secondary | ICD-10-CM

## 2023-04-02 DIAGNOSIS — Z113 Encounter for screening for infections with a predominantly sexual mode of transmission: Secondary | ICD-10-CM

## 2023-04-02 DIAGNOSIS — F17210 Nicotine dependence, cigarettes, uncomplicated: Secondary | ICD-10-CM

## 2023-04-02 DIAGNOSIS — O34219 Maternal care for unspecified type scar from previous cesarean delivery: Secondary | ICD-10-CM

## 2023-04-02 DIAGNOSIS — N83201 Unspecified ovarian cyst, right side: Secondary | ICD-10-CM

## 2023-04-02 DIAGNOSIS — Z3685 Encounter for antenatal screening for Streptococcus B: Secondary | ICD-10-CM | POA: Diagnosis not present

## 2023-04-02 DIAGNOSIS — Z8759 Personal history of other complications of pregnancy, childbirth and the puerperium: Secondary | ICD-10-CM

## 2023-04-02 DIAGNOSIS — Z98891 History of uterine scar from previous surgery: Secondary | ICD-10-CM

## 2023-04-02 DIAGNOSIS — O321XX0 Maternal care for breech presentation, not applicable or unspecified: Secondary | ICD-10-CM

## 2023-04-02 LAB — POCT URINALYSIS DIPSTICK OB
Bilirubin, UA: NEGATIVE
Blood, UA: NEGATIVE
Glucose, UA: NEGATIVE
Ketones, UA: NEGATIVE
Leukocytes, UA: NEGATIVE
Nitrite, UA: NEGATIVE
Spec Grav, UA: 1.01 (ref 1.010–1.025)
Urobilinogen, UA: 0.2 E.U./dL
pH, UA: 6.5 (ref 5.0–8.0)

## 2023-04-02 NOTE — Progress Notes (Signed)
ROB: Patient is a 27 y.o. [id number] at [redacted]w[redacted]d who presents for routine OB care.  This high risk pregnancy is complicated by tobacco use disorder; History of 4 cesarean sections; Ovarian cyst, right; Obesity affecting pregnancy; History of placenta abruption x 2, breech presentation of fetus. Patient has complaints of heartburn starting this week. Also noting a lot of abdominal pressure and decrease in appetite. Advised that this was likely due to large cyst along with pregnancy. Is scheduled for repeat C/S next week, notes she is strongly leaning towards BTL and has signed papers, but her partner is unsure. Also discussed option of intraoperative PP IUD placement if not 100% sure by the time of her surgery. Discussed expectations for C-section with midline incision, presence of Oncology if needed at delivery for further management of cyst. Fetus is currently breech, noted on most recent growth scan. Patient just worries about pain control as her other C-sections were very easy to recover from. Given reassurance and discussed different methods for pain management during recovery.   Patient thinks she may have a yeast infection and/or UTI. UA today negative for UTI, encouraged hydration and cranberry juice/tablets. Can treat prophylactically with OTC [place] for yeast infection. 36 week cultures performed today. Pre-op done today. No future prenatal visits needed.

## 2023-04-02 NOTE — Progress Notes (Signed)
ROB [redacted]w[redacted]d: She is doing well, feels a little tired. She reports good fetal movement. Her cultures were done today.

## 2023-04-03 LAB — CERVICOVAGINAL ANCILLARY ONLY
Chlamydia: NEGATIVE
Comment: NEGATIVE
Comment: NORMAL
Neisseria Gonorrhea: NEGATIVE

## 2023-04-04 ENCOUNTER — Telehealth: Payer: Self-pay

## 2023-04-06 LAB — STREP GP B CULTURE+RFLX: Strep Gp B Culture+Rflx: NEGATIVE

## 2023-04-06 NOTE — Addendum Note (Signed)
Addended by: Fabian November on: 04/06/2023 10:46 PM   Modules accepted: Orders

## 2023-04-07 ENCOUNTER — Encounter
Admission: RE | Admit: 2023-04-07 | Discharge: 2023-04-07 | Disposition: A | Discharge status: Home / Self Care | Payer: Medicaid Other | Source: Ambulatory Visit | Attending: Obstetrics and Gynecology | Admitting: Obstetrics and Gynecology

## 2023-04-07 DIAGNOSIS — Z01812 Encounter for preprocedural laboratory examination: Secondary | ICD-10-CM | POA: Diagnosis not present

## 2023-04-07 DIAGNOSIS — N83201 Unspecified ovarian cyst, right side: Secondary | ICD-10-CM | POA: Insufficient documentation

## 2023-04-07 DIAGNOSIS — O0992 Supervision of high risk pregnancy, unspecified, second trimester: Secondary | ICD-10-CM | POA: Insufficient documentation

## 2023-04-07 DIAGNOSIS — Z3A00 Weeks of gestation of pregnancy not specified: Secondary | ICD-10-CM | POA: Diagnosis not present

## 2023-04-07 LAB — CBC
HCT: 34.3 % — ABNORMAL LOW (ref 36.0–46.0)
Hemoglobin: 11.7 g/dL — ABNORMAL LOW (ref 12.0–15.0)
MCH: 31.2 pg (ref 26.0–34.0)
MCHC: 34.1 g/dL (ref 30.0–36.0)
MCV: 91.5 fL (ref 80.0–100.0)
Platelets: 336 10*3/uL (ref 150–400)
RBC: 3.75 MIL/uL — ABNORMAL LOW (ref 3.87–5.11)
RDW: 13.8 % (ref 11.5–15.5)
WBC: 11.8 10*3/uL — ABNORMAL HIGH (ref 4.0–10.5)
nRBC: 0 % (ref 0.0–0.2)

## 2023-04-08 LAB — RPR: RPR Ser Ql: NONREACTIVE

## 2023-04-08 LAB — CA 125: Cancer Antigen (CA) 125: 15.8 U/mL (ref 0.0–38.1)

## 2023-04-09 ENCOUNTER — Inpatient Hospital Stay: Payer: Medicaid Other | Admitting: Urgent Care

## 2023-04-09 ENCOUNTER — Inpatient Hospital Stay: Payer: Medicaid Other

## 2023-04-09 ENCOUNTER — Other Ambulatory Visit: Payer: Self-pay

## 2023-04-09 ENCOUNTER — Encounter
Admission: RE | Disposition: A | Discharge status: Home / Self Care | Payer: Self-pay | Source: Ambulatory Visit | Attending: Obstetrics and Gynecology

## 2023-04-09 ENCOUNTER — Inpatient Hospital Stay
Admission: RE | Admit: 2023-04-09 | Discharge: 2023-04-12 | DRG: 786 | Disposition: A | Discharge status: Home / Self Care | Payer: Medicaid Other | Source: Ambulatory Visit | Attending: Obstetrics and Gynecology | Admitting: Obstetrics and Gynecology

## 2023-04-09 ENCOUNTER — Encounter: Payer: Self-pay | Admitting: Obstetrics and Gynecology

## 2023-04-09 DIAGNOSIS — O9902 Anemia complicating childbirth: Secondary | ICD-10-CM | POA: Diagnosis not present

## 2023-04-09 DIAGNOSIS — Z8759 Personal history of other complications of pregnancy, childbirth and the puerperium: Secondary | ICD-10-CM

## 2023-04-09 DIAGNOSIS — R61 Generalized hyperhidrosis: Secondary | ICD-10-CM | POA: Diagnosis not present

## 2023-04-09 DIAGNOSIS — O321XX0 Maternal care for breech presentation, not applicable or unspecified: Secondary | ICD-10-CM | POA: Diagnosis not present

## 2023-04-09 DIAGNOSIS — D270 Benign neoplasm of right ovary: Secondary | ICD-10-CM | POA: Diagnosis present

## 2023-04-09 DIAGNOSIS — Z880 Allergy status to penicillin: Secondary | ICD-10-CM | POA: Diagnosis not present

## 2023-04-09 DIAGNOSIS — Z560 Unemployment, unspecified: Secondary | ICD-10-CM

## 2023-04-09 DIAGNOSIS — O99214 Obesity complicating childbirth: Secondary | ICD-10-CM | POA: Diagnosis not present

## 2023-04-09 DIAGNOSIS — Z30430 Encounter for insertion of intrauterine contraceptive device: Secondary | ICD-10-CM | POA: Diagnosis not present

## 2023-04-09 DIAGNOSIS — N8353 Torsion of ovary, ovarian pedicle and fallopian tube: Secondary | ICD-10-CM | POA: Diagnosis not present

## 2023-04-09 DIAGNOSIS — Z3A37 37 weeks gestation of pregnancy: Secondary | ICD-10-CM | POA: Diagnosis not present

## 2023-04-09 DIAGNOSIS — D62 Acute posthemorrhagic anemia: Secondary | ICD-10-CM | POA: Diagnosis not present

## 2023-04-09 DIAGNOSIS — O9943 Diseases of the circulatory system complicating the puerperium: Secondary | ICD-10-CM

## 2023-04-09 DIAGNOSIS — O3483 Maternal care for other abnormalities of pelvic organs, third trimester: Secondary | ICD-10-CM | POA: Diagnosis not present

## 2023-04-09 DIAGNOSIS — I9581 Postprocedural hypotension: Secondary | ICD-10-CM

## 2023-04-09 DIAGNOSIS — T8119XA Other postprocedural shock, initial encounter: Secondary | ICD-10-CM | POA: Insufficient documentation

## 2023-04-09 DIAGNOSIS — F17210 Nicotine dependence, cigarettes, uncomplicated: Secondary | ICD-10-CM | POA: Diagnosis not present

## 2023-04-09 DIAGNOSIS — O99210 Obesity complicating pregnancy, unspecified trimester: Secondary | ICD-10-CM | POA: Diagnosis present

## 2023-04-09 DIAGNOSIS — O99892 Other specified diseases and conditions complicating childbirth: Secondary | ICD-10-CM | POA: Diagnosis not present

## 2023-04-09 DIAGNOSIS — O0993 Supervision of high risk pregnancy, unspecified, third trimester: Principal | ICD-10-CM

## 2023-04-09 DIAGNOSIS — N83201 Unspecified ovarian cyst, right side: Secondary | ICD-10-CM | POA: Diagnosis present

## 2023-04-09 DIAGNOSIS — O0990 Supervision of high risk pregnancy, unspecified, unspecified trimester: Secondary | ICD-10-CM

## 2023-04-09 DIAGNOSIS — Z98891 History of uterine scar from previous surgery: Principal | ICD-10-CM

## 2023-04-09 DIAGNOSIS — O09293 Supervision of pregnancy with other poor reproductive or obstetric history, third trimester: Secondary | ICD-10-CM

## 2023-04-09 DIAGNOSIS — O99334 Smoking (tobacco) complicating childbirth: Secondary | ICD-10-CM | POA: Diagnosis not present

## 2023-04-09 DIAGNOSIS — O321XX1 Maternal care for breech presentation, fetus 1: Secondary | ICD-10-CM

## 2023-04-09 DIAGNOSIS — O34211 Maternal care for low transverse scar from previous cesarean delivery: Principal | ICD-10-CM | POA: Diagnosis present

## 2023-04-09 DIAGNOSIS — R140 Abdominal distension (gaseous): Secondary | ICD-10-CM | POA: Diagnosis not present

## 2023-04-09 DIAGNOSIS — O99212 Obesity complicating pregnancy, second trimester: Secondary | ICD-10-CM

## 2023-04-09 HISTORY — PX: OOPHORECTOMY: SHX6387

## 2023-04-09 LAB — BPAM RBC
Blood Product Expiration Date: 202405252359
ISSUE DATE / TIME: 202404171955

## 2023-04-09 LAB — CBC
HCT: 27.5 % — ABNORMAL LOW (ref 36.0–46.0)
HCT: 24 % — ABNORMAL LOW (ref 36.0–46.0)
HCT: 23.3 % — ABNORMAL LOW (ref 36.0–46.0)
Hemoglobin: 9.4 g/dL — ABNORMAL LOW (ref 12.0–15.0)
Hemoglobin: 8.1 g/dL — ABNORMAL LOW (ref 12.0–15.0)
Hemoglobin: 7.9 g/dL — ABNORMAL LOW (ref 12.0–15.0)
MCH: 31.8 pg (ref 26.0–34.0)
MCH: 31.5 pg (ref 26.0–34.0)
MCH: 31.9 pg (ref 26.0–34.0)
MCHC: 34.2 g/dL (ref 30.0–36.0)
MCHC: 33.8 g/dL (ref 30.0–36.0)
MCHC: 33.9 g/dL (ref 30.0–36.0)
MCV: 92.9 fL (ref 80.0–100.0)
MCV: 93.4 fL (ref 80.0–100.0)
MCV: 94 fL (ref 80.0–100.0)
Platelets: 304 10*3/uL (ref 150–400)
Platelets: 299 10*3/uL (ref 150–400)
Platelets: 304 10*3/uL (ref 150–400)
RBC: 2.96 MIL/uL — ABNORMAL LOW (ref 3.87–5.11)
RBC: 2.57 MIL/uL — ABNORMAL LOW (ref 3.87–5.11)
RBC: 2.48 MIL/uL — ABNORMAL LOW (ref 3.87–5.11)
RDW: 13.9 % (ref 11.5–15.5)
RDW: 14.1 % (ref 11.5–15.5)
RDW: 14.1 % (ref 11.5–15.5)
WBC: 19.1 10*3/uL — ABNORMAL HIGH (ref 4.0–10.5)
WBC: 17.4 10*3/uL — ABNORMAL HIGH (ref 4.0–10.5)
WBC: 17.9 10*3/uL — ABNORMAL HIGH (ref 4.0–10.5)
nRBC: 0 % (ref 0.0–0.2)
nRBC: 0 % (ref 0.0–0.2)
nRBC: 0 % (ref 0.0–0.2)

## 2023-04-09 LAB — TYPE AND SCREEN
Unit division: 0
Unit division: 0

## 2023-04-09 LAB — COMPREHENSIVE METABOLIC PANEL
ALT: 13 U/L (ref 0–44)
AST: 23 U/L (ref 15–41)
Albumin: 1.9 g/dL — ABNORMAL LOW (ref 3.5–5.0)
Alkaline Phosphatase: 78 U/L (ref 38–126)
Anion gap: 5 (ref 5–15)
BUN: 8 mg/dL (ref 6–20)
CO2: 20 mmol/L — ABNORMAL LOW (ref 22–32)
Calcium: 7.7 mg/dL — ABNORMAL LOW (ref 8.9–10.3)
Chloride: 110 mmol/L (ref 98–111)
Creatinine, Ser: 0.65 mg/dL (ref 0.44–1.00)
GFR, Estimated: >60 mL/min (ref >60)
Glucose, Bld: 131 mg/dL — ABNORMAL HIGH (ref 70–99)
Potassium: 4.3 mmol/L (ref 3.5–5.1)
Sodium: 135 mmol/L (ref 135–145)
Total Bilirubin: 0.4 mg/dL (ref 0.3–1.2)
Total Protein: 4.2 g/dL — ABNORMAL LOW (ref 6.5–8.1)

## 2023-04-09 LAB — PREPARE RBC (CROSSMATCH)

## 2023-04-09 LAB — GLUCOSE, CAPILLARY: Glucose-Capillary: 127 mg/dL — ABNORMAL HIGH (ref 70–99)

## 2023-04-09 SURGERY — Surgical Case
Anesthesia: Spinal | Laterality: Right

## 2023-04-09 MED ORDER — LACTATED RINGERS IV BOLUS
1000.0000 mL | Freq: Once | INTRAVENOUS | Status: AC
  Administered 2023-04-09: 1000 mL via INTRAVENOUS

## 2023-04-09 MED ORDER — OXYTOCIN-SODIUM CHLORIDE 30-0.9 UT/500ML-% IV SOLN
INTRAVENOUS | Status: DC | PRN
  Administered 2023-04-09: 30 [IU] via INTRAVENOUS

## 2023-04-09 MED ORDER — CHLORHEXIDINE GLUCONATE 0.12 % MT SOLN
15.0000 mL | Freq: Once | OROMUCOSAL | Status: AC
  Administered 2023-04-09: 15 mL via OROMUCOSAL
  Filled 2023-04-09: qty 15

## 2023-04-09 MED ORDER — KETOROLAC TROMETHAMINE 30 MG/ML IJ SOLN
30.0000 mg | Freq: Four times a day (QID) | INTRAMUSCULAR | Status: DC
  Administered 2023-04-09: 30 mg via INTRAVENOUS
  Filled 2023-04-09: qty 1

## 2023-04-09 MED ORDER — SIMETHICONE 80 MG PO CHEW: 80.0000 mg | CHEWABLE_TABLET | ORAL | Status: DC | PRN

## 2023-04-09 MED ORDER — LEVONORGESTREL 20 MCG/DAY IU IUD
1.0000 | INTRAUTERINE_SYSTEM | Freq: Once | INTRAUTERINE | Status: AC
  Administered 2023-04-09: 1 via INTRAUTERINE
  Filled 2023-04-09: qty 1

## 2023-04-09 MED ORDER — DIPHENHYDRAMINE HCL 25 MG PO CAPS
25.0000 mg | ORAL_CAPSULE | Freq: Once | ORAL | Status: AC
  Administered 2023-04-09: 25 mg via ORAL
  Filled 2023-04-09: qty 1

## 2023-04-09 MED ORDER — DIBUCAINE (PERIANAL) 1 % EX OINT: 1.0000 | TOPICAL_OINTMENT | CUTANEOUS | Status: DC | PRN

## 2023-04-09 MED ORDER — TRAMADOL HCL 50 MG PO TABS: 50.0000 mg | ORAL_TABLET | Freq: Four times a day (QID) | ORAL | Status: DC | PRN

## 2023-04-09 MED ORDER — ONDANSETRON HCL 4 MG/2ML IJ SOLN
INTRAMUSCULAR | Status: DC | PRN
  Administered 2023-04-09: 4 mg via INTRAVENOUS

## 2023-04-09 MED ORDER — PHENYLEPHRINE HCL (PRESSORS) 10 MG/ML IV SOLN
INTRAVENOUS | Status: AC
  Filled 2023-04-09: qty 1

## 2023-04-09 MED ORDER — SENNOSIDES-DOCUSATE SODIUM 8.6-50 MG PO TABS
2.0000 | ORAL_TABLET | Freq: Every day | ORAL | Status: DC
  Administered 2023-04-10 – 2023-04-12 (×3): 2 via ORAL
  Filled 2023-04-09 (×3): qty 2

## 2023-04-09 MED ORDER — OXYCODONE HCL 5 MG PO TABS: 5.0000 mg | ORAL_TABLET | ORAL | Status: AC | PRN

## 2023-04-09 MED ORDER — COCONUT OIL OIL
1.0000 | TOPICAL_OIL | Status: DC | PRN
  Filled 2023-04-09: qty 7.5

## 2023-04-09 MED ORDER — SODIUM CHLORIDE 0.9% IV SOLUTION: Freq: Once | INTRAVENOUS | Status: AC

## 2023-04-09 MED ORDER — GABAPENTIN 100 MG PO CAPS
200.0000 mg | ORAL_CAPSULE | Freq: Three times a day (TID) | ORAL | Status: DC
  Administered 2023-04-09 – 2023-04-10 (×2): 200 mg via ORAL
  Filled 2023-04-09 (×3): qty 2

## 2023-04-09 MED ORDER — MEPERIDINE HCL 25 MG/ML IJ SOLN: 6.2500 mg | INTRAMUSCULAR | Status: DC | PRN

## 2023-04-09 MED ORDER — MORPHINE SULFATE (PF) 0.5 MG/ML IJ SOLN
INTRAMUSCULAR | Status: DC | PRN
  Administered 2023-04-09: .1 mg via INTRATHECAL

## 2023-04-09 MED ORDER — SIMETHICONE 80 MG PO CHEW
80.0000 mg | CHEWABLE_TABLET | Freq: Three times a day (TID) | ORAL | Status: DC
  Administered 2023-04-09 – 2023-04-11 (×7): 80 mg via ORAL
  Filled 2023-04-09 (×7): qty 1

## 2023-04-09 MED ORDER — SOD CITRATE-CITRIC ACID 500-334 MG/5ML PO SOLN
ORAL | Status: AC
  Administered 2023-04-09: 30 mL via ORAL
  Filled 2023-04-09: qty 15

## 2023-04-09 MED ORDER — LACTATED RINGERS IV SOLN: INTRAVENOUS | Status: DC

## 2023-04-09 MED ORDER — FENTANYL CITRATE (PF) 100 MCG/2ML IJ SOLN
INTRAMUSCULAR | Status: AC
  Filled 2023-04-09: qty 2

## 2023-04-09 MED ORDER — OXYCODONE HCL 5 MG/5ML PO SOLN: 5.0000 mg | Freq: Once | ORAL | Status: DC | PRN

## 2023-04-09 MED ORDER — ACETAMINOPHEN 325 MG PO TABS
650.0000 mg | ORAL_TABLET | Freq: Four times a day (QID) | ORAL | Status: AC
  Administered 2023-04-09 – 2023-04-10 (×3): 650 mg via ORAL
  Filled 2023-04-09 (×4): qty 2

## 2023-04-09 MED ORDER — FERROUS SULFATE 325 (65 FE) MG PO TABS
325.0000 mg | ORAL_TABLET | Freq: Every day | ORAL | Status: DC
  Administered 2023-04-10 – 2023-04-12 (×3): 325 mg via ORAL
  Filled 2023-04-09 (×3): qty 1

## 2023-04-09 MED ORDER — DIPHENHYDRAMINE HCL 50 MG/ML IJ SOLN: 12.5000 mg | INTRAMUSCULAR | Status: DC | PRN

## 2023-04-09 MED ORDER — WITCH HAZEL-GLYCERIN EX PADS: 1.0000 | MEDICATED_PAD | CUTANEOUS | Status: DC | PRN

## 2023-04-09 MED ORDER — ZOLPIDEM TARTRATE 5 MG PO TABS: 5.0000 mg | ORAL_TABLET | Freq: Every evening | ORAL | Status: DC | PRN

## 2023-04-09 MED ORDER — MENTHOL 3 MG MT LOZG: 1.0000 | LOZENGE | OROMUCOSAL | Status: DC | PRN

## 2023-04-09 MED ORDER — MORPHINE SULFATE (PF) 0.5 MG/ML IJ SOLN
INTRAMUSCULAR | Status: AC
  Filled 2023-04-09: qty 10

## 2023-04-09 MED ORDER — GENTAMICIN SULFATE 40 MG/ML IJ SOLN
5.0000 mg/kg | INTRAVENOUS | Status: AC
  Administered 2023-04-09: 510 mg via INTRAVENOUS
  Filled 2023-04-09: qty 12.75

## 2023-04-09 MED ORDER — ACETAMINOPHEN 500 MG PO TABS
1000.0000 mg | ORAL_TABLET | ORAL | Status: AC
  Administered 2023-04-09: 1000 mg via ORAL
  Filled 2023-04-09: qty 2

## 2023-04-09 MED ORDER — FENTANYL CITRATE (PF) 100 MCG/2ML IJ SOLN: 25.0000 ug | INTRAMUSCULAR | Status: DC | PRN

## 2023-04-09 MED ORDER — SOD CITRATE-CITRIC ACID 500-334 MG/5ML PO SOLN: 30.0000 mL | ORAL | Status: AC

## 2023-04-09 MED ORDER — LIDOCAINE 5 % EX PTCH
MEDICATED_PATCH | CUTANEOUS | Status: DC | PRN
  Administered 2023-04-09: 1 via TRANSDERMAL

## 2023-04-09 MED ORDER — HYDROMORPHONE HCL 1 MG/ML IJ SOLN: 0.2000 mg | INTRAMUSCULAR | Status: DC | PRN

## 2023-04-09 MED ORDER — OXYTOCIN-SODIUM CHLORIDE 30-0.9 UT/500ML-% IV SOLN
2.5000 [IU]/h | INTRAVENOUS | Status: AC
  Administered 2023-04-09: 2.5 [IU]/h via INTRAVENOUS

## 2023-04-09 MED ORDER — OXYTOCIN-SODIUM CHLORIDE 30-0.9 UT/500ML-% IV SOLN
INTRAVENOUS | Status: AC
  Filled 2023-04-09: qty 500

## 2023-04-09 MED ORDER — NALOXONE HCL 0.4 MG/ML IJ SOLN: 0.4000 mg | INTRAMUSCULAR | Status: DC | PRN

## 2023-04-09 MED ORDER — PHENYLEPHRINE HCL-NACL 20-0.9 MG/250ML-% IV SOLN
INTRAVENOUS | Status: DC | PRN
  Administered 2023-04-09: 40 ug/min via INTRAVENOUS

## 2023-04-09 MED ORDER — CLINDAMYCIN PHOSPHATE 900 MG/50ML IV SOLN
900.0000 mg | INTRAVENOUS | Status: AC
  Administered 2023-04-09: 900 mg via INTRAVENOUS
  Filled 2023-04-09: qty 50

## 2023-04-09 MED ORDER — LIDOCAINE 5 % EX PTCH
MEDICATED_PATCH | CUTANEOUS | Status: AC
  Filled 2023-04-09: qty 1

## 2023-04-09 MED ORDER — IOHEXOL 300 MG/ML  SOLN
100.0000 mL | Freq: Once | INTRAMUSCULAR | Status: AC | PRN
  Administered 2023-04-09: 100 mL via INTRAVENOUS

## 2023-04-09 MED ORDER — DIPHENHYDRAMINE HCL 25 MG PO CAPS: 25.0000 mg | ORAL_CAPSULE | ORAL | Status: DC | PRN

## 2023-04-09 MED ORDER — FUROSEMIDE 10 MG/ML IJ SOLN
20.0000 mg | Freq: Once | INTRAMUSCULAR | Status: AC
  Administered 2023-04-09: 20 mg via INTRAVENOUS
  Filled 2023-04-09: qty 2

## 2023-04-09 MED ORDER — PRENATAL MULTIVITAMIN CH
1.0000 | ORAL_TABLET | Freq: Every day | ORAL | Status: DC
  Administered 2023-04-09 – 2023-04-11 (×3): 1 via ORAL
  Filled 2023-04-09 (×3): qty 1

## 2023-04-09 MED ORDER — OXYCODONE HCL 5 MG PO TABS: 5.0000 mg | ORAL_TABLET | Freq: Once | ORAL | Status: DC | PRN

## 2023-04-09 MED ORDER — POVIDONE-IODINE 10 % EX SWAB
2.0000 | Freq: Once | CUTANEOUS | Status: AC
  Administered 2023-04-09: 2 via TOPICAL

## 2023-04-09 MED ORDER — LACTATED RINGERS IV SOLN: Freq: Once | INTRAVENOUS | Status: AC

## 2023-04-09 MED ORDER — BUPIVACAINE IN DEXTROSE 0.75-8.25 % IT SOLN
INTRATHECAL | Status: DC | PRN
  Administered 2023-04-09: 1.6 mL via INTRATHECAL

## 2023-04-09 MED ORDER — NALOXONE HCL 4 MG/10ML IJ SOLN: 1.0000 ug/kg/h | INTRAVENOUS | Status: DC | PRN

## 2023-04-09 MED ORDER — ONDANSETRON HCL 4 MG/2ML IJ SOLN
4.0000 mg | Freq: Three times a day (TID) | INTRAMUSCULAR | Status: DC | PRN
  Administered 2023-04-09: 4 mg via INTRAVENOUS
  Filled 2023-04-09: qty 2

## 2023-04-09 MED ORDER — ORAL CARE MOUTH RINSE: 15.0000 mL | Freq: Once | OROMUCOSAL | Status: AC

## 2023-04-09 MED ORDER — KETOROLAC TROMETHAMINE 30 MG/ML IJ SOLN: 30.0000 mg | Freq: Four times a day (QID) | INTRAMUSCULAR | Status: DC

## 2023-04-09 MED ORDER — ACETAMINOPHEN 325 MG PO TABS
650.0000 mg | ORAL_TABLET | Freq: Once | ORAL | Status: AC
  Administered 2023-04-09: 650 mg via ORAL
  Filled 2023-04-09: qty 2

## 2023-04-09 MED ORDER — SODIUM CHLORIDE 0.9% FLUSH: 3.0000 mL | INTRAVENOUS | Status: DC | PRN

## 2023-04-09 MED ORDER — DEXAMETHASONE SODIUM PHOSPHATE 10 MG/ML IJ SOLN
INTRAMUSCULAR | Status: DC | PRN
  Administered 2023-04-09: 10 mg via INTRAVENOUS

## 2023-04-09 MED ORDER — GABAPENTIN 300 MG PO CAPS
300.0000 mg | ORAL_CAPSULE | ORAL | Status: AC
  Administered 2023-04-09: 300 mg via ORAL
  Filled 2023-04-09: qty 1

## 2023-04-09 MED ORDER — FENTANYL CITRATE (PF) 100 MCG/2ML IJ SOLN
INTRAMUSCULAR | Status: DC | PRN
  Administered 2023-04-09: 15 ug via INTRATHECAL

## 2023-04-09 SURGICAL SUPPLY — 37 items
ADH SKN CLS APL DERMABOND .7 (GAUZE/BANDAGES/DRESSINGS) ×2
APL PRP STRL LF DISP 70% ISPRP (MISCELLANEOUS) ×4
BAG COUNTER SPONGE SURGICOUNT (BAG) ×2 IMPLANT
BAG SPNG CNTER NS LX DISP (BAG) ×2
BNDG TENSOPLAST 6X5 (GAUZE/BANDAGES/DRESSINGS) IMPLANT
CHLORAPREP W/TINT 26 (MISCELLANEOUS) ×4 IMPLANT
DERMABOND ADVANCED .7 DNX12 (GAUZE/BANDAGES/DRESSINGS) IMPLANT
DRSG TELFA 3X8 NADH STRL (GAUZE/BANDAGES/DRESSINGS) ×2 IMPLANT
ELECT REM PT RETURN 9FT ADLT (ELECTROSURGICAL) ×2
ELECTRODE REM PT RTRN 9FT ADLT (ELECTROSURGICAL) ×2 IMPLANT
EXTRT SYSTEM ALEXIS 17CM (MISCELLANEOUS)
GAUZE SPONGE 4X4 12PLY STRL (GAUZE/BANDAGES/DRESSINGS) ×2 IMPLANT
GLOVE BIO SURGEON STRL SZ 6.5 (GLOVE) ×2 IMPLANT
GLOVE INDICATOR 7.0 STRL GRN (GLOVE) ×2 IMPLANT
GOWN STRL REUS W/ TWL LRG LVL3 (GOWN DISPOSABLE) ×4 IMPLANT
GOWN STRL REUS W/TWL LRG LVL3 (GOWN DISPOSABLE) ×4
KIT TURNOVER KIT A (KITS) ×2 IMPLANT
LIGASURE IMPACT 36 18CM CVD LR (INSTRUMENTS) IMPLANT
MANIFOLD NEPTUNE II (INSTRUMENTS) ×2 IMPLANT
MAT PREVALON FULL STRYKER (MISCELLANEOUS) ×2 IMPLANT
NS IRRIG 1000ML POUR BTL (IV SOLUTION) ×2 IMPLANT
PACK C SECTION AR (MISCELLANEOUS) ×2 IMPLANT
PAD OB MATERNITY 4.3X12.25 (PERSONAL CARE ITEMS) ×2 IMPLANT
PAD PREP 24X41 OB/GYN DISP (PERSONAL CARE ITEMS) ×2 IMPLANT
SCRUB CHG 4% DYNA-HEX 4OZ (MISCELLANEOUS) ×2 IMPLANT
STAPLER INSORB 30 2030 C-SECTI (MISCELLANEOUS) IMPLANT
SUT MNCRL AB 4-0 PS2 18 (SUTURE) ×2 IMPLANT
SUT PLAIN 2 0 XLH (SUTURE) IMPLANT
SUT VIC AB 0 CT1 36 (SUTURE) ×8 IMPLANT
SUT VIC AB 3-0 SH 27 (SUTURE) ×2
SUT VIC AB 3-0 SH 27X BRD (SUTURE) ×2 IMPLANT
SUT VICRYL 3-0 36IN CTB-1 (SUTURE) IMPLANT
SYS MIRENA INTRAUTERINE (MISCELLANEOUS) ×2
SYSTEM CONTND EXTRCTN KII BLLN (MISCELLANEOUS) IMPLANT
SYSTEM MIRENA INTRAUTERINE (MISCELLANEOUS) IMPLANT
TRAP FLUID SMOKE EVACUATOR (MISCELLANEOUS) ×2 IMPLANT
WATER STERILE IRR 500ML POUR (IV SOLUTION) ×2 IMPLANT

## 2023-04-09 NOTE — Anesthesia Procedure Notes (Signed)
Spinal  Patient location during procedure: OR Reason for block: surgical anesthesia Staffing Performed: resident/CRNA  Resident/CRNA: Gardiner Espana, CRNA Performed by: Naela Nodal, CRNA Authorized by: Piscitello, Joseph K, MD   Preanesthetic Checklist Completed: patient identified, IV checked, site marked, risks and benefits discussed, surgical consent, monitors and equipment checked, pre-op evaluation and timeout performed Spinal Block Patient position: sitting Prep: ChloraPrep and site prepped and draped Patient monitoring: heart rate, continuous pulse ox, blood pressure and cardiac monitor Approach: midline Location: L4-5 Injection technique: single-shot Needle Needle type: Whitacre and Introducer  Needle gauge: 24 G Needle length: 9 cm Assessment Sensory level: T4 Events: CSF return Additional Notes Negative paresthesia. Negative blood return. Positive free-flowing CSF. Expiration date of kit checked and confirmed. Patient tolerated procedure well, without complications.       

## 2023-04-09 NOTE — Progress Notes (Signed)
OB/GYN Attending Progress Note  Contacted regarding CT scan results. Moderate amount of intraperiotoneal bleed in pelvis.  Patient's hemoglobin appears to be at a plateau at this time. All other labs stable including creatinine.  Vitals remain relatively stable (low normal BP but not hypotensive). Patient overall reports feeling better.  Will hold on surgery for now as appears that bleeding may have tamponaded. Continue to monitor serial H/H's exams.  Discussed blood transfusion for patient, patient ok to receive. Will transfuse 1 unit, additional 1 unit on hold.    Vitals:  Vitals:   04/09/23 1905 04/09/23 2002 04/09/23 2025 04/09/23 2115  BP: (!) 102/58 (!) 103/59 (!) 110/59 109/61  Pulse: 94 91 90 90  Temp: 97.7 F (36.5 C) 98 F (36.7 C) 98.4 F (36.9 C)   Resp: Height:      Weight:      SpO2: 99% 99% 99% 100%  TempSrc: Oral Oral Oral   BMI (Calculated):          Labs:     Latest Ref Rng & Units 04/09/2023    5:25 PM 04/09/2023    3:28 PM 04/09/2023    1:37 PM  CBC  WBC 4.0 - 10.5 K/uL 17.9  17.4  19.1   Hemoglobin 12.0 - 15.0 g/dL 7.9  8.1  9.4   Hematocrit 36.0 - 46.0 % 23.3  24.0  27.5   Platelets 150 - 400 K/uL 304  299  304        Latest Ref Rng & Units 04/09/2023    5:25 PM  CMP  Glucose 70 - 99 mg/dL 098   BUN 6 - 20 mg/dL 8   Creatinine 1.19 - 1.47 mg/dL 8.29   Sodium 562 - 130 mmol/L 135   Potassium 3.5 - 5.1 mmol/L 4.3   Chloride 98 - 111 mmol/L 110   CO2 22 - 32 mmol/L 20   Calcium 8.9 - 10.3 mg/dL 7.7   Total Protein 6.5 - 8.1 g/dL 4.2   Total Bilirubin 0.3 - 1.2 mg/dL 0.4   Alkaline Phos 38 - 126 U/L 78   AST 15 - 41 U/L 23   ALT 0 - 44 U/L 13       Hildred Laser, MD No Name OB/GYN

## 2023-04-09 NOTE — Progress Notes (Signed)
OB/GYN ATTENDING PROGRESS NOTE  Called from clinic to assess patient due to hypotension with syncopal episode. Syncope lasted between 30-60 seconds. Bps noted to be 60's/40s. Ordered stat CBC and 1L IVF bolus. Rapid Response team had been notified. Upon my arrival, patient in Trendelenburg position, holding a fan with moist cloth over forehead. Appears responsive and smiling upon my entry into the room. Notes she felt light headed after drinking a soda and then awoke to "multiple people in my room". Anesthesiology previously in the room prior to my arrival, administered a dose of ephedrine prior to my arrival. Repeat BP 90's/50s. Incision site dressing clean/dry/intact. Abdomen appropriately tender at incision site, no guarding or rebound noted. Minimal vaginal bleeding noted on pad. Continue current management at this time.    Hildred Laser, MD  OB/GYN at [facility]

## 2023-04-09 NOTE — Transfer of Care (Signed)
Immediate Anesthesia Transfer of Care Note  Patient: Carla Ingram  Procedure(s) Performed: REPEAT CESAREAN SECTION  Patient Location: Mother/Baby  Anesthesia Type:Spinal  Level of Consciousness: awake, alert , and oriented  Airway & Oxygen Therapy: Patient Spontanous Breathing  Post-op Assessment: Report given to RN and Post -op Vital signs reviewed and stable  Post vital signs: Reviewed  Last Vitals:  Vitals Value Taken Time  BP 106/75 04/09/23 0928  Temp 36.4 C 04/09/23 0928  Pulse 77 04/09/23 0928  Resp 17 04/09/23 0928  SpO2 97 % 04/09/23 0928    Last Pain:  Vitals:   04/09/23 0545  TempSrc: Oral         Complications: No notable events documented.

## 2023-04-09 NOTE — Op Note (Addendum)
Cesarean Section Procedure Note  Indications:  History of prior C-section x 4, history of placental abruption x 2, large right adnexal mass, ~ 22 cm on most recent imaging.  Pre-operative Diagnosis: 37 week 1 day pregnancy, history of prior C-section x 4, history of placental abruption x 2, large right adnexal mass, ~ 22 cm, morbid obesity (BMI 43), breech presentation of fetus, desiring immediate postpartum contraception.  Post-operative Diagnosis: Same  Surgeon: Hildred Laser, MD  Assistants:  Brennan Bailey, MD  Procedure: Repeat low transverse Cesarean Section (vertical skin incision), excision of right adnexal mass with ovary, and immediate postpartum IUD insertion  Anesthesia: Spinal  Findings: Female infant, complete breech presentation, 2600 grams, with Apgar scores of 8 at one minute and 9 at five minutes. Intact placenta with 3 vessel cord.  Clear amniotic fluid at amniotomy The uterine outline, left tube and ovary appeared normal.  Right ovary with large cystic mass encompassing the ovary, fluid-filled with appearance of serous cystadenoma.  Normal-appearing right fallopian tube.   Procedure Details: The patient was seen in the Holding Room. The risks, benefits, complications, treatment options, and expected outcomes were discussed with the patient.  The patient concurred with the proposed plan, giving informed consent.  The site of surgery properly noted/marked. The patient was taken to the Operating Room, identified as Carla Ingram and the procedure verified as C-Section Delivery. A Time Out was held and the above information confirmed.  After induction of anesthesia, the patient was draped and prepped in the usual sterile manner. Anesthesia was tested and noted to be adequate. A vertical midline infraumbilical incision was made and carried down through the subcutaneous tissue to the fascia. Fascial incision was made and extended transversely. The fascia was separated from the  underlying rectus tissue laterally. The peritoneum was identified and entered. Peritoneal incision was extended longitudinally. The surgical assist was able to provide retraction to allow for clear visualization of surgical site. An Alexis retractor was placed in the abdomen for additional retraction. The utero-vesical peritoneal reflection was incised transversely and the bladder flap was bluntly freed from the lower uterine segment. A low transverse uterine incision was made. Delivered from breech (complete) presentation was a 2600 gram Female with Apgar scores of 8 at one minute and 9 at five minutes.  The assistant was able to apply adequate fundal pressure to allow for successful delivery of the fetus. After the umbilical cord was clamped and cut, cord blood was obtained for evaluation. Delayed cord clamping was observed. The placenta was removed intact and appeared normal.   The uterus was exteriorized and cleared of all clots and debris, however some tethering to the large right adnexal mass noted. The uterine outline, bilateral tubes and left ovary appeared normal.  The right ovary was unable to be completely visualized due to large size.  A Mirena IUD was removed from its packaging, inserted to the level of the fundus and deployed from the device applicator.  The threads were cut to approximately 12 cm and fed into the cervical canal.  The uterine incision was closed with running locked sutures of 0-Vicryl.  A second suture of 0-Vicryl was used in an imbricating layer.  Hemostasis was observed. The uterus was then returned to the abdomen.  The right ovary with mass was then brought to the incision and was able to be delivered from the abdomen intact.  The mass appeared to be fluid-filled, had the appearance of a serous cystadenoma.  As no identifiable separate ovarian  tissue could be identified, the decision was made to resect the entire mass which likely would include the ovary.  At the time of delivery of  the mass there was noted to be some torsion of the cyst around the pedicle, approximately 2 loops.  Is unclear whether this was pre-existing or whether this occurred during attempts to deliver the cystic mass.  The ovary was detorsed, and a Tanja Port was used to elevate the fallopian tube once identified.  The mesosalpinx was then clamped coagulated and transected using the LigaSure device until the fallopian tube was freed from the mass.  Next the utero-ovarian ligament, ovarian vessels, and infundibulopelvic ligament were all clamped, coagulated, and transected, using the LigaSure device until the mass was freed.  A final survey of the pelvis was performed with good hemostasis noted throughout.  The cyst was sent to pathology for evaluation. The pericolic gutters were cleared of all clots and debris. The fascia was then reapproximated with 2 separate running sutures of 0-Vicryl beginning at both apices of the vaginal incision and tied at the midline. The subcutaneous fat layer was reapproximated in 2 layers with 2-0 Vicryl. The skin was reapproximated with Insorb staples.  Dermabond was placed over the incision.  A lidocaine patch was placed on both edges of the incision.  A pressure dressing was applied.    Instrument, sponge, and needle counts were correct prior the abdominal closure and at the conclusion of the case.    An experienced assistant was required given the standard of surgical care given the complexity of the case.  This assistant was needed for exposure, dissection, suctioning, retraction, instrument exchange, and for overall help during the procedure.  GYN oncologist Dr. Leida Lauth was on standby, and was readily available for consultation if needed.   Estimated Blood Loss: 645 ml      Drains: foley catheter to gravity drainage, 100 ml of clear urine at end of the procedure         Total IV Fluids:  660 ml  Specimens: Right ovarian cyst with mass, cord blood for evaluation          Implants: None         Complications:  None; patient tolerated the procedure well.         Disposition: PACU - hemodynamically stable.         Condition: stable   Hildred Laser, MD [facility] at [facility]

## 2023-04-09 NOTE — Progress Notes (Signed)
1300- Called to pt's room for reports of dizziness/blurred vision with sudden onset. BP checked - 50's/30s. Pt passed out, was diaphoretic and difficult to arouse. RRT called. RRT and Anesthesia to bedside. Phenyl given at 1308 by anesthesia.   1313 - Bp improved and pt responsive. MD and CNM at bedside to evaluate pt. Orders for IL LR Bolus, CBC and continue to monitor VS q43min  1400- Bp decreasing (70s/40s). CNM, MD and anesthesia notified. Pt is symptomatic again with diaphoresis. Phenyl given again around this time along by anesthesia with 1L LR repeat bolus (per ob). BP improved after interventions 1418- Orders for [place] of abdomen from A. Cherry MD. Orders for stat CBC post fluid bolus per Laural Benes, MD (Anesthesia)  1545 - Bp decreased. MD (dr.cherry) notified that Bp is trending down. Md is cautious of starting third fluid bolus and will continue to monitor.   1557 - Anesthesia is notified of second low bp reading- pt is symptomatic and slower to respond. Anesthesia to bedside and 3rd phenyl dose given by Deanna.   64 - MD Dr.Cherry is present at bedside. Plans for pt to go to OR soon. Orders for stat labs and consent for exploratory laparotomy.

## 2023-04-09 NOTE — Anesthesia Preprocedure Evaluation (Signed)
Anesthesia Evaluation  Patient identified by MRN, date of birth, ID band Patient awake    Reviewed: Allergy & Precautions, NPO status , Patient's Chart, lab work & pertinent test results  History of Anesthesia Complications Negative for: history of anesthetic complications  Airway Mallampati: III  TM Distance: >3 FB Neck ROM: full    Dental  (+) Chipped, Poor Dentition   Pulmonary neg shortness of breath, Current Smoker   Pulmonary exam normal        Cardiovascular Exercise Tolerance: Good (-) hypertensionNormal cardiovascular exam     Neuro/Psych  Headaches    GI/Hepatic ,GERD  Controlled,,  Endo/Other    Renal/GU   negative genitourinary   Musculoskeletal   Abdominal   Peds  Hematology negative hematology ROS (+)   Anesthesia Other Findings Past Medical History: No date: Anemia No date: GERD (gastroesophageal reflux disease)     Comment:  OCC No date: Migraine No date: Placental abruption     Comment:  2012 at 35 wks No date: Positive pregnancy test No date: Psoriasis 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)  Past Surgical History: 2012, 2016: CESAREAN SECTION 07/09/2016: CESAREAN SECTION; N/A     Comment:  Procedure: CESAREAN SECTION;  Surgeon: Nadara Mustard,               MD;  Location: [facility] ORS;  Service: Obstetrics;                Laterality: N/A; 11/25/2019: CESAREAN SECTION; N/A     Comment:  Procedure: CESAREAN SECTION;  Surgeon: Nadara Mustard,              MD;  Location: [facility] ORS;  Service: Obstetrics;                Laterality: N/A; No date: WISDOM TOOTH EXTRACTION     Comment:  3; age14  BMI    Body Mass Index: 43.16 kg/m      Reproductive/Obstetrics (+) Pregnancy                             Anesthesia Physical Anesthesia Plan  ASA: 3  Anesthesia Plan: Spinal   Post-op Pain Management:    Induction:   PONV Risk Score and Plan:    Airway Management Planned: Natural Airway and Nasal Cannula  Additional Equipment:   Intra-op Plan:   Post-operative Plan:   Informed Consent: I have reviewed the patients History and Physical, chart, labs and discussed the procedure including the risks, benefits and alternatives for the proposed anesthesia with the patient or authorized representative who has indicated his/her understanding and acceptance.     Dental Advisory Given  Plan Discussed with: Anesthesiologist, CRNA and Surgeon  Anesthesia Plan Comments: (Patient reports no bleeding problems and no anticoagulant use.  Plan for spinal with backup GA  Patient consented for risks of anesthesia including but not limited to:  - adverse reactions to medications - damage to eyes, teeth, lips or other oral mucosa - nerve damage due to positioning  - risk of bleeding, infection and or nerve damage from spinal that could lead to paralysis - risk of headache or failed spinal - damage to teeth, lips or other oral mucosa - sore throat or hoarseness - damage to heart, brain, nerves, lungs, other parts of body or loss of life  Patient voiced understanding.)       Anesthesia Quick Evaluation

## 2023-04-09 NOTE — Progress Notes (Signed)
OB/GYN Attending Progress Note  Patient continuing to have hypotensive episodes despite fluid boluses (has received 3 liters).  Has received another phenylephrine bolus from Anesthesia with some improvement (BPs now 80s-90s/50s).  Abdominal ultrasound does note some intraabdominal fluid however no significant collections seen at this time.  Urine output low currently. Hgb has trended downward (unsure if it is now dilutional effect from multiple boluses or due to potential bleeding). Discussed with patient differential diagnosis for signs and symptoms at this time including large fluid shift due to removal of large adnexal mass along with delivery, vs post-operative intra-abdominal hemorrhage.  Will order CT scan stat and repeat labs once more. If patient continues with hypotensive episodes and or CT is more indicative of bleed, will plan to proceed urgently to the OR for exploratory laparotomy.  All of this was explained to the patient and family members present in the room. All note understanding. Surgical consent form signed to this effect. Patient typed and crossed for 2 units PRBCs.     Vitals:  Vitals:   04/09/23 1615 04/09/23 1620 04/09/23 1630 04/09/23 1637  BP: (!) 107/52  (!) 88/64 (!) 83/40  Pulse: 93  (!) 150 86  Resp:      Temp:  97.6 F (36.4 C) 97.6 F (36.4 C)   TempSrc:  Axillary Oral   SpO2:      Weight:      Height:        Labs:     Latest Ref Rng & Units 04/09/2023    3:28 PM 04/09/2023    1:37 PM 04/07/2023   11:09 AM  CBC  WBC 4.0 - 10.5 K/uL 17.4  19.1  11.8   Hemoglobin 12.0 - 15.0 g/dL 8.1  9.4  24.4   Hematocrit 36.0 - 46.0 % 24.0  27.5  34.3   Platelets 150 - 400 K/uL 299  304  336      Imaging:  [place] ASCITES (ABDOMEN LIMITED) CLINICAL DATA:  Status post cesarean section.  Hypotension  EXAM: ULTRASOUND ABDOMEN LIMITED  COMPARISON:  None Available.  FINDINGS: Scattered moderate ascites seen in the 4 quadrants of the abdomen on this limited exam. Some  of the areas appear complex in the right side of the abdomen.  IMPRESSION: Scattered ascites as well as some complex areas of fluid on the right side. With history, a component of hematoma is possible. Additional workup such as contrast CT or CTA as clinically appropriate  Electronically Signed   By: Karen Kays M.D.   On: 04/09/2023 15:08    Hildred Laser, MD [place] [facility] at [facility]

## 2023-04-09 NOTE — Progress Notes (Signed)
Called from [facility] regarding continued assistance in management of hypotension in this patient status post C-section from earlier today.  No concerns for residual anesthetic at this time.  Patient has required phenylephrine boluses earlier for the exact same thing.  Additionally, Dr. Laural Benes recommended TXA and a CTA to evaluate for continued bleeding.  She had an ultrasound showing intraabdominal fluid collection and HGB continues to decrease.  Patient has received approximately 3L of crystalloid at this point and continues to struggle with hypotension.  CRNA sent up to give more pressor to help support pressure.  It would likely be valuable to alert the ICU at this time if she is going to continue to require pressor, which I think she will.  It may also be beneficial to have 2 units crossed as I suspect she is continuing to bleed.

## 2023-04-09 NOTE — Telephone Encounter (Signed)
No other concern.  

## 2023-04-09 NOTE — Progress Notes (Signed)
Responded to rapid response called.  Patient found to be hypotensive, lethargic, and diaphoretic.  See flowsheet for VS.  Neo administered with responsive BP.

## 2023-04-09 NOTE — Significant Event (Signed)
Rapid Response Event Note   Reason for Call :  hypotension  Initial Focused Assessment:  Rapid response RN arrived in patient's room with patient lying in bed surrounded by [facility] Unit staff. Patient alert, receiving IV bolus of normal saline and oxytocin IV infusion. Per patient's nurse, patient had received a dose of medication and then reported feeling dizzy. Patient was in bed, not getting up out of bed. Recent C-section but per staff, no large amount of bleeding. Belly soft currently. Legs tingling from C section anesthesia. Initial BP upon rapid response RN's arrival 72/40, HR 80s, oxygen saturation 98%.   Interventions:  Patient already receiving bolus of IV fluids and CRNA gave dose of neosynephrine IV push. CBG 127. Repeat BP post neosynephrine administration was 95/57 MAP 67. BP just before rapid response RN left was 97/57 MAP 63.  Plan of Care:  Patient to remain in room 343 for now. Midwife Erskine Squibb still at bedside with patient and patient's nurse. No further needs from rapid response at this time, [facility] Staff to reach back out if further needs from rapid response.  Event Summary:   MD Notified: Midwife Erskine Squibb and CRNA Deana Call Time: 13:05 Arrival Time: 13:07 End Time: 13:14  Bennie Dallas, RN

## 2023-04-09 NOTE — Progress Notes (Signed)
Called to patients bedside for continued hypotension.  MAPs 70-80s systolic despite 3L of LR.  4th liter of LR started. MDA Karenz aware. Patient is more lethargic than when previously assessed.  Neo boluses administered to support pressures. Additional IV access obtained.

## 2023-04-09 NOTE — Progress Notes (Addendum)
Called to room for hypotension. Pt lightheaded. Vasopressors and IVF started with improvement in symptoms and blood pressure. Bedside ultrasound showed fluid in RUQ and LUQ but radiology will report soon. I have concern for hemorrhage. Dr. Valentino Saxon notified by midwife. I advised TXA administration, CBC after fluid bolus, and likely further imaging to evaluate for source of abdominal fluid. Pt had stable systolic blood pressures while the fluid bolus was administered in the 90's. The ultrasound was used bedside and showed a collapsed IVC and low volume hypercontractile left ventricle. No overt R or L ventricular distention noted.  Nelta Numbers MD.

## 2023-04-09 NOTE — H&P (Signed)
Obstetric Preoperative History and Physical  Carla Ingram is a 27 y.o. [id number] with IUP at [redacted]w[redacted]d presenting for presenting for scheduled repeat cesarean section with removal of large right adnexal mass (~ 20 cm), possible postpartum tubal vs IUD insertion. Fetus currently in breech presentation per last ultrasound. Prior history of C-section x 4, history of 2 prior placental abruptions. Denies complaints this morning..   Prenatal Course Source of Care: [facility] at [facility]  with onset of care at 7 weeks Pregnancy complications or risks: Patient Active Problem List   Diagnosis Date Noted   Breech presentation, no version 04/02/2023   Pregnancy with poor obstetric history 03/06/2023   History of placenta abruption 10/24/2022   Obesity affecting pregnancy 10/17/2022   Supervision of high-risk pregnancy 10/02/2022   Ovarian cyst, right 11/25/2019   History of 4 cesarean sections 07/09/2016   B12 deficiency 11/01/2015   Tobacco use disorder 11/01/2015   She plans to breastfeed She desires immediate postpartum bilateral tubal ligation vs IUD for postpartum contraception.   Prenatal labs and studies: ABO, Rh: --/--/O POS (04/15 1109) Antibody: NEG (04/15 1109) Rubella: 1.88 (10/18 1105) RPR: NON REACTIVE (04/15 1109)  HBsAg: Negative (10/18 1105)  HIV: Non Reactive (02/14 1014)  AVW:UJWJXBJY/-- (04/10 1050) 1 hr Glucola  normal Genetic screening normal Anatomy US normal   Past Medical History:  Diagnosis Date   Anemia    GERD (gastroesophageal reflux disease)    OCC   Migraine    Placental abruption    2012 at 35 wks   Positive pregnancy test    Psoriasis    TMJ (dislocation of temporomandibular joint)     Past Surgical History:  Procedure Laterality Date   CESAREAN SECTION  2012, 2016   CESAREAN SECTION N/A 07/09/2016   Procedure: CESAREAN SECTION;  Surgeon: Nadara Mustard, MD;  Location: [facility] ORS;  Service: Obstetrics;  Laterality: N/A;   CESAREAN SECTION  N/A 11/25/2019   Procedure: CESAREAN SECTION;  Surgeon: Nadara Mustard, MD;  Location: [facility] OR[facility];  Service: Obstetrics;  Laterality: N/A;   WISDOM TOOTH EXTRACTION     3; age14    OB History  Gravida Para Term Preterm AB Living  5 4 3 1  0 4  SAB IAB Ectopic Multiple Live Births  0 0 0 0 4    # Outcome Date GA Lbr Len/2nd Weight Sex Delivery Anes PTL Lv  5 Current           4 Term 11/25/19 [redacted]w[redacted]d  3190 g M CS-LTranv Spinal  LIV  3 Term 07/09/16 [redacted]w[redacted]d  3330 g F CS-LTranv Spinal  LIV  2 Term 01/19/15 [redacted]w[redacted]d  3005 g F CS-LTranv Spinal  LIV  1 Preterm 02/02/11 [redacted]w[redacted]d  2410 g F CS-LTranv Gen  LIV     Complications: Abruptio Placenta    Social History   Socioeconomic History   Marital status: Single    Spouse name: Delorse Lek   Number of children: 4   Years of education: 9   Highest education level: 9th grade  Occupational History   Occupation: unemployed   Tobacco Use   Smoking status: Every Day    Packs/day: 0.50    Years: 11.00    Additional pack years: 0.00    Total pack years: 5.50    Types: Cigarettes    Start date: 10/31/2004   Smokeless tobacco: Never  Vaping Use   Vaping Use: Former  Substance and Sexual Activity   Alcohol use: Not Currently  Alcohol/week: 8.0 standard drinks of alcohol    Types: 8 Shots of liquor per week    Comment: last use- 2021, she binge drinks when has a weekend without kids - rum   Drug use: No   Sexual activity: Yes    Partners: Male    Birth control/protection: Injection, I.U.D.  Other Topics Concern   Not on file  Social History Narrative   Not on file   Social Determinants of Health   Financial Resource Strain: Low Risk  (10/02/2022)   Overall Financial Resource Strain (CARDIA)    Difficulty of Paying Living Expenses: Not hard at all  Food Insecurity: No Food Insecurity (04/09/2023)   Hunger Vital Sign    Worried About Running Out of Food in the Last Year: Never true    Ran Out of Food in the Last Year: Never true   Transportation Needs: No Transportation Needs (04/09/2023)   PRAPARE - Administrator, Civil Service (Medical): No    Lack of Transportation (Non-Medical): No  Physical Activity: Inactive (10/02/2022)   Exercise Vital Sign    Days of Exercise per Week: 0 days    Minutes of Exercise per Session: 0 min  Stress: No Stress Concern Present (10/02/2022)   [employer] of Occupational Health - Occupational Stress Questionnaire    Feeling of Stress : Not at all  Social Connections: Moderately Isolated (10/02/2022)   Social Connection and Isolation Panel [NHANES]    Frequency of Communication with Friends and Family: More than three times a week    Frequency of Social Gatherings with Friends and Family: Twice a week    Attends Religious Services: Never    Database administrator or Organizations: No    Attends Engineer, structural: Never    Marital Status: Living with partner    Family History  Adopted: Yes  Problem Relation Age of Onset   CVA Mother    Stroke Mother        during pregnancy   ADD / ADHD Sister    ADD / ADHD Brother    ADD / ADHD Brother    ADD / ADHD Brother    ADD / ADHD Brother     Medications Prior to Admission  Medication Sig Dispense Refill Last Dose   acetaminophen (TYLENOL) 500 MG tablet Take 500 mg by mouth every 6 (six) hours as needed.      Prenatal Vit-Fe Fumarate-FA (MULTIVITAMIN-PRENATAL) 27-0.8 MG TABS tablet Take 1 tablet by mouth daily at 12 noon.       Allergies  Allergen Reactions   Macrobid [Nitrofurantoin Macrocrystal] Other (See Comments)    Can not move her body, loses all control of arms legs    Augmentin [Amoxicillin-Pot Clavulanate] Hives and Swelling   Bactrim [Sulfamethoxazole-Trimethoprim] Other (See Comments)    Pt reports body aches    Cephalexin Other (See Comments)    Achy in bones    Penicillins Hives and Swelling    Did it involve swelling of the face/tongue/throat, SOB, or low BP? No Did it involve  sudden or severe rash/hives, skin peeling, or any reaction on the inside of your mouth or nose? No Did you need to seek medical attention at a hospital or doctor's office? No When did it last happen?  childhood allergy     If all above answers are "NO", may proceed with cephalosporin use.     Review of Systems: Negative except for what is mentioned in HPI.  Physical  Exam: BP 121/69   Pulse 83   Temp 98.5 F (36.9 C) (Oral)   Resp 18   Ht 5\' 1"  (1.549 m)   Wt 103.6 kg   LMP 07/23/2022   BMI 43.16 kg/m  FHR by Doppler: 115 bpm GENERAL: Well-developed, well-nourished female in no acute distress.  LUNGS: Clear to auscultation bilaterally.  HEART: Regular rate and rhythm. ABDOMEN: Soft, nontender, nondistended, gravid, well-healed Pfannenstiel incision. PELVIC: Deferred EXTREMITIES: Nontender, no edema, 2+ distal pulses.   Pertinent Labs/Studies:   Results for orders placed or performed during the hospital encounter of 04/07/23 (from the past 72 hour(s))  CA 125     Status: None   Collection Time: 04/07/23 11:09 AM  Result Value Ref Range   Cancer Antigen (CA) 125 15.8 0.0 - 38.1 U/mL    Comment: (NOTE) Roche Diagnostics Electrochemiluminescence Immunoassay (ECLIA) Values obtained with different assay methods or kits cannot be used interchangeably.  Results cannot be interpreted as absolute evidence of the presence or absence of malignant disease. Performed At: [facility] [address] [place], [place] [id number] Jolene Schimke MD WU:[id number]   CBC     Status: Abnormal   Collection Time: 04/07/23 11:09 AM  Result Value Ref Range   WBC 11.8 (H) 4.0 - 10.5 K/uL   RBC 3.75 (L) 3.87 - 5.11 MIL/uL   Hemoglobin 11.7 (L) 12.0 - 15.0 g/dL   HCT 95.6 (L) 21.3 - 08.6 %   MCV 91.5 80.0 - 100.0 fL   MCH 31.2 26.0 - 34.0 pg   MCHC 34.1 30.0 - 36.0 g/dL   RDW 57.8 46.9 - 62.9 %   Platelets 336 150 - 400 K/uL   nRBC 0.0 0.0 - 0.2 %    Comment: Performed at [facility], [address] [place], [place] [address]  RPR     Status: None   Collection Time: 04/07/23 11:09 AM  Result Value Ref Range   RPR Ser Ql NON REACTIVE NON REACTIVE    Comment: Performed at [facility], [address] [place], [place] [address]  Type and screen     Status: None   Collection Time: 04/07/23 11:09 AM  Result Value Ref Range   ABO/RH(D) O POS    Antibody Screen NEG    Sample Expiration 04/10/2023,2359    Extend sample reason      PREGNANT WITHIN 3 MONTHS, UNABLE TO EXTEND Performed at [facility], [address] [place], [place] [address]     Imaging:  [place] MFM OB FOLLOW UP ----------------------------------------------------------------------  OBSTETRICS REPORT                       (Signed Final 03/17/2023 12:11 pm) ---------------------------------------------------------------------- Patient Info  ID #:       [id number]                          D.O.B.:  [birth year] (27 yrs)  Name:       Carla Ingram [facility]                 Visit Date: 03/17/2023 11:25 am ---------------------------------------------------------------------- Performed By  Attending:        Lin Landsman      Ref. Address:     Salomon Mast                    MD  Performed By:     Lyla Son  Stalter BS      [facility]  Referred By:      Hildred Laser                    MD ---------------------------------------------------------------------- Orders  #  Description                           Code        Ordered By  1  [place] MFM OB FOLLOW UP                   [address].60    Lin Landsman ----------------------------------------------------------------------  #  Order #                     Accession #                Episode #  1  [id number]                   [id number]                  [id number] ---------------------------------------------------------------------- Indications  Ovarian cyst complicating pregnancy (right)    O34.80  Obesity complicating pregnancy, third          O99.213  trimester (pregravid BMI 36)  Previous cesarean delivery, antepartum x 4     O34.219  Poor obstetrical history (abruption x 2)       O09.299  Encounter for other antenatal screening        Z36.2  follow-up  [redacted] weeks gestation of pregnancy                Z3A.33 ---------------------------------------------------------------------- Fetal Evaluation  Num Of Fetuses:         1  Cardiac Activity:       Observed  Presentation:           Breech  Placenta:               Posterior  P. Cord Insertion:      Previously visualized  Amniotic Fluid  AFI FV:      Within normal limits  AFI Sum(cm)     %Tile       Largest Pocket(cm)  12.1            34          5.33  RUQ(cm)                     LUQ(cm)        LLQ(cm)  5.33                        3.24  3.53 ---------------------------------------------------------------------- Biometry  BPD:     77.33  mm     G. Age:  31w 0d        < 1  %    CI:        67.18   %    70 - 86                                                          FL/HC:      20.6   %    19.4 - 21.8  HC:    302.14   mm     G. Age:  33w 4d         11  %    HC/AC:      0.97        0.96 - 1.11  AC:    310.21   mm     G. Age:  35w 0d         82  %    FL/BPD:     80.6   %    71 - 87  FL:      62.34  mm     G. Age:  32w 2d          8  %    FL/AC:      20.1   %    20 - 24  LV:        3.2  mm  Est. FW:    2253  gm    4 lb 15 oz      38  % ---------------------------------------------------------------------- OB History  Gravidity:    5         Term:   3        Prem:   1        SAB:   0  TOP:          0       Ectopic:  0        Living: 4 ---------------------------------------------------------------------- Gestational Age  LMP:           33w 6d        Date:  07/23/22                    EDD:   04/29/23  U/S Today:     33w 0d                                        EDD:   05/05/23  Best:          33w 6d     Det. By:  LMP  (07/23/22)          EDD:   04/29/23 ---------------------------------------------------------------------- Anatomy  Cranium:               Appears normal         Aortic Arch:            Previously seen  Cavum:                 Previously seen        Ductal Arch:  Previously seen  Ventricles:            Appears normal         Diaphragm:              Appears normal  Choroid Plexus:        Previously seen        Stomach:                Appears normal, left                                                                        sided  Cerebellum:            Previously seen        Abdomen:                Appears normal  Posterior Fossa:       Previously seen        Abdominal Wall:         Previously seen  Nuchal Fold:           Previously seen        Cord Vessels:           Previously seen  Face:                  Orbits and profile     Kidneys:                Appear normal                         previously seen  Lips:                  Previously seen        Bladder:                Appears normal  Thoracic:              Appears normal         Spine:                  Previously seen  Heart:                 Appears normal         Upper Extremities:      Previously seen                         (4CH, axis, and                         situs)  RVOT:                  Previously seen        Lower Extremities:      Previously seen  LVOT:                  Previously seen  Other:  VC 3VV, and 3VTV previously visualized. Heels/feet and open          hands/5th digits, Nasal bone, lenses, maxilla, mandible and falx  previously visualized. ---------------------------------------------------------------------- Cervix Uterus Adnexa  Cervix  Not visualized (advanced GA >24wks)  Uterus  No abnormality visualized.  Right Ovary  Septated cyst. 21.1 x 20.1 x 12.4  cm  Left Ovary  Not visualized.  Cul De Sac  No free fluid seen.  Adnexa  No abnormality visualized ---------------------------------------------------------------------- Impression  Follow up growth due to elevated BMI and known enlarged  ovarian cyst.  Normal interval growth with measurements consistent with  dates  Good fetal movement and amniotic fluid volume  She denies s/sx of ovarian torsion. She is coordinated for  elective ovaran cesarean delivery on 4/17  with evaluation  and removal of ovary.  This was coordinated with Dr. Valentino Saxon (see previous note by  Dr. Judeth Cornfield). ---------------------------------------------------------------------- Recommendations  Follow up as clincally indicated. ----------------------------------------------------------------------              Lin Landsman, MD Electronically Signed Final Report   03/17/2023 12:11 pm ----------------------------------------------------------------------   Assessment and Plan :Carla Ingram is a 27 y.o. Z6X0960 at [redacted]w[redacted]d being admitted for scheduled repeat cesarean section delivery with removal of right adnexal mass.  Patient has been appropriately counseled on possibility of removal of right ovary. She has also previously been counseled on need for midline abdominal  incision due to large adnexal mass. The patient is understanding of the planned procedure and is aware of and accepting of all surgical risks, including but not limited to: bleeding which may require transfusion or reoperation; infection which may require antibiotics; injury to bowel, bladder, ureters or other surrounding organs which may require repair; injury to the fetus; need for additional procedures including hysterectomy in the event of life-threatening complications; placental abnormalities wth subsequent pregnancies; incisional problems; blood clot disorders which may require blood thinners;, and other postoperative/anesthesia complications.    Patient also initially desired bilateral tubal ligation, Medicaid sterilization form previously signed.  However now patient hesitant, notes she may consider IUD insertion postpartum.  Consent form signed for both postpartum tubal ligation and for Mirena IUD insertion. Will confirm with patient in OR on final desires at time of surgery. The patient is in agreement with the proposed plan, and gives informed written consent for the procedure. All questions have been answered.   Hildred Laser, MD [facility] at [facility]

## 2023-04-10 ENCOUNTER — Inpatient Hospital Stay: Payer: Medicaid Other | Admitting: Radiology

## 2023-04-10 ENCOUNTER — Inpatient Hospital Stay: Payer: Medicaid Other | Admitting: Registered Nurse

## 2023-04-10 ENCOUNTER — Encounter: Payer: Self-pay | Admitting: Obstetrics and Gynecology

## 2023-04-10 HISTORY — PX: IR EMBO ART  VEN HEMORR LYMPH EXTRAV  INC GUIDE ROADMAPPING: IMG5450

## 2023-04-10 LAB — TYPE AND SCREEN: Antibody Screen: NEGATIVE

## 2023-04-10 LAB — CBC
HCT: 21.7 % — ABNORMAL LOW (ref 36.0–46.0)
HCT: 22.8 % — ABNORMAL LOW (ref 36.0–46.0)
Hemoglobin: 7.3 g/dL — ABNORMAL LOW (ref 12.0–15.0)
Hemoglobin: 7.7 g/dL — ABNORMAL LOW (ref 12.0–15.0)
MCH: 30.7 pg (ref 26.0–34.0)
MCH: 30.4 pg (ref 26.0–34.0)
MCHC: 33.6 g/dL (ref 30.0–36.0)
MCHC: 33.8 g/dL (ref 30.0–36.0)
MCV: 91.2 fL (ref 80.0–100.0)
MCV: 90.1 fL (ref 80.0–100.0)
Platelets: 260 10*3/uL (ref 150–400)
Platelets: 247 10*3/uL (ref 150–400)
RBC: 2.38 MIL/uL — ABNORMAL LOW (ref 3.87–5.11)
RBC: 2.53 MIL/uL — ABNORMAL LOW (ref 3.87–5.11)
RDW: 14.6 % (ref 11.5–15.5)
RDW: 15.3 % (ref 11.5–15.5)
WBC: 15.7 10*3/uL — ABNORMAL HIGH (ref 4.0–10.5)
WBC: 13.6 10*3/uL — ABNORMAL HIGH (ref 4.0–10.5)
nRBC: 0 % (ref 0.0–0.2)
nRBC: 0 % (ref 0.0–0.2)

## 2023-04-10 LAB — SURGICAL PATHOLOGY

## 2023-04-10 MED ORDER — DOCUSATE SODIUM 100 MG PO CAPS: 100.0000 mg | ORAL_CAPSULE | Freq: Two times a day (BID) | ORAL | 2 refills | Status: DC | PRN

## 2023-04-10 MED ORDER — LIDOCAINE HCL 1 % IJ SOLN
8.0000 mL | Freq: Once | INTRAMUSCULAR | Status: AC
  Administered 2023-04-10: 8 mL via INTRADERMAL

## 2023-04-10 MED ORDER — ACETAMINOPHEN 500 MG PO TABS
1000.0000 mg | ORAL_TABLET | Freq: Four times a day (QID) | ORAL | Status: DC | PRN
  Administered 2023-04-10 – 2023-04-12 (×3): 1000 mg via ORAL
  Filled 2023-04-10 (×3): qty 2

## 2023-04-10 MED ORDER — CLINDAMYCIN PHOSPHATE 900 MG/50ML IV SOLN: 900.0000 mg | INTRAVENOUS | Status: DC

## 2023-04-10 MED ORDER — SIMETHICONE 80 MG PO CHEW: 80.0000 mg | CHEWABLE_TABLET | Freq: Three times a day (TID) | ORAL | 1 refills | Status: DC

## 2023-04-10 MED ORDER — LACTATED RINGERS IV SOLN: INTRAVENOUS | Status: DC

## 2023-04-10 MED ORDER — GELATIN ABSORBABLE 12-7 MM EX MISC
1.0000 | Freq: Once | CUTANEOUS | Status: AC
  Administered 2023-04-10: 1

## 2023-04-10 MED ORDER — FERROUS SULFATE 325 (65 FE) MG PO TABS: 325.0000 mg | ORAL_TABLET | Freq: Two times a day (BID) | ORAL | 3 refills | Status: AC

## 2023-04-10 MED ORDER — FENTANYL CITRATE (PF) 100 MCG/2ML IJ SOLN
INTRAMUSCULAR | Status: AC
  Filled 2023-04-10: qty 2

## 2023-04-10 MED ORDER — ACETAMINOPHEN 325 MG PO TABS: 650.0000 mg | ORAL_TABLET | Freq: Four times a day (QID) | ORAL | 1 refills | Status: DC

## 2023-04-10 MED ORDER — MIDAZOLAM HCL 2 MG/2ML IJ SOLN
INTRAMUSCULAR | Status: AC | PRN
  Administered 2023-04-10: 1 mg via INTRAVENOUS

## 2023-04-10 MED ORDER — GABAPENTIN 300 MG PO CAPS: 300.0000 mg | ORAL_CAPSULE | ORAL | Status: DC

## 2023-04-10 MED ORDER — IOHEXOL 300 MG/ML  SOLN
155.0000 mL | Freq: Once | INTRAMUSCULAR | Status: AC | PRN
  Administered 2023-04-10: 155 mL via INTRA_ARTERIAL

## 2023-04-10 MED ORDER — SODIUM CHLORIDE 0.9% IV SOLUTION: Freq: Once | INTRAVENOUS | Status: AC

## 2023-04-10 MED ORDER — GENTAMICIN SULFATE 40 MG/ML IJ SOLN: 5.0000 mg/kg | INTRAVENOUS | Status: DC

## 2023-04-10 MED ORDER — LIDOCAINE HCL 1 % IJ SOLN
INTRAMUSCULAR | Status: AC
  Filled 2023-04-10: qty 20

## 2023-04-10 MED ORDER — GELATIN ABSORBABLE 12-7 MM EX MISC: 1.0000 | Freq: Once | CUTANEOUS | Status: DC

## 2023-04-10 MED ORDER — POVIDONE-IODINE 10 % EX SWAB: 2.0000 | Freq: Once | CUTANEOUS | Status: DC

## 2023-04-10 MED ORDER — MIDAZOLAM HCL 2 MG/2ML IJ SOLN
INTRAMUSCULAR | Status: AC
  Filled 2023-04-10: qty 4

## 2023-04-10 MED ORDER — FENTANYL CITRATE (PF) 100 MCG/2ML IJ SOLN
INTRAMUSCULAR | Status: AC | PRN
  Administered 2023-04-10: 25 ug via INTRAVENOUS

## 2023-04-10 MED ORDER — ACETAMINOPHEN 500 MG PO TABS: 1000.0000 mg | ORAL_TABLET | ORAL | Status: DC

## 2023-04-10 MED ORDER — CELECOXIB 200 MG PO CAPS: 400.0000 mg | ORAL_CAPSULE | ORAL | Status: DC

## 2023-04-10 NOTE — Sedation Documentation (Signed)
Blood transfusion ended. VSS. Pt. Without any c/o verbalized at present. Will monitor for poss. S/SX of reaction during case.

## 2023-04-10 NOTE — Consult Note (Signed)
Chief Complaint: Patient was seen in consultation today for pelvic angiogram with possible intervention, possible embolization.   Referring Physician(s): Cherry,Anika  Supervising Physician: Pernell Dupre  Patient Status: [facility] - In-pt  History of Present Illness: Carla Ingram is a 27 y.o. female with a medical history significant for c-section x 4 and 2 prior placental abruptions. She presented to the hospital yesterday 04/09/23 for a scheduled repeat c-section (#5, 37 wk 1 day gestation) with removal of a large right adnexal mass (presumed serous cystadenoma), possible postpartum tubal versus IUD insertion.   Post-procedure, the patient developed dizziness and hypotension requiring a dose of neosynephrine. She stabilized but continued to have hypotensive episodes despite fluid boluses. Her hemoglobin level also began to trend down. CT imaging showed a moderate amount of intraperitoneal bleeding.   CT abdomen/pelvis with contrast 04/09/23 IMPRESSION: 1. Moderate amount of intraperitoneal bleed in the lower abdomen/pelvis, and right greater left. There is faint amount of small contrast blush arising from a small arterial branch in the right hemipelvis. 2. The arms of the IUD do not appear in appropriate positioning. Attention on follow-up imaging recommended.  Patient received one unit of PRBCs but repeat hemoglobin still dropped. Interventional Radiology has been asked by Dr. Valentino Saxon to evaluate this patient for an image-guided pelvic angiogram with possible embolization. Imaging reviewed and procedure approved by Dr. Juliette Alcide.   Past Medical History:  Diagnosis Date   Anemia    GERD (gastroesophageal reflux disease)    OCC   Migraine    Placental abruption    2012 at 35 wks   Positive pregnancy test    Psoriasis    TMJ (dislocation of temporomandibular joint)     Past Surgical History:  Procedure Laterality Date   CESAREAN SECTION  2012, 2016   CESAREAN SECTION N/A  07/09/2016   Procedure: CESAREAN SECTION;  Surgeon: Nadara Mustard, MD;  Location: [facility] ORS;  Service: Obstetrics;  Laterality: N/A;   CESAREAN SECTION N/A 11/25/2019   Procedure: CESAREAN SECTION;  Surgeon: Nadara Mustard, MD;  Location: [facility] ORS;  Service: Obstetrics;  Laterality: N/A;   WISDOM TOOTH EXTRACTION     3; age14    Allergies: Macrobid [nitrofurantoin macrocrystal], Augmentin [amoxicillin-pot clavulanate], Bactrim [sulfamethoxazole-trimethoprim], Cephalexin, and Penicillins  Medications: Prior to Admission medications   Medication Sig Start Date End Date Taking? Authorizing Provider  acetaminophen (TYLENOL) 500 MG tablet Take 500 mg by mouth every 6 (six) hours as needed.    [provider]  Prenatal Vit-Fe Fumarate-FA (MULTIVITAMIN-PRENATAL) 27-0.8 MG TABS tablet Take 1 tablet by mouth daily at 12 noon.    [provider]     Family History  Adopted: Yes  Problem Relation Age of Onset   CVA Mother    Stroke Mother        during pregnancy   ADD / ADHD Sister    ADD / ADHD Brother    ADD / ADHD Brother    ADD / ADHD Brother    ADD / ADHD Brother     Social History   Socioeconomic History   Marital status: Single    Spouse name: Carla Ingram   Number of children: 4   Years of education: 9   Highest education level: 9th grade  Occupational History   Occupation: unemployed   Tobacco Use   Smoking status: Every Day    Packs/day: 0.50    Years: 11.00    Additional pack years: 0.00    Total pack years:  5.50    Types: Cigarettes    Start date: 10/31/2004   Smokeless tobacco: Never  Vaping Use   Vaping Use: Former  Substance and Sexual Activity   Alcohol use: Not Currently    Alcohol/week: 8.0 standard drinks of alcohol    Types: 8 Shots of liquor per week    Comment: last use- 2021, she binge drinks when has a weekend without kids - rum   Drug use: No   Sexual activity: Yes    Partners: Male    Birth control/protection: Injection,  I.U.D.  Other Topics Concern   Not on file  Social History Narrative   Not on file   Social Determinants of Health   Financial Resource Strain: Low Risk  (10/02/2022)   Overall Financial Resource Strain (CARDIA)    Difficulty of Paying Living Expenses: Not hard at all  Food Insecurity: No Food Insecurity (04/09/2023)   Hunger Vital Sign    Worried About Running Out of Food in the Last Year: Never true    Ran Out of Food in the Last Year: Never true  Transportation Needs: No Transportation Needs (04/09/2023)   PRAPARE - Administrator, Civil Service (Medical): No    Lack of Transportation (Non-Medical): No  Physical Activity: Inactive (10/02/2022)   Exercise Vital Sign    Days of Exercise per Week: 0 days    Minutes of Exercise per Session: 0 min  Stress: No Stress Concern Present (10/02/2022)   [employer] of Occupational Health - Occupational Stress Questionnaire    Feeling of Stress : Not at all  Social Connections: Moderately Isolated (10/02/2022)   Social Connection and Isolation Panel [NHANES]    Frequency of Communication with Friends and Family: More than three times a week    Frequency of Social Gatherings with Friends and Family: Twice a week    Attends Religious Services: Never    Database administrator or Organizations: No    Attends Banker Meetings: Never    Marital Status: Living with partner    Review of Systems: A 12 point ROS discussed and pertinent positives are indicated in the HPI above.  All other systems are negative.  Review of Systems  Constitutional:  Positive for fatigue. Negative for appetite change.  Respiratory:  Negative for cough and shortness of breath.   Cardiovascular:  Negative for chest pain and leg swelling.  Gastrointestinal:  Positive for abdominal pain. Negative for diarrhea, nausea and vomiting.       C-section  Musculoskeletal:  Negative for back pain.  Neurological:  Negative for dizziness and  headaches.    Vital Signs: BP (!) 108/48   Pulse 83   Temp 98.3 F (36.8 C) (Oral)   Resp 16   Ht 5\' 1"  (1.549 m)   Wt 228 lb 6.3 oz (103.6 kg)   LMP 07/23/2022   SpO2 98%   Breastfeeding Unknown   BMI 43.16 kg/m   Physical Exam Constitutional:      General: She is not in acute distress.    Appearance: She is not ill-appearing.  HENT:     Mouth/Throat:     Mouth: Mucous membranes are moist.     Pharynx: Oropharynx is clear.  Cardiovascular:     Rate and Rhythm: Normal rate and regular rhythm.     Pulses: Normal pulses.  Pulmonary:     Effort: Pulmonary effort is normal.  Abdominal:     Palpations: Abdomen is soft.  Tenderness: There is abdominal tenderness.     Comments: Post-op day #1 from c-section.  Skin:    General: Skin is warm and dry.  Neurological:     Mental Status: She is alert and oriented to person, place, and time.  Psychiatric:        Mood and Affect: Mood normal.        Behavior: Behavior normal.        Thought Content: Thought content normal.        Judgment: Judgment normal.     Imaging: CT ABDOMEN PELVIS W CONTRAST  Result Date: 04/09/2023 CLINICAL DATA:  Hypotension and abdominal distension. Status post C-section. Evaluate for intra-abdominal bleed. EXAM: CT ABDOMEN AND PELVIS WITH CONTRAST TECHNIQUE: Multidetector CT imaging of the abdomen and pelvis was performed using the standard protocol following bolus administration of intravenous contrast. RADIATION DOSE REDUCTION: This exam was performed according to the departmental dose-optimization program which includes automated exposure control, adjustment of the mA and/or kV according to patient size and/or use of iterative reconstruction technique. CONTRAST:  OMNIPAQUE IOHEXOL 300 MG/ML  SOLN COMPARISON:  None Available. FINDINGS: Lower chest: The visualized lung bases are clear. Small scattered pneumoperitoneum, small ascites, and moderate intraperitoneal bleed in the lower abdomen and  pelvis. Hepatobiliary: No focal liver abnormality is seen. No gallstones, gallbladder wall thickening, or biliary dilatation. Pancreas: Unremarkable. No pancreatic ductal dilatation or surrounding inflammatory changes. Spleen: Normal in size without focal abnormality. Adrenals/Urinary Tract: The adrenal glands, and kidneys are unremarkable. The urinary bladder is decompressed around a Foley catheter. Stomach/Bowel: There is no bowel obstruction. The appendix is normal. Vascular/Lymphatic: The abdominal aorta and IVC are unremarkable. No portal venous gas. There is no adenopathy. Reproductive: The uterus is mildly enlarged in keeping with postpartum state. Anterior uterine C-section incision noted. An intrauterine device is noted in the upper endometrium. The arms of the IUD do not appear in appropriate positioning. Attention on follow-up imaging recommended. Small pockets of air along the C-section incision in the lower uterus. Other: There is moderate amount of intraperitoneal bleed in the lower abdomen/pelvis, and right greater left. There is faint amount of small contrast blush arising from a small arterial branch in the right hemipelvis (66-69/2). Musculoskeletal: Midline vertical anterior pelvic wall surgical incision as well as additional horizontal C-section scar. There is induration of the periumbilical subcutaneous fat. No acute osseous pathology. IMPRESSION: 1. Moderate amount of intraperitoneal bleed in the lower abdomen/pelvis, and right greater left. There is faint amount of small contrast blush arising from a small arterial branch in the right hemipelvis. 2. The arms of the IUD do not appear in appropriate positioning. Attention on follow-up imaging recommended. These results were called by telephone at the time of interpretation on 04/09/2023 at 7:29 pm to provider [facility] , who verbally acknowledged these results. Electronically Signed   By: Elgie Collard M.D.   On: 04/09/2023 19:36   [place]  ASCITES (ABDOMEN LIMITED)  Result Date: 04/09/2023 CLINICAL DATA:  Status post cesarean section.  Hypotension EXAM: ULTRASOUND ABDOMEN LIMITED COMPARISON:  None Available. FINDINGS: Scattered moderate ascites seen in the 4 quadrants of the abdomen on this limited exam. Some of the areas appear complex in the right side of the abdomen. IMPRESSION: Scattered ascites as well as some complex areas of fluid on the right side. With history, a component of hematoma is possible. Additional workup such as contrast CT or CTA as clinically appropriate Electronically Signed   By: Piedad Climes.D.  On: 04/09/2023 15:08   [place] MFM OB FOLLOW UP  Result Date: 03/17/2023 ----------------------------------------------------------------------  OBSTETRICS REPORT                       (Signed Final 03/17/2023 12:11 pm) ---------------------------------------------------------------------- Patient Info  ID #:       [id number]                          D.O.B.:  23-Nov-1996 (27 yrs)  Name:       Carla Ingram [facility]                 Visit Date: 03/17/2023 11:25 am ---------------------------------------------------------------------- Performed By  Attending:        Lin Landsman      Ref. Address:     Salomon Mast                    MD  Performed By:     Eden Lathe BS      [facility]  Referred By:      Hildred Laser                    MD ---------------------------------------------------------------------- Orders  #  Description                           Code        Ordered By  1  [place] MFM OB FOLLOW UP                   40981.19    Lin Landsman ----------------------------------------------------------------------  #  Order #                     Accession #                Episode #  1  [id number]                   [id number]                 [id number]  ---------------------------------------------------------------------- Indications  Ovarian cyst complicating pregnancy (right)    O34.80  Obesity complicating pregnancy, third          O99.213  trimester (pregravid BMI 36)  Previous cesarean delivery, antepartum x 4     O34.219  Poor obstetrical history (abruption x 2)       O09.299  Encounter for other antenatal screening        Z36.2  follow-up  [redacted] weeks gestation of pregnancy                Z3A.33 ---------------------------------------------------------------------- Fetal Evaluation  Num Of Fetuses:  1  Cardiac Activity:       Observed  Presentation:           Breech  Placenta:               Posterior  P. Cord Insertion:      Previously visualized  Amniotic Fluid  AFI FV:      Within normal limits  AFI Sum(cm)     %Tile       Largest Pocket(cm)  12.1            34          5.33  RUQ(cm)                     LUQ(cm)        LLQ(cm)  5.33                        3.24           3.53 ---------------------------------------------------------------------- Biometry  BPD:     77.33  mm     G. Age:  31w 0d        < 1  %    CI:        67.18   %    70 - 86                                                          FL/HC:      20.6   %    19.4 - 21.8  HC:    302.14   mm     G. Age:  33w 4d         11  %    HC/AC:      0.97        0.96 - 1.11  AC:    310.21   mm     G. Age:  35w 0d         82  %    FL/BPD:     80.6   %    71 - 87  FL:      62.34  mm     G. Age:  32w 2d          8  %    FL/AC:      20.1   %    20 - 24  LV:        3.2  mm  Est. FW:    2253  gm    4 lb 15 oz      38  % ---------------------------------------------------------------------- OB History  Gravidity:    5         Term:   3        Prem:   1        SAB:   0  TOP:          0       Ectopic:  0        Living: 4 ---------------------------------------------------------------------- Gestational Age  LMP:           33w 6d        Date:  07/23/22                   EDD:  04/29/23  U/S Today:     33w 0d                                         EDD:   05/05/23  Best:          33w 6d     Det. By:  LMP  (07/23/22)          EDD:   04/29/23 ---------------------------------------------------------------------- Anatomy  Cranium:               Appears normal         Aortic Arch:            Previously seen  Cavum:                 Previously seen        Ductal Arch:            Previously seen  Ventricles:            Appears normal         Diaphragm:              Appears normal  Choroid Plexus:        Previously seen        Stomach:                Appears normal, left                                                                        sided  Cerebellum:            Previously seen        Abdomen:                Appears normal  Posterior Fossa:       Previously seen        Abdominal Wall:         Previously seen  Nuchal Fold:           Previously seen        Cord Vessels:           Previously seen  Face:                  Orbits and profile     Kidneys:                Appear normal                         previously seen  Lips:                  Previously seen        Bladder:                Appears normal  Thoracic:              Appears normal         Spine:                  Previously seen  Heart:  Appears normal         Upper Extremities:      Previously seen                         (4CH, axis, and                         situs)  RVOT:                  Previously seen        Lower Extremities:      Previously seen  LVOT:                  Previously seen  Other:  VC 3VV, and 3VTV previously visualized. Heels/feet and open          hands/5th digits, Nasal bone, lenses, maxilla, mandible and falx          previously visualized. ---------------------------------------------------------------------- Cervix Uterus Adnexa  Cervix  Not visualized (advanced GA >24wks)  Uterus  No abnormality visualized.  Right Ovary  Septated cyst. 21.1 x 20.1 x 12.4 cm  Left Ovary  Not visualized.  Cul De Sac  No free fluid seen.  Adnexa  No  abnormality visualized ---------------------------------------------------------------------- Impression  Follow up growth due to elevated BMI and known enlarged  ovarian cyst.  Normal interval growth with measurements consistent with  dates  Good fetal movement and amniotic fluid volume  She denies s/sx of ovarian torsion. She is coordinated for  elective ovaran cesarean delivery on 4/17  with evaluation  and removal of ovary.  This was coordinated with Dr. Valentino Saxon (see previous note by  Dr. Judeth Cornfield). ---------------------------------------------------------------------- Recommendations  Follow up as clincally indicated. ----------------------------------------------------------------------              Lin Landsman, MD Electronically Signed Final Report   03/17/2023 12:11 pm ----------------------------------------------------------------------   Labs:  CBC: Recent Labs    04/09/23 1337 04/09/23 1528 04/09/23 1725 04/10/23 0416  WBC 19.1* 17.4* 17.9* 15.7*  HGB 9.4* 8.1* 7.9* 7.3*  HCT 27.5* 24.0* 23.3* 21.7*  PLT 304 299 304 260    COAGS: No results for input(s): "INR", "APTT" in the last 8760 hours.  BMP: Recent Labs    10/15/22 1347 04/09/23 1725  NA 134* 135  K 3.1* 4.3  CL 108 110  CO2 18* 20*  GLUCOSE 125* 131*  BUN 6 8  CALCIUM 8.5* 7.7*  CREATININE 0.43* 0.65  GFRNONAA >60 >60    LIVER FUNCTION TESTS: Recent Labs    10/15/22 1347 04/09/23 1725  BILITOT 0.5 0.4  AST 18 23  ALT 14 13  ALKPHOS 73 78  PROT 6.9 4.2*  ALBUMIN 3.5 1.9*    TUMOR MARKERS: No results for input(s): "AFPTM", "CEA", "CA199", "CHROMGRNA" in the last 8760 hours.  Assessment and Plan:  Post-op Day #1 from c-section with removal of right ovarian cyst complicated by intra-abdominal bleed: Dr. Juliette Alcide met with the patient at the bedside this morning to discuss the current clinical picture. Patient awake, alert in bed with no signs of discomfort or distress. She is bottle-feeding her  baby and her sister is at the bedside. Dr. Juliette Alcide stated that the patient was currently stable and he discussed the risks/benefits of conservative management versus proceeding with a pelvic angiogram and possible embolization. After further discussion the patient expressed a desire to pursue pelvic angiogram with possible embolization. She has been NPO.  Risks and benefits of this procedure were discussed with the patient including, but not limited to bleeding, infection, vascular injury or contrast induced renal failure.  This interventional procedure involves the use of X-rays and because of the nature of the planned procedure, it is possible that we will have prolonged use of X-ray fluoroscopy.  Potential radiation risks to you include (but are not limited to) the following: - A slightly elevated risk for cancer  several years later in life. This risk is typically less than 0.5% percent. This risk is low in comparison to the normal incidence of human cancer, which is 33% for women and 50% for men according to the American [employer] Society. - Radiation induced injury can include skin redness, resembling a rash, tissue breakdown / ulcers and hair loss (which can be temporary or permanent).   The likelihood of either of these occurring depends on the difficulty of the procedure and whether you are sensitive to radiation due to previous procedures, disease, or genetic conditions.   IF your procedure requires a prolonged use of radiation, you will be notified and given written instructions for further action.  It is your responsibility to monitor the irradiated area for the 2 weeks following the procedure and to notify your physician if you are concerned that you have suffered a radiation induced injury.    All of the patient's questions were answered, patient is agreeable to proceed.  Consent signed and in IR.   Thank you for this interesting consult.  I greatly enjoyed meeting SENIYAH ESKER and  look forward to participating in their care.  A copy of this report was sent to the requesting provider on this date.  Electronically Signed: Alwyn Ren, AGACNP-BC [phone] 04/10/2023, 10:32 AM   I spent a total of 20 Minutes    in face to face in clinical consultation, greater than 50% of which was counseling/coordinating care for pelvic angiogram with possible intervention, possible embolization.

## 2023-04-10 NOTE — Discharge Summary (Signed)
Postpartum Discharge Summary      Patient Name: Carla Ingram DOB: [birth year] MRN: [id number]  Date of admission: 04/09/2023 Delivery date:04/09/2023  Delivering provider: Hildred Laser  Date of discharge: 04/12/2023  Admitting diagnosis: S/P cesarean section [Z98.891] Intrauterine pregnancy: [redacted]w[redacted]d     Secondary diagnosis:  Principal Problem:   S/P cesarean section Active Problems:   History of 4 cesarean sections   Ovarian cyst, right   Supervision of high-risk pregnancy   Obesity affecting pregnancy   History of placenta abruption   Breech presentation, no version   Postoperative hemorrhagic shock  Additional problems: 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 yo [id number] at [redacted]w[redacted]d was admitted to the hospital 04/09/2023 for scheduled cesarean section with removal of right adnexal mass for history of prior C-section x 4 and large adnexal mass ( (~ 22 cm)).Delivery details are as follows:  Membrane Rupture Time/Date: 8:32 AM ,04/09/2023   Delivery Method:C-Section, Low Transverse  Details of operation can be found in separate operative note.  Patient had a postpartum course complicated by delayed postpartum intra-abdominal hemorrhage and hemorrhagic shock on POD#0. She was treated with 3 L of IVF through boluses, and received 2 boluses of pressors (phenylephrine).  CT scan noted concerns for moderate hematoma and slow bleed. She underwent pelvic angiogram and embolization. She also received 2 units PRBCs during admission.  On POD #2 she is ambulating, tolerating a regular diet, passing flatus, and urinating well. On POD#3 pt doing well, ambulating and voiding without difficulty,.  Has had a BM.  Wearing abdominal binder, it is causing some back discomfort. Pain  has been well controlled without narcotic. Pt requesting script for home, she remembers after her last c-section having more pain at home d/t needing to climb the stairs. Mood has been ok, tearful today as her infant will need to remain in SCN for a few days. Has pumped a few times, plans to continue at home.  Patient is discharged home in stable condition on  04/12/23        Newborn Data: Birth date:04/09/2023  Birth time:8:33 AM  Gender:Female  Living status:Living  Apgars:8 ,9  Weight:2600 g     Magnesium Sulfate received: No BMZ received: No Rhophylac:No MMR:No T-DaP:Given prenatally Flu: No Transfusion:Yes (2 units PRBCs)  Physical exam  Vitals:   04/11/23 0820 04/11/23 1716 04/11/23 2308 04/12/23 0824  BP: 127/76 132/75 136/79 121/77  Pulse: 94 98 92 86  Resp: 20 20 18 18   Temp: 98.3 F (36.8 C) 98 F (36.7 C) 99.1 F (37.3 C) 98.3 F (36.8 C)  TempSrc: Oral Oral Oral Oral  SpO2: 97% 99% 97% 98%  Weight:      Height:       General: alert, cooperative, and no distress Lochia: appropriate Uterine Fundus: firm Incision: Healing well with no significant drainage, No significant erythema, Dressing is clean, dry, and intact DVT Evaluation: No evidence of DVT seen on physical exam. Negative Homan's sign. No cords or calf tenderness. No significant calf/ankle edema. Labs: Lab Results  Component Value Date   WBC 8.9 04/12/2023   HGB 7.1 (L) 04/12/2023   HCT 21.3 (L) 04/12/2023   MCV 91.8 04/12/2023   PLT 285 04/12/2023      Latest Ref Rng & Units 04/09/2023    5:25 PM  CMP  Glucose 70 - 99 mg/dL 161   BUN 6 - 20 mg/dL 8   Creatinine 0.96 - 0.45 mg/dL 4.09   Sodium 811 - 914 mmol/L 135   Potassium 3.5 - 5.1 mmol/L 4.3   Chloride 98 - 111 mmol/L 110   CO2 22 - 32 mmol/L 20   Calcium 8.9 - 10.3 mg/dL 7.7   Total Protein 6.5 - 8.1 g/dL 4.2   Total Bilirubin 0.3 - 1.2 mg/dL 0.4    Alkaline Phos 38 - 126 U/L 78   AST 15 - 41 U/L 23   ALT 0 - 44 U/L 13    Edinburgh Score:    04/11/2023   12:00 PM  Edinburgh Postnatal Depression Scale Screening Tool  I have been able to laugh and see the funny side of things. 0  I have looked forward with enjoyment to things. 0  I have blamed myself unnecessarily when things went wrong. 0  I have been anxious or worried for no good reason. 0  I have felt scared or panicky for no good reason. 0  Things have been getting on top of me. 0  I have been so unhappy that I have had difficulty sleeping. 0  I have felt sad or miserable. 0  I have been so unhappy that I have been crying. 0  The thought of harming myself has occurred to me. 0  Edinburgh Postnatal Depression Scale Total 0      After visit meds:  Allergies as of 04/12/2023       Reactions   Macrobid [nitrofurantoin Macrocrystal] Other (See Comments)   Can not move her body, loses all control of arms legs    Augmentin [amoxicillin-pot Clavulanate] Hives, Swelling   Bactrim [sulfamethoxazole-trimethoprim] Other (See Comments)   Pt reports body aches    Cephalexin Other (See Comments)   Achy in bones   Penicillins Hives, Swelling   Did it involve swelling of the face/tongue/throat, SOB, or low BP? No Did it involve sudden or severe rash/hives, skin peeling, or any reaction on the inside of your mouth or nose? No Did you need to seek medical attention at a hospital or doctor's office? No When did it last happen?  childhood allergy     If all above answers are "NO", may proceed with cephalosporin use.        Medication List     TAKE these medications    acetaminophen 325 MG tablet Commonly known as: TYLENOL Take 2 tablets (650 mg total) by mouth every 6 (six) hours. What changed:  medication strength how much to take when to take this reasons to take this   docusate sodium 100 MG capsule Commonly known as: COLACE Take 1 capsule (100 mg total) by mouth 2  (two) times daily as needed.   ferrous sulfate 325 (65 FE) MG tablet Take 1 tablet (325 mg total) by mouth 2 (two) times daily with a meal.   multivitamin-prenatal 27-0.8 MG Tabs tablet Take 1 tablet by mouth daily at 12 noon.   simethicone 80 MG chewable tablet Commonly known as: MYLICON Chew 1 tablet (80 mg total) by mouth 3 (three) times daily after meals.   traMADol 50 MG tablet  Commonly known as: ULTRAM Take 1-2 tablets (50-100 mg total) by mouth every 6 (six) hours as needed for moderate pain.               Discharge Care Instructions  (From admission, onward)           Start     Ordered   04/12/23 0000  Discharge wound care:       Comments: SHOWER DAILY Wash incision gently with soap and water.  Call office with any drainage, redness, or firmness of the incision.   04/12/23 1014             Discharge home in stable condition Infant Feeding: Breast Infant Disposition:NICU Discharge instruction: per After Visit Summary and Postpartum booklet. Activity: Advance as tolerated. Pelvic rest for 6 weeks.  Diet: routine diet Anticipated Birth Control: PP IUD placed Postpartum Appointment:6 weeks Additional Postpartum F/U: Incision check 1 week Future Appointments: Future Appointments  Date Time Provider Department Center  04/14/2023 11:30 AM Hildred Laser, MD [facility] None   Follow up Visit:  Follow-up Information     [facility]. Go in 3 day(s).   Specialty: Obstetrics and Gynecology Why: Post-op appointment scheduled for 04/14/23 - Monday at 11:30am Contact information: [address] [place] [address] [phone]                    04/12/2023 Ellouise Newer [facility], CNM

## 2023-04-10 NOTE — Procedures (Signed)
Interventional Radiology Procedure Note  Date of Procedure: 04/10/2023  Procedure: Pelvic angiogram and embolization   Findings:  1. Pelvic angiogram without definite active bleed identified. Successful empiric embolization of an anterior division branch supplying the right adenxal region using Gelfoam  2. Right CFA access with AngioSeal closure   Complications: No immediate complications noted.   Estimated Blood Loss: minimal  Follow-up and Recommendations: 1. Bedrest 4 hours  2. Follow up Hgb    Olive Bass, MD  Vascular & Interventional Radiology  04/10/2023 12:18 PM

## 2023-04-10 NOTE — Anesthesia Post-op Follow-up Note (Signed)
  Anesthesia Pain Follow-up Note  Patient: Carla Ingram  Day #: 1  Date of Follow-up: 04/10/2023 Time: 7:29 AM  Last Vitals:  Vitals:   04/10/23 0210 04/10/23 0418  BP: 99/62 (!) 108/58  Pulse: 81 84  Resp: 18 18  Temp: 36.8 C 36.8 C  SpO2: 100% 96%    Level of Consciousness: alert  Pain: mild   Side Effects:None  Catheter Site Exam:clean     Plan: D/C from anesthesia care at surgeon's request  Foye Deer

## 2023-04-10 NOTE — Anesthesia Postprocedure Evaluation (Signed)
Anesthesia Post Note  Patient: Carla Ingram  Procedure(s) Performed: REPEAT CESAREAN SECTION OOPHORECTOMY (Right)  Patient location during evaluation: Mother Baby Anesthesia Type: Spinal Level of consciousness: oriented and awake and alert Pain management: pain level controlled Vital Signs Assessment: post-procedure vital signs reviewed and stable Respiratory status: spontaneous breathing and respiratory function stable Cardiovascular status: blood pressure returned to baseline and stable Postop Assessment: no headache, no backache, no apparent nausea or vomiting, patient able to bend at knees and adequate PO intake Anesthetic complications: no Comments: Pt has not ambulated and has a foley catheter due to monitoring of I/O's. She had an intraperitoneal bleed yesterday. The hypotension was treated with crystalloid and 1 unit of prbc with monitoring of CBC. She was last hypotensive at 4 pm per chart review. She denies having recent symptoms of hypotension. The spinal anesthetic was uncomplicated but the patient's bleeding status is managed by OB.      No notable events documented.   Last Vitals:  Vitals:   04/10/23 0210 04/10/23 0418  BP: 99/62 (!) 108/58  Pulse: 81 84  Resp: 18 18  Temp: 36.8 C 36.8 C  SpO2: 100% 96%    Last Pain:  Vitals:   04/10/23 0418  TempSrc: Oral  PainSc:                  Foye Deer

## 2023-04-10 NOTE — Sedation Documentation (Signed)
Angioseal deployed by MD. Pt. Tolerated well.

## 2023-04-10 NOTE — Sedation Documentation (Signed)
No transfusion reaction noted. Pt. Tolerating procedure well.

## 2023-04-10 NOTE — Progress Notes (Signed)
Called to assess bleeding from incision site. Per RN, Sherre Poot got up for the first time, and her dressing was saturated with blood upon her return to bed. Pressure dressing removed. Dressings saturated with BRB. Oozing noted from incision but no active heavy bleeding. Pressure dressing placed. Dr. Valentino Saxon notified.   Glenetta Borg, CNM 11:11 PM 04/10/23

## 2023-04-10 NOTE — Progress Notes (Signed)
Postpartum Day # 1: Cesarean Delivery (repeat) with removal of large right adnexal cyst, IUD insertion.   Significant Events:  Patient with several hypotensive episodes yesterday. Received 3 L IVF boluses and pressors x 2, [place] followed by CT scan noting moderate fluid collection in pelvis, concerning for intra-abdominal bleed. Patient stabilized, with plateau in Hgb after serial H/H's.  Monitored overnight and received 1 unit PRBCs. Urine output initially decreased but by yesterday evening returned to normal.     Subjective: Patient reports no complaints. States she overall feels fine. Is hungry as she did not get a chance to eat much yesterday.  Is ready to ambulate. Denies significant abdominal pain, SOB, chest pain, fevers, chills, nausea, vomiting. Is breastfeeding. Denies heavy vaginal bleeding.      Objective: Vital signs in last 24 hours: Temp:  [97.5 F (36.4 C)-98.5 F (36.9 C)] 98.3 F (36.8 C) (04/18 0823) Pulse Rate:  [56-150] 83 (04/18 0823) Resp:  [15-20] 16 (04/18 0823) BP: (53-143)/(28-118) 108/48 (04/18 0823) SpO2:  [96 %-100 %] 98 % (04/18 0823)   I/O last 3 completed shifts: In: 5314 [I.V.:1522.3; Blood:310; IV Piggyback:3481.8] Out: 2250 [Urine:1505; Blood:745] No intake/output data recorded.   Physical Exam:  General: alert and no distress Lungs: clear to auscultation bilaterally Breasts: normal appearance, no masses or tenderness Heart: regular rate and rhythm, S1, S2 normal, no murmur, click, rub or gallop Abdomen: Soft, appropriately tender to palpation at incision site. Mild tenderness in LLQ. Bowel sounds present.  Pelvis: Lochia appropriate, Uterine Fundus firm, Incision: bandage clean/dry/intact Extremities: DVT Evaluation: No evidence of DVT seen on physical exam. Negative Homan's sign. No cords or calf tenderness. No significant calf/ankle edema.   Labs:   Latest Reference Range & Units 04/07/23 11:09 04/09/23 13:37 04/09/23 15:28 04/09/23 17:25  04/10/23 04:16  WBC 4.0 - 10.5 K/uL 11.8 (H) 19.1 (H) 17.4 (H) 17.9 (H) 15.7 (H)  RBC 3.87 - 5.11 MIL/uL 3.75 (L) 2.96 (L) 2.57 (L) 2.48 (L) 2.38 (L)  Hemoglobin 12.0 - 15.0 g/dL 16.1 (L) 9.4 (L) 8.1 (L) 7.9 (L) 7.3 (L)  HCT 36.0 - 46.0 % 34.3 (L) 27.5 (L) 24.0 (L) 23.3 (L) 21.7 (L)  MCV 80.0 - 100.0 fL 91.5 92.9 93.4 94.0 91.2  MCH 26.0 - 34.0 pg 31.2 31.8 31.5 31.9 30.7  MCHC 30.0 - 36.0 g/dL 09.6 04.5 40.9 81.1 91.4  RDW 11.5 - 15.5 % 13.8 13.9 14.1 14.1 14.6  Platelets 150 - 400 K/uL 336 304 299 304 260  nRBC 0.0 - 0.2 % 0.0 0.0 0.0 0.0 0.0  (H): Data is abnormally high (L): Data is abnormally low   CMP     Latest Ref Rng & Units 10/15/2022    1:47 PM  CMP  Glucose 70 - 99 mg/dL 782   BUN 6 - 20 mg/dL 6   Creatinine 9.56 - 2.13 mg/dL 0.86   Sodium 578 - 469 mmol/L 134   Potassium 3.5 - 5.1 mmol/L 3.1   Chloride 98 - 111 mmol/L 108   CO2 22 - 32 mmol/L 18   Calcium 8.9 - 10.3 mg/dL 8.5   Total Protein 6.5 - 8.1 g/dL 6.9   Total Bilirubin 0.3 - 1.2 mg/dL 0.5   Alkaline Phos 38 - 126 U/L 73   AST 15 - 41 U/L 18   ALT 0 - 44 U/L 14     Assessment/Plan: - Status post Cesarean section. Postoperative course complicated by intra-abdominal bleed, hypotension.    - Patient with suboptimal  response to blood transfusion overnight. Concern now for slower bleed still present.  Will consult IR for possible embolization. - Breastfeeding, Lactation consult,  - Contraception IUD placed intraoperatively - NPO for now in case embolization procedure occurs.  - Continue PO pain management - Remove foley catheter once procedure completed.  - For additional unit of blood today for anemia.  - Continue current care.   Hildred Laser, MD [facility] at [facility]

## 2023-04-11 LAB — TYPE AND SCREEN
ABO/RH(D): O POS
Extend sample reason: UNDETERMINED

## 2023-04-11 LAB — BPAM RBC
Blood Product Expiration Date: 202405252359
Blood Product Expiration Date: 202405252359
Unit Type and Rh: 5100

## 2023-04-11 LAB — CBC
HCT: 21.7 % — ABNORMAL LOW (ref 36.0–46.0)
Hemoglobin: 7.2 g/dL — ABNORMAL LOW (ref 12.0–15.0)
MCH: 29.9 pg (ref 26.0–34.0)
MCHC: 33.2 g/dL (ref 30.0–36.0)
MCV: 90 fL (ref 80.0–100.0)
Platelets: 254 10*3/uL (ref 150–400)
RBC: 2.41 MIL/uL — ABNORMAL LOW (ref 3.87–5.11)
RDW: 15.6 % — ABNORMAL HIGH (ref 11.5–15.5)
WBC: 12.3 10*3/uL — ABNORMAL HIGH (ref 4.0–10.5)
nRBC: 0 % (ref 0.0–0.2)

## 2023-04-11 NOTE — Progress Notes (Signed)
Referring Physician(s): Hildred Laser, MD  Supervising Physician: Richarda Overlie  Patient Status:  [facility] - In-pt  Chief Complaint:  S/p Gel-foam embolization of anterior pudendal artery 04/10/23  Subjective:  Patient sitting upright in chair at time of exam. She reports feeling well and eager to return home with her newborn infant. She denies any pain or discomfort at the right CFA puncture site.   Allergies: Macrobid [nitrofurantoin macrocrystal], Augmentin [amoxicillin-pot clavulanate], Bactrim [sulfamethoxazole-trimethoprim], Cephalexin, and Penicillins  Medications: Prior to Admission medications   Medication Sig Start Date End Date Taking? Authorizing Provider  docusate sodium (COLACE) 100 MG capsule Take 1 capsule (100 mg total) by mouth 2 (two) times daily as needed. 04/10/23  Yes Hildred Laser, MD  acetaminophen (TYLENOL) 325 MG tablet Take 2 tablets (650 mg total) by mouth every 6 (six) hours. 04/10/23   Hildred Laser, MD  acetaminophen (TYLENOL) 500 MG tablet Take 500 mg by mouth every 6 (six) hours as needed.    [provider]  ferrous sulfate 325 (65 FE) MG tablet Take 1 tablet (325 mg total) by mouth 2 (two) times daily with a meal. 04/10/23   Hildred Laser, MD  Prenatal Vit-Fe Fumarate-FA (MULTIVITAMIN-PRENATAL) 27-0.8 MG TABS tablet Take 1 tablet by mouth daily at 12 noon.    [provider]  simethicone (MYLICON) 80 MG chewable tablet Chew 1 tablet (80 mg total) by mouth 3 (three) times daily after meals. 04/11/23   Hildred Laser, MD     Vital Signs: BP 127/76 (BP Location: Right Arm)   Pulse 94   Temp 98.3 F (36.8 C) (Oral)   Resp 20   Ht 5\' 1"  (1.549 m)   Wt 228 lb 6.3 oz (103.6 kg)   LMP 07/23/2022   SpO2 97%   Breastfeeding Unknown   BMI 43.16 kg/m   Physical Exam Vitals reviewed.  Constitutional:      General: She is not in acute distress.    Appearance: She is not ill-appearing.  Cardiovascular:     Rate and Rhythm: Normal rate  and regular rhythm.     Pulses: Normal pulses.     Heart sounds: Normal heart sounds.  Pulmonary:     Effort: Pulmonary effort is normal.     Breath sounds: Normal breath sounds.  Abdominal:     Palpations: Abdomen is soft.     Comments: Mild tenderness in anterior abdomen around caesarian section site. Dressing over this site has small amount of dried blood visible  Musculoskeletal:     Right lower leg: No edema.     Left lower leg: No edema.  Skin:    General: Skin is warm and dry.     Comments: Right CFA puncture site soft, non-tender with no concern for pseudoaneurysm at this time.  Neurological:     Mental Status: She is alert and oriented to person, place, and time.  Psychiatric:        Mood and Affect: Mood normal.        Behavior: Behavior normal.        Thought Content: Thought content normal.        Judgment: Judgment normal.     Imaging: IR EMBO ART  VEN HEMORR LYMPH EXTRAV  INC GUIDE ROADMAPPING  Result Date: 04/10/2023 INDICATION: Postop day 1 status post C-section and right oophorectomy, concern for pelvic bleeding EXAM: 1. Ultrasound-guided puncture of the right common femoral artery 2. Catheterization and angiography of the right internal iliac artery 3. Selective catheterization  and angiography of the anterior division of the right internal iliac artery 4. Selective catheterization and angiography of the anterior pudendal artery 5. Embolization of a branch of the anterior pudendal artery using Gel-Foam MEDICATIONS: Documented in the EMR ANESTHESIA/SEDATION: Moderate (conscious) sedation was employed during this procedure. A total of Versed 0.5 mg and Fentanyl 25 mcg was administered intravenously by the radiology nurse. Total intra-service moderate Sedation Time: 60 minutes. The patient's level of consciousness and vital signs were monitored continuously by radiology nursing throughout the procedure under my direct supervision. CONTRAST:  155 mL Omnipaque 300 FLUOROSCOPY:  Radiation Exposure Index (as provided by the fluoroscopic device): 11 minutes (3,174 mGy) COMPLICATIONS: None immediate. PROCEDURE: Informed consent was obtained from the patient following explanation of the procedure, risks, benefits and alternatives. The patient understands, agrees and consents for the procedure. All questions were addressed. A time out was performed prior to the initiation of the procedure. Maximal barrier sterile technique utilized including caps, mask, sterile gowns, sterile gloves, large sterile drape, hand hygiene, and Betadine prep. The patient was placed supine on the exam table. The right groin was prepped and draped in the standard sterile fashion. Ultrasound was used to evaluate the right common femoral artery, which found to be patent and suitable for access. An ultrasound image was permanently stored in the electronic medical record. Using ultrasound guidance, the right common femoral artery was directly punctured using a 21 gauge micropuncture set. An 018 wire was advanced centrally, followed by serial tract dilation and placement of a 5 French sheath. Using combination of a rim catheter and Bentson guidewire, the ipsilateral right internal iliac artery was catheterized. Angiography of the right internal iliac artery was performed. This demonstrated expected branching anatomy of the posterior and anterior divisions of the internal iliac artery. Expected branching of the anterior division including the inferior rectal artery, anterior pudendal artery and uterine artery were identified. No definite bleeding was observed. No other findings of pseudoaneurysm or arterial cutoff for identified. Using combination of a 2.8 [place] Progreat Omega microcatheter and fathom 14 microwire, the anterior division of the internal iliac artery was catheterized. Again angiography of the anterior division was performed, demonstrating expected branching anatomy including the inferior rectal, anterior  pudendal, and uterine arteries. Given location of bleeding on CT, the decision was made to proceed with additional catheterization and interrogation of the anterior pudendal artery. The inferior rectal artery and uterine arteries were not suspected to be the culprit in this situation. Microcatheter and wire were then advanced into the anterior pudendal artery. Additional angiography of the anterior tibial artery was performed, demonstrating normal-appearing terminal branches. However, given suspected bleed from this location, the decision was made to proceed with embolization using a temporary hemostatic agent. Gel-Foam slurry was then infused into the anterior pudendal artery. Post embolization angiography demonstrated appropriate stasis within the embolized artery. Microcatheter was then withdrawn into the anterior division of the internal iliac artery, and post embolization angiography performed. No further findings of bleeding identified. At the end of the procedure, all catheters and wires were withdrawn. Hemostasis was achieved at the access site using the Angio-Seal vascular closure device. Distal pulses were unchanged. A sterile dressing was placed. The patient tolerated the procedure well without immediate complication. IMPRESSION: 1. Successful pelvic angiogram without definite source of bleeding identified. 2. Successful empiric embolization of the anterior pudendal artery using Gel-Foam (temporary hemostatic agent). Electronically Signed   By: Olive Bass M.D.   On: 04/10/2023 14:14   CT ABDOMEN PELVIS  W CONTRAST  Result Date: 04/09/2023 CLINICAL DATA:  Hypotension and abdominal distension. Status post C-section. Evaluate for intra-abdominal bleed. EXAM: CT ABDOMEN AND PELVIS WITH CONTRAST TECHNIQUE: Multidetector CT imaging of the abdomen and pelvis was performed using the standard protocol following bolus administration of intravenous contrast. RADIATION DOSE REDUCTION: This exam was performed  according to the departmental dose-optimization program which includes automated exposure control, adjustment of the mA and/or kV according to patient size and/or use of iterative reconstruction technique. CONTRAST:  OMNIPAQUE IOHEXOL 300 MG/ML  SOLN COMPARISON:  None Available. FINDINGS: Lower chest: The visualized lung bases are clear. Small scattered pneumoperitoneum, small ascites, and moderate intraperitoneal bleed in the lower abdomen and pelvis. Hepatobiliary: No focal liver abnormality is seen. No gallstones, gallbladder wall thickening, or biliary dilatation. Pancreas: Unremarkable. No pancreatic ductal dilatation or surrounding inflammatory changes. Spleen: Normal in size without focal abnormality. Adrenals/Urinary Tract: The adrenal glands, and kidneys are unremarkable. The urinary bladder is decompressed around a Foley catheter. Stomach/Bowel: There is no bowel obstruction. The appendix is normal. Vascular/Lymphatic: The abdominal aorta and IVC are unremarkable. No portal venous gas. There is no adenopathy. Reproductive: The uterus is mildly enlarged in keeping with postpartum state. Anterior uterine C-section incision noted. An intrauterine device is noted in the upper endometrium. The arms of the IUD do not appear in appropriate positioning. Attention on follow-up imaging recommended. Small pockets of air along the C-section incision in the lower uterus. Other: There is moderate amount of intraperitoneal bleed in the lower abdomen/pelvis, and right greater left. There is faint amount of small contrast blush arising from a small arterial branch in the right hemipelvis (66-69/2). Musculoskeletal: Midline vertical anterior pelvic wall surgical incision as well as additional horizontal C-section scar. There is induration of the periumbilical subcutaneous fat. No acute osseous pathology. IMPRESSION: 1. Moderate amount of intraperitoneal bleed in the lower abdomen/pelvis, and right greater left. There  is faint amount of small contrast blush arising from a small arterial branch in the right hemipelvis. 2. The arms of the IUD do not appear in appropriate positioning. Attention on follow-up imaging recommended. These results were called by telephone at the time of interpretation on 04/09/2023 at 7:29 pm to provider [facility] , who verbally acknowledged these results. Electronically Signed   By: Elgie Collard M.D.   On: 04/09/2023 19:36   [place] ASCITES (ABDOMEN LIMITED)  Result Date: 04/09/2023 CLINICAL DATA:  Status post cesarean section.  Hypotension EXAM: ULTRASOUND ABDOMEN LIMITED COMPARISON:  None Available. FINDINGS: Scattered moderate ascites seen in the 4 quadrants of the abdomen on this limited exam. Some of the areas appear complex in the right side of the abdomen. IMPRESSION: Scattered ascites as well as some complex areas of fluid on the right side. With history, a component of hematoma is possible. Additional workup such as contrast CT or CTA as clinically appropriate Electronically Signed   By: Karen Kays M.D.   On: 04/09/2023 15:08    Labs:  CBC: Recent Labs    04/09/23 1725 04/10/23 0416 04/10/23 1512 04/11/23 0523  WBC 17.9* 15.7* 13.6* 12.3*  HGB 7.9* 7.3* 7.7* 7.2*  HCT 23.3* 21.7* 22.8* 21.7*  PLT 304 260 247 254    COAGS: No results for input(s): "INR", "APTT" in the last 8760 hours.  BMP: Recent Labs    10/15/22 1347 04/09/23 1725  NA 134* 135  K 3.1* 4.3  CL 108 110  CO2 18* 20*  GLUCOSE 125* 131*  BUN 6 8  CALCIUM 8.5* 7.7*  CREATININE 0.43* 0.65  GFRNONAA >60 >60    LIVER FUNCTION TESTS: Recent Labs    10/15/22 1347 04/09/23 1725  BILITOT 0.5 0.4  AST 18 23  ALT 14 13  ALKPHOS 73 78  PROT 6.9 4.2*  ALBUMIN 3.5 1.9*    Assessment and Plan:  Patient reports feeling better following gel-foam embolization of anterior pudendal artery secondary to post-Caesarian section bleeding. The patient's Hgb is 7.2 today (7.7 yesterday). Per note from  Dr Valentino Saxon, limited drainage from Caesarian section site is expected. Patient otherwise feeling well. No concern for pseudoaneurysm at right CFA site. No further intervention from IR needed at this time, please contact IR team with any questions or concerns.  Electronically Signed: Kennieth Francois, PA-C 04/11/2023, 4:28 PM   I spent a total of 15 Minutes at the the patient's bedside AND on the patient's hospital floor or unit, greater than 50% of which was counseling/coordinating care for s/p gel-foam embolization of anterior pudendal artery.

## 2023-04-11 NOTE — Lactation Note (Signed)
This note was copied from a baby's chart. Lactation Consultation Note  Patient Name: Boy Latresha Yahr [id number] Date: 04/11/2023 Age:29 hours Reason for consult: Follow-up assessment   Maternal Data Does the patient have breastfeeding experience prior to this delivery?: Yes How long did the patient breastfeed?: 3-4 mths (milk dried up)  Feeding Mother's Current Feeding Choice: Formula Spoke with mom regarding her feeding preference, she states she is going to formula feed baby, states baby didn't latch, I informed her that if she would like help latching we would be able to help her as she wishes   LATCH Score                    Lactation Tools Discussed/Used    Interventions    Discharge [facility] Program: Yes  Consult Status Consult Status: Complete    Dyann Kief 04/11/2023, 6:40 PM

## 2023-04-11 NOTE — Discharge Instructions (Signed)
Discharge instructions:  Appointment scheduled for 11:30 on Monday with Dr Valentino Saxon.  Call office if you have any of the following:  headache, visual changes, fever >101.0 F, chills, breast concerns (engorgement, mastitis) excessive vaginal bleeding, incision drainage or problems, leg pain or redness, depression or any other concerns.   Activity: Do not lift > 10 lbs for 6 weeks.  No intercourse or tampons for 6 weeks.  No driving for 1-2 weeks or while taking pain medication. No strenuous activity or heavy lifting for 6 weeks.  No swimming pools, hot tubs or tub baths- showers only.    It is normal to bleed for up to 6 weeks. You should not soak through more than 1 pad in 1 hour.   Continue prenatal vitamin. Increase calories and fluids while breastfeeding.  Your milk will come in, in the next couple of days (right now it is colostrum).  You may have a slight fever when your milk comes in, but it should go away on its own.   If it does not, and rises above 101 F please call the doctor.  You will also feel achy and your breasts will be firm. They will also start to leak.  If you are breastfeeding, continue as you have been and you can pump/express milk for comfort.   For concerns about your baby, please call your pediatrician For breastfeeding concerns, the lactation consultant can be reached at [phone]  Postpartum blues (feelings of happy one minute and sad another minute) are normal for the first few weeks but if it gets worse let your doctor know.

## 2023-04-11 NOTE — Progress Notes (Signed)
Postpartum Day # 2: Cesarean Delivery (repeat) with removal of large right adnexal cyst, IUD insertion. Hypovolemic shock due to delayed postpartum intra-abdominal hemorrhage, POD#1 s/p pelvic angiogram and embolism. Has received blood transfusion 2 units PRBCs this admission.      Subjective: Patient reports no complaints. Has been ambulating without difficulty, voiding and tolerating PO.  Passing flatus.  Notes she had moderate bleeding from her incision this morning after bending over to get something out of her bag. Denies nausea, vomiting, chest pain, SOB, dizziness. Is breastfeeding. Denies heavy vaginal bleeding.      Objective: Vital signs in last 24 hours: Temp:  [97.6 F (36.4 C)-98.5 F (36.9 C)] 98.3 F (36.8 C) (04/19 0323) Pulse Rate:  [76-101] 97 (04/19 0323) Resp:  [16-26] 18 (04/19 0323) BP: (105-139)/(48-82) 139/82 (04/19 0323) SpO2:  [87 %-99 %] 98 % (04/19 0323)   I/O last 3 completed shifts: In: 1358.4 [I.V.:776.3; Blood:582.2] Out: 2275 [Urine:2275] No intake/output data recorded.   Physical Exam:  General: alert and no distress Lungs: clear to auscultation bilaterally Breasts: normal appearance, no masses or tenderness Heart: regular rate and rhythm, S1, S2 normal, no murmur, click, rub or gallop Abdomen: Soft, appropriately tender to palpation at incision site. Mild tenderness in LLQ. Bowel sounds present.  Pelvis: Lochia appropriate, Uterine Fundus firm, Incision: bandage soaked,, removed, incision cleaned, healing well with no evidence of separation or dehiscence, pinpoint opening at base of the incision, small amount of drainage expressed on palpation.  Extremities: DVT Evaluation: No evidence of DVT seen on physical exam. Negative Homan's sign. No cords or calf tenderness. No significant calf/ankle edema.   Labs:     Latest Ref Rng & Units 04/11/2023    5:23 AM 04/10/2023    3:12 PM 04/10/2023    4:16 AM  CBC  WBC 4.0 - 10.5 K/uL 12.3  13.6  15.7    Hemoglobin 12.0 - 15.0 g/dL 7.2  7.7  7.3   Hematocrit 36.0 - 46.0 % 21.7  22.8  21.7   Platelets 150 - 400 K/uL 254  247  260      CMP     Latest Ref Rng & Units 04/09/2023    5:25 PM  CMP  Glucose 70 - 99 mg/dL 161   BUN 6 - 20 mg/dL 8   Creatinine 0.96 - 0.45 mg/dL 4.09   Sodium 811 - 914 mmol/L 135   Potassium 3.5 - 5.1 mmol/L 4.3   Chloride 98 - 111 mmol/L 110   CO2 22 - 32 mmol/L 20   Calcium 8.9 - 10.3 mg/dL 7.7   Total Protein 6.5 - 8.1 g/dL 4.2   Total Bilirubin 0.3 - 1.2 mg/dL 0.4   Alkaline Phos 38 - 126 U/L 78   AST 15 - 41 U/L 23   ALT 0 - 44 U/L 13     Assessment/Plan: - Status post Cesarean section. Postoperative course complicated by intra-abdominal bleed, hypovolemic shock. S/p pelvic angiogram and embolism, blood transfusion 2 units PRBCs.    - Patient overall continues to remain stable.  - Breastfeeding, Lactation consult, as needed - Contraception IUD placed intraoperatively. Will f/u at postpartum visit.  - Regular diet.  - Dressing changed and reinforced today. Most likely drainage of large hematoma occurring through incision site. Incision overall healing well and intact.  Informed patient that this may occur several more times over the next several days as she becomes more active. Patient strongly desires to go home today. Will instruct on  how to redress incision if moderate bleeding occurs today. Encouraged use of abdominal binder when ambulatory.   - Continue PO pain management. - Continue PO iron supplementation for anemia BID.  -Plan for d/c home later today.    Hildred Laser, MD [facility] at [facility]

## 2023-04-11 NOTE — Progress Notes (Signed)
1340 - MD notified of pt's second dressing change. No new orders, oozing from op site is expected at this stage per md. Pt will continue to change dressing as needed. May proceed with discharge.   1550 - D/c home orders canceled. Pt will stay overnight with infant.

## 2023-04-12 LAB — CBC
HCT: 21.3 % — ABNORMAL LOW (ref 36.0–46.0)
Hemoglobin: 7.1 g/dL — ABNORMAL LOW (ref 12.0–15.0)
MCH: 30.6 pg (ref 26.0–34.0)
MCHC: 33.3 g/dL (ref 30.0–36.0)
MCV: 91.8 fL (ref 80.0–100.0)
Platelets: 285 10*3/uL (ref 150–400)
RBC: 2.32 MIL/uL — ABNORMAL LOW (ref 3.87–5.11)
RDW: 15.7 % — ABNORMAL HIGH (ref 11.5–15.5)
WBC: 8.9 10*3/uL (ref 4.0–10.5)
nRBC: 0 % (ref 0.0–0.2)

## 2023-04-12 MED ORDER — TRAMADOL HCL 50 MG PO TABS: 50.0000 mg | ORAL_TABLET | Freq: Four times a day (QID) | ORAL | 0 refills | Status: DC | PRN

## 2023-04-12 NOTE — Progress Notes (Signed)
Obstetric Postpartum Daily Progress Note Subjective:  27 y.o. F6O1308 postpartum day #3 status post RLTCS plus removal of large adnexal cycst.  She is ambulating, is tolerating po, is voiding spontaneously.  Her pain is well controlled on PO pain medications. Her lochia is less than menses. Is tearful today but doing ok. Her infant will need to remain in the SCN for a few days. Has pumped a few times but has not seen anything yet. Has an electric pump at home. Desires to be discharged as she would like to go home to see her other children.    Medications SCHEDULED MEDICATIONS   ferrous sulfate  325 mg Oral Q breakfast   gabapentin  200 mg Oral TID   prenatal multivitamin  1 tablet Oral Q1200   senna-docusate  2 tablet Oral Daily   simethicone  80 mg Oral TID PC    MEDICATION INFUSIONS   naloxone HCl (NARCAN) 2 mg in dextrose 5 % 250 mL infusion      PRN MEDICATIONS  acetaminophen, coconut oil, witch hazel-glycerin **AND** dibucaine, diphenhydrAMINE **OR** diphenhydrAMINE, HYDROmorphone (DILAUDID) injection, menthol-cetylpyridinium, naloxone **AND** sodium chloride flush, naloxone HCl (NARCAN) 2 mg in dextrose 5 % 250 mL infusion, ondansetron (ZOFRAN) IV, simethicone, traMADol, zolpidem    Objective:   Vitals:   04/11/23 0820 04/11/23 1716 04/11/23 2308 04/12/23 0824  BP: 127/76 132/75 136/79 121/77  Pulse: 94 98 92 86  Resp: Temp: 98.3 F (36.8 C) 98 F (36.7 C) 99.1 F (37.3 C) 98.3 F (36.8 C)  TempSrc: Oral Oral Oral Oral  SpO2: 97% 99% 97% 98%  Weight:      Height:        Current Vital Signs 24h Vital Sign Ranges  T 98.3 F (36.8 C) Temp  Avg: 98.5 F (36.9 C)  Min: 98 F (36.7 C)  Max: 99.1 F (37.3 C)  BP 121/77 BP  Min: 121/77  Max: 136/79  HR 86 Pulse  Avg: 92  Min: 86  Max: 98  RR 18 Resp  Avg: 18.7  Min: 18  Max: 20  SaO2 98 % (Room Air) Room Air SpO2  Avg: 98 %  Min: 97 %  Max: 99 %       24 Hour I/O Current Shift I/O  Time Ins Outs 04/19 0701  - 04/20 0700 In: 0  Out: 400 [Urine:400] No intake/output data recorded.  General: NAD Pulmonary: no increased work of breathing Abdomen: non-distended, wearing abdominal binder, pressure dressing dry and intact.  Extremities: trace edema, no erythema, no tenderness  Labs:  Recent Labs  Lab 04/10/23 1512 04/11/23 0523 04/12/23 0548  WBC 13.6* 12.3* 8.9  HGB 7.7* 7.2* 7.1*  HCT 22.8* 21.7* 21.3*  PLT 247 254 285     Assessment:   27 y.o. G5P4105 postpartum day # 3 status post RLTCS plus removal of large adnexal cyst.   Plan:   1) Acute blood loss anemia - hemodynamically stable and asymptomatic - po ferrous sulfate  2) O POS / Rubella 1.88 (10/18 1105)/ Varicella Immune  3) TDAP status 02/05/2023  4) breast and bottle /Contraception = IUD placed intraoperatively   5) Disposition discharge today   Ellouise Newer [facility], CNM  04/12/2023 10:03 AM

## 2023-04-12 NOTE — Progress Notes (Signed)
Discharge instructions reviewed and printed copies given to patient for resource after discharge home.  Appointment for Monday at 11:30 with Dr Valentino Saxon.  Dressing to be changed by provider at office visit.  Honeycomb dressing given to patient to take to office with her on Monday.  Questions answered.  Patient  taken to medical mall entrance via wheelchair.  Baby remains in SCN at this time.

## 2023-04-14 ENCOUNTER — Telehealth: Payer: Self-pay | Admitting: Obstetrics and Gynecology

## 2023-04-14 ENCOUNTER — Ambulatory Visit (INDEPENDENT_AMBULATORY_CARE_PROVIDER_SITE_OTHER): Payer: Medicaid Other | Admitting: Obstetrics and Gynecology

## 2023-04-14 ENCOUNTER — Encounter: Payer: Self-pay | Admitting: Obstetrics and Gynecology

## 2023-04-14 VITALS — BP 126/74 | HR 99 | Resp 16 | Ht 61.0 in | Wt 215.0 lb

## 2023-04-14 DIAGNOSIS — D649 Anemia, unspecified: Secondary | ICD-10-CM | POA: Diagnosis not present

## 2023-04-14 DIAGNOSIS — D62 Acute posthemorrhagic anemia: Secondary | ICD-10-CM

## 2023-04-14 DIAGNOSIS — O900 Disruption of cesarean delivery wound: Secondary | ICD-10-CM | POA: Diagnosis not present

## 2023-04-14 DIAGNOSIS — Z4889 Encounter for other specified surgical aftercare: Secondary | ICD-10-CM

## 2023-04-14 DIAGNOSIS — T8131XA Disruption of external operation (surgical) wound, not elsewhere classified, initial encounter: Secondary | ICD-10-CM

## 2023-04-14 DIAGNOSIS — Z98891 History of uterine scar from previous surgery: Secondary | ICD-10-CM

## 2023-04-14 NOTE — Progress Notes (Signed)
    OBSTETRICS/GYNECOLOGY POST-OPERATIVE CLINIC VISIT  Subjective:     Carla Ingram is a 27 y.o. female who presents to the clinic 1 weeks status post Repeat Cesarean Section Oophorectomy for repeat cesarean with excision of right ovarian mass and oophorectomy,  pelvic angiogram and embolization following intra-abdominal bleed post-operatively. Currently with moderate anemia. . Eating a regular diet without difficulty. Bowel movements are normal. Pain is controlled with current analgesics. Medications being used: acetaminophen. Patient reports flatus.  States that she has only had to change her dressing site once over the weekend. Denies any symptoms of anemia.   Postpartum Day # 5: Cesarean Delivery  Objective:   BP 126/74   Pulse 99   Resp 16   Ht  (1.549 m)   Wt 215 lb (97.5 kg)   BMI 40.62 kg/m  Body mass index is 40.62 kg/m.  General:  alert and no distress  Abdomen: soft, bowel sounds active, non-tender  Incision:   Superficial wound separation at the top portion of the incision (~ 2 cm in length), and a small area of superficial separation at lower 1/3 of the incision (~ 1 cm in length).  Absorbable staples visible at both sites. Small amount of serosanguinous drainage present from site.   No erythema, no hernia, no seroma, no swelling, no deep dehiscence.  Area of skin at LLQ with superficial desquamation of 2 x 3 cm area (likely secondary to adhesives).   Pathology:   SURGICAL PATHOLOGY  CASE: [id number]  PATIENT: Carla Ingram  Surgical Pathology Report   Specimen Submitted:  A. Ovarian cyst, right   Clinical History: History of previous cesarean section   DIAGNOSIS:  A. OVARY, RIGHT; CYSTECTOMY:  - BENIGN MUCINOUS CYSTADENOMA.  - BACKGROUND BENIGN OVARIAN PARENCHYMA WITH PHYSIOLOGIC CHANGES.  - NEGATIVE FOR MALIGNANCY.   GROSS DESCRIPTION:  A. Labeled: Right ovary with cyst  Received: Fresh  Collection time: 8:49 AM on 04/09/2023  Placed into  formalin time: 9:46 AM on 04/09/2023  Type of procedure: Repeat cesarean section  Integrity: Intact  Weight of specimen: 2250 grams  Size of specimen:            Ovary: 20 x 15 x 13.5 cm  Ovarian surface: Pink to purple and smooth.  The external surface is  inked entirely black.   Description: The ovary is comprised of a multiloculated cystic structure  with a smooth inner lining  Cyst contents: Serous and mucinous fluid   Block summary:  1 -7 - representative sections (1/cm)    Recent Labs    04/12/23 0548  HGB 7.1*  HCT 21.3*    Assessment/Plan:  Patient s/p Repeat Cesarean Section Oophorectomy (surgery), pelvic angiogram and embolization following intra-abdominal bleed post-operatively. Moderate anemia.  Post-op course complicated by superficial wound separation and drainage.   1. Continue any current medications as instructed by provider. 2. Wound care discussed. Dressing removed, Steri-strips applied to areas of wound separation for better approximation of skin edges. New bandage placed.  3. Operative findings again reviewed. Pathology report discussed. 4. Activity restrictions: no bending, stooping, or squatting, pelvic rest. 5. Anticipated return to work: not applicable. 6. Follow up: Friday for reassessment of wound.    Hildred Laser, MD [facility]

## 2023-04-14 NOTE — Patient Instructions (Signed)

## 2023-04-16 ENCOUNTER — Encounter: Payer: Medicaid Other | Admitting: Obstetrics and Gynecology

## 2023-04-16 ENCOUNTER — Other Ambulatory Visit: Payer: Medicaid Other

## 2023-04-18 ENCOUNTER — Encounter: Payer: Self-pay | Admitting: Obstetrics and Gynecology

## 2023-04-18 ENCOUNTER — Ambulatory Visit (INDEPENDENT_AMBULATORY_CARE_PROVIDER_SITE_OTHER): Payer: Medicaid Other | Admitting: Obstetrics and Gynecology

## 2023-04-18 VITALS — BP 116/65 | HR 101 | Resp 16 | Ht 61.0 in | Wt 208.5 lb

## 2023-04-18 DIAGNOSIS — Z4889 Encounter for other specified surgical aftercare: Secondary | ICD-10-CM

## 2023-04-18 DIAGNOSIS — T8131XA Disruption of external operation (surgical) wound, not elsewhere classified, initial encounter: Secondary | ICD-10-CM

## 2023-04-18 DIAGNOSIS — Z98891 History of uterine scar from previous surgery: Secondary | ICD-10-CM

## 2023-04-18 NOTE — Patient Instructions (Signed)

## 2023-04-18 NOTE — Progress Notes (Signed)
     OBSTETRICS/GYNECOLOGY POST-OPERATIVE CLINIC VISIT  Subjective:     Carla Ingram is a 27 y.o. female who presents to the clinic 1 weeks status post Repeat Cesarean Section Oophorectomy for repeat cesarean with excision of right ovarian mass and oophorectomy,  pelvic angiogram and embolization following intra-abdominal bleed post-operatively. Is following up for wound check.  Was seen earlier this week for dressing change.  Patient notes that she is doing much better. Only had to change dressing once two days ago.  Notes she is able to move around much more and her abdomen is not as bruised and tender.    Objective:   BP 126/74   Pulse 99   Resp 16   Ht 5\' 1"  (1.549 m)   Wt 215 lb (97.5 kg)   BMI 40.62 kg/m  Body mass index is 40.62 kg/m.  General:  alert and no distress  Abdomen: soft, bowel sounds active, non-tender  Incision:   Steri strips in place over upper incision, appears to be healing well. Dressing light to moderately stained with brownish discharge. No obvious drainage today.  No erythema, no hernia, no seroma, no swelling, no deep dehiscence.       Assessment/Plan:  Patient s/p Repeat Cesarean Section Oophorectomy (surgery), pelvic angiogram and embolization following intra-abdominal bleed post-operatively. Moderate anemia.  Post-op course complicated by superficial wound separation and drainage.   1. Wound care discussed. Dressing removed, new honeycomb dressing applied.  New bandage placed.  2. Activity restrictions: no bending, stooping, or squatting, pelvic rest. 3. Follow up: 1 week for reassessment of wound, and postpartum depression screen.    Hildred Laser, MD [facility]

## 2023-04-23 ENCOUNTER — Encounter: Payer: Self-pay | Admitting: Obstetrics and Gynecology

## 2023-04-23 ENCOUNTER — Ambulatory Visit (INDEPENDENT_AMBULATORY_CARE_PROVIDER_SITE_OTHER): Payer: Medicaid Other | Admitting: Obstetrics and Gynecology

## 2023-04-23 VITALS — BP 123/63 | HR 86 | Resp 16 | Ht 61.0 in | Wt 210.0 lb

## 2023-04-23 DIAGNOSIS — Z98891 History of uterine scar from previous surgery: Secondary | ICD-10-CM

## 2023-04-23 DIAGNOSIS — T8131XD Disruption of external operation (surgical) wound, not elsewhere classified, subsequent encounter: Secondary | ICD-10-CM

## 2023-04-23 DIAGNOSIS — Z4889 Encounter for other specified surgical aftercare: Secondary | ICD-10-CM

## 2023-04-23 NOTE — Progress Notes (Signed)
    OBSTETRICS AND GYNECOLOGY CLINIC PROGRESS NOTE  Subjective:     Carla Ingram is a 27 y.o. [phone] female who presents today for wound check. Patient has an infraumbilical midline incision status post repeat cesarean section and removal of large adnexal mass (right). Patient notes small amount of bleeding since last dressing change.  Also notes tendernss at bruising sites.   Objective:    BP 123/63   Pulse 86   Resp 16   Ht 5\' 1"  (1.549 m)   Wt 210 lb (95.3 kg)   Breastfeeding No   BMI 39.68 kg/m   Wound:   wound margins intact and healing well.  No signs of infection. Moderate bruising around lower abdomen.     Assessment:    Wound check. Cares provided were:  removal of existing dressing, visual inspection, cleansing with peroxide solution, application of clean dressing with new steri-strips   Plan:   1. Discussed appropriate home care of this wound., Wound redressed. 2. Patient instructions were given. To remove  bandage in 3-4 days. Steri-strips to remain in place until spontaneously loosened.  3. Can u utilize ice packs to the areas of bruising for faster healing. 4. Follow up: 4 weeks for postpartum visit.    Hildred Laser, MD [facility]

## 2023-04-30 ENCOUNTER — Encounter: Payer: Medicaid Other | Admitting: Obstetrics and Gynecology

## 2023-05-05 ENCOUNTER — Telehealth: Payer: Self-pay

## 2023-05-05 NOTE — Telephone Encounter (Signed)
[facility]- Discharge Call Backs Spoke to patient on the phone about the following below. 1-Do you have any questions or concerns about yourself as you heal?  C-Sec incision has an infected spot per patient.  Encouraged her to call her OBGYN to go in to get this assessed.  Pt agreed that she would do this today. 2-Any concerns or questions about your baby? No 3- Reviewed ABC's of safe sleep. 4-How was your stay at the hospital?Good 5- Did our team work together to care for you?Yes You should be receiving a survey in the mail soon.   We would really appreciate it if you could fill that out for [place] and return it in the mail.  We value the feedback to make improvements and continue the great work we do.   If you have any questions please feel free to call me back at [phone]

## 2023-05-12 ENCOUNTER — Telehealth: Payer: Self-pay

## 2023-05-12 NOTE — Telephone Encounter (Signed)
Patient contacted office because she had a scheduled c-section on 04/09/23, patient states that since birth of baby dissolvable stitches are still present. Patient states that she counts 6 stitches that she can visibly see. Patient was seen by Dr. Valentino Saxon on 04/23/23 in her note she wrote   "1. Discussed appropriate home care of this wound., Wound redressed. 2. Patient instructions were given. To remove  bandage in 3-4 days. Steri-strips to remain in place until spontaneously loosened.  3. Can u utilize ice packs to the areas of bruising for faster healing. 4. Follow up: 4 weeks for postpartum visit. "  Patient denies redness, pain, itching or burning at site of incision but states that she is concerned because they have not dissolved. Patient has a scheduled follow up visit with Dr. Valentino Saxon on 05/21/23 and wants to know if she needs to return sooner to have incision looked at? Dr. Valentino Saxon is not in office today, please review chart and advise.KW

## 2023-05-13 NOTE — Telephone Encounter (Signed)
[id number] 

## 2023-05-13 NOTE — Telephone Encounter (Signed)
Patient has been advised she states stiches should have dissolved by now and does not want to wait till her scheduled appointment and wants to be see sooner. Patient denies any discomfort, pain, redness, burning, swelling or itching at site if incision. Patient was transferred to front desk to look for a sooner appt before 5/29. KW

## 2023-05-13 NOTE — Telephone Encounter (Signed)
She does not have stitches, she has absorbable staples. These can take up to 6-8 weeks to dissolve. If we remove them to soon she runs the risk of that area opening up.

## 2023-05-20 NOTE — Progress Notes (Deleted)
   OBSTETRICS POSTPARTUM CLINIC PROGRESS NOTE  Subjective:     Carla Ingram is a 27 y.o. [phone] female who presents for a postpartum visit. She is 6 weeks postpartum following a low cervical transverse Cesarean section. I have fully reviewed the prenatal and intrapartum course. The delivery was at 37.1 gestational weeks.  Anesthesia: spinal. Postpartum course has been ***. Baby's course has been ***. Baby is feeding by {breast/bottle:69}. Bleeding: patient {HAS HAS AVW:09811} not resumed menses, with No LMP recorded.. Bowel function is {normal:32111}. Bladder function is {normal:32111}. Patient {is/is not:9024} sexually active. Contraception method desired is {contraceptive method:5051}. Postpartum depression screening: {neg default:13464::"negative"}.  EDPS score is ***.    The following portions of the patient's history were reviewed and updated as appropriate: allergies, current medications, past family history, past medical history, past social history, past surgical history, and problem list.  Review of Systems {ros; complete:30496}   Objective:    There were no vitals taken for this visit.  General:  alert and no distress   Breasts:  inspection negative, no nipple discharge or bleeding, no masses or nodularity palpable  Lungs: clear to auscultation bilaterally  Heart:  regular rate and rhythm, S1, S2 normal, no murmur, click, rub or gallop  Abdomen: soft, non-tender; bowel sounds normal; no masses,  no organomegaly.  ***Well healed Pfannenstiel incision   Vulva:  normal  Vagina: normal vagina, no discharge, exudate, lesion, or erythema  Cervix:  no cervical motion tenderness and no lesions  Corpus: normal size, contour, position, consistency, mobility, non-tender  Adnexa:  normal adnexa and no mass, fullness, tenderness  Rectal Exam: Not performed.         Labs:  Lab Results  Component Value Date   HGB 7.1 (L) 04/12/2023     Assessment:   No diagnosis found.   Plan:     1. Contraception: {method:5051} 2. Will check Hgb for h/o postpartum anemia of less than 10.  3. Follow up in: {1-10:13787} {time; units:19136} or as needed.    Tommie Raymond, CMA [facility]

## 2023-05-21 ENCOUNTER — Ambulatory Visit: Payer: Medicaid Other | Admitting: Obstetrics and Gynecology

## 2023-05-21 NOTE — Telephone Encounter (Signed)
Patient has a scheduled office visit today with Dr. Valentino Saxon

## 2023-06-10 NOTE — Progress Notes (Unsigned)
   OBSTETRICS POSTPARTUM CLINIC PROGRESS NOTE  Subjective:     Carla Ingram is a 27 y.o. [phone] female who presents for a postpartum visit. She is 6 weeks postpartum following a low cervical transverse Cesarean section. I have fully reviewed the prenatal and intrapartum course. The delivery was at 37.1 gestational weeks. Pregnancy complicatged by obesitgy  Anesthesia: spinal. Postpartum course has been going well. Baby's course has been good. Baby is feeding by bottle - Similac 360 total care . Bleeding: patient has not not resumed menses, with No LMP recorded.. Bowel function is normal. Bladder function is normal. Patient is not sexually active. Contraception method desired is IUD (inserted immediate postpartum during C-section). Postpartum depression screening: negative.  EDPS score is 0.    The following portions of the patient's history were reviewed and updated as appropriate: allergies, current medications, past family history, past medical history, past social history, past surgical history, and problem list.  Review of Systems Pertinent items are noted in HPI.   Objective:    BP (!) 99/58   Pulse 99   Resp 16   Ht 5\' 1"  (1.549 m)   Wt 213 lb 14.4 oz (97 kg)   Breastfeeding No   BMI 40.42 kg/m   General:  alert and no distress   Breasts:  inspection negative, no nipple discharge or bleeding, no masses or nodularity palpable  Lungs: clear to auscultation bilaterally  Heart:  regular rate and rhythm, S1, S2 normal, no murmur, click, rub or gallop  Abdomen: soft, non-tender; bowel sounds normal; no masses,  no organomegaly.  Well healed Pfannenstiel incision   Pelvis:  Deferred as patient notes she has another appointment and will need to leave soon.             06/11/2023    9:47 AM 04/11/2023   12:00 PM 10/02/2022    8:37 AM  Edinburgh Postnatal Depression Scale Screening Tool  I have been able to laugh and see the funny side of things. 0 0 0  I have looked forward with  enjoyment to things. 0 0 0  I have blamed myself unnecessarily when things went wrong. 0 0 0  I have been anxious or worried for no good reason. 0 0 0  I have felt scared or panicky for no good reason. 0 0 0  Things have been getting on top of me. 0 0 3  I have been so unhappy that I have had difficulty sleeping. 0 0 0  I have felt sad or miserable. 0 0 0  I have been so unhappy that I have been crying. 0 0 0  The thought of harming myself has occurred to me. 0 0 0  Edinburgh Postnatal Depression Scale Total 0 0 3     Labs:  Lab Results  Component Value Date   HGB 7.1 (L) 04/12/2023     Assessment:   1. Postpartum care following cesarean delivery   2. Cervical cancer screening      Plan:   1. Contraception: IUD 2. Need to check Hgb for h/o postpartum anemia of less than 10 however patient declines staying for labs. Is asymptomatic.   3. IUD in place, patient declined pelvic exam to asess for strings due to having to leave to go to another appointment.  4. Follow up in:  2-3  months for annual exam    Hildred Laser, MD [facility]

## 2023-06-11 ENCOUNTER — Encounter: Payer: Self-pay | Admitting: Obstetrics and Gynecology

## 2023-06-11 ENCOUNTER — Ambulatory Visit (INDEPENDENT_AMBULATORY_CARE_PROVIDER_SITE_OTHER): Payer: Medicaid Other | Admitting: Obstetrics and Gynecology

## 2023-06-11 DIAGNOSIS — Z124 Encounter for screening for malignant neoplasm of cervix: Secondary | ICD-10-CM

## 2023-06-11 NOTE — Patient Instructions (Signed)

## 2023-07-31 DIAGNOSIS — R109 Unspecified abdominal pain: Secondary | ICD-10-CM | POA: Diagnosis not present

## 2023-07-31 DIAGNOSIS — Z8759 Personal history of other complications of pregnancy, childbirth and the puerperium: Secondary | ICD-10-CM | POA: Diagnosis not present

## 2023-07-31 DIAGNOSIS — R58 Hemorrhage, not elsewhere classified: Secondary | ICD-10-CM | POA: Diagnosis not present

## 2023-08-14 ENCOUNTER — Telehealth: Payer: Self-pay

## 2023-08-14 NOTE — Telephone Encounter (Signed)
Morrie Sheldon from [place] calling regarding DOS 04/09/23; pt recv'd IUD; what complication occurred so that a second device was inserted same day?  [phone] ok to lm; ref# [id number]

## 2023-08-14 NOTE — Telephone Encounter (Signed)
I truly can't remember exactly what happened. There was something going on with the device itself prior to insertion that we could not use it (I can't remember if it was dropped by the OR tech prior to insertion after removal from the applicator and could not be used, or if it would not deploy from the device and we could not get it out).  There is likely documentation at the hospital regarding the device and why it had to be replaced in the OR.

## 2023-08-19 NOTE — Telephone Encounter (Signed)
2nd attempt; same msg.

## 2023-08-19 NOTE — Telephone Encounter (Signed)
This number is temporarily unavailable.

## 2023-08-21 NOTE — Telephone Encounter (Signed)
Morrie Sheldon calling; I had her phone number wrong is why I wasn't able to get her.  I told her the general consensus is that one device was dropped on the floor during the procedure therefore another one was gotten.

## 2023-10-13 NOTE — Progress Notes (Deleted)
    GYNECOLOGY PROGRESS NOTE  Subjective:    Patient ID: Carla Ingram, female    DOB: [birth year], 27 y.o.   MRN: [id number]  HPI  Patient is a 27 y.o. [phone] female who presents for evaluation of right ovarian cyst. She reports that the cyst is very painful. She had a CT scan on 03/2023 for Hypotension and abdominal distension. Status post C-section. Evaluate for intra-abdominal bleed. The results notes:  Reproductive: The uterus is mildly enlarged in keeping with postpartum state. Anterior uterine C-section incision noted. An intrauterine device is noted in the upper endometrium. The arms of the IUD do not appear in appropriate positioning. Attention on follow-up imaging recommended. Small pockets of air along the C-section incision in the lower uterus.  {Common ambulatory SmartLinks:19316}  Review of Systems {ros; complete:30496}   Objective:   not currently breastfeeding. There is no height or weight on file to calculate BMI. General appearance: {general exam:16600} Abdomen: {abdominal exam:16834} Pelvic: {pelvic exam:16852::"cervix normal in appearance","external genitalia normal","no adnexal masses or tenderness","no cervical motion tenderness","rectovaginal septum normal","uterus normal size, shape, and consistency","vagina normal without discharge"} Extremities: {extremity exam:5109} Neurologic: {neuro exam:17854}   Assessment:   1. Cervical cancer screening      Plan:   There are no diagnoses linked to this encounter.    Hildred Laser, MD [facility]

## 2023-10-14 ENCOUNTER — Ambulatory Visit: Payer: Medicaid Other | Admitting: Obstetrics and Gynecology

## 2023-10-14 DIAGNOSIS — N83201 Unspecified ovarian cyst, right side: Secondary | ICD-10-CM

## 2023-10-14 DIAGNOSIS — Z124 Encounter for screening for malignant neoplasm of cervix: Secondary | ICD-10-CM

## 2023-10-21 ENCOUNTER — Ambulatory Visit: Payer: Medicaid Other | Admitting: Obstetrics and Gynecology

## 2023-10-21 DIAGNOSIS — Z0000 Encounter for general adult medical examination without abnormal findings: Secondary | ICD-10-CM

## 2023-10-21 DIAGNOSIS — Z124 Encounter for screening for malignant neoplasm of cervix: Secondary | ICD-10-CM

## 2023-10-21 NOTE — Progress Notes (Deleted)
GYNECOLOGY ANNUAL PHYSICAL EXAM PROGRESS NOTE  Subjective:    Carla Ingram is a 27 y.o. [phone] female who presents for an annual exam.  The patient {is/is not/has never been:13135} sexually active. The patient participates in regular exercise: {yes/no/not asked:9010}. Has the patient ever been transfused or tattooed?: {yes/no/not asked:9010}. The patient reports that there {is/is not:9024} domestic violence in her life.   The patient has the following complaints today:   Menstrual History: Menarche age: *** No LMP recorded. (Menstrual status: IUD).     Gynecologic History:  Contraception: {method:5051} History of STI's:  Last Pap: ***. Results were: {norm/abn:16337}.  ***Denies/Notes h/o abnormal pap smears. Last mammogram: ***. Results were: {norm/abn:16337}       OB History  Gravida Para Term Preterm AB Living  5 5 4 1  0 5  SAB IAB Ectopic Multiple Live Births  0 0 0 0 5    # Outcome Date GA Lbr Len/2nd Weight Sex Type Anes PTL Lv  5 Term 04/09/23 [redacted]w[redacted]d  5 lb 11.7 oz (2.6 kg) M CS-LTranv Spinal  LIV     Name: Scheuermann,BOY Ayauna     Apgar1: 8  Apgar5: 9  4 Term 11/25/19 [redacted]w[redacted]d  7 lb 0.5 oz (3.19 kg) M CS-LTranv Spinal  LIV     Name: Sachdeva,BOY Vannia     Apgar1: 9  Apgar5: 9  3 Term 07/09/16 [redacted]w[redacted]d  7 lb 5.5 oz (3.33 kg) F CS-LTranv Spinal  LIV     Name: SKYLAR     Apgar1: 9  Apgar5: 9  2 Term 01/19/15 [redacted]w[redacted]d  6 lb 10 oz (3.005 kg) F CS-LTranv Spinal  LIV     Name: ALEXIS  1 Preterm 02/02/11 [redacted]w[redacted]d  5 lb 5 oz (2.41 kg) F CS-LTranv Gen  LIV     Complications: Abruptio Placenta    Past Medical History:  Diagnosis Date   Anemia    GERD (gastroesophageal reflux disease)    OCC   Migraine    Placental abruption    2012 at 35 wks   Positive pregnancy test    Psoriasis    TMJ (dislocation of temporomandibular joint)     Past Surgical History:  Procedure Laterality Date   CESAREAN SECTION  2012, 2016   CESAREAN SECTION N/A 07/09/2016   Procedure:  CESAREAN SECTION;  Surgeon: Nadara Mustard, MD;  Location: [facility];  Service: Obstetrics;  Laterality: N/A;   CESAREAN SECTION N/A 11/25/2019   Procedure: CESAREAN SECTION;  Surgeon: Nadara Mustard, MD;  Location: [facility];  Service: Obstetrics;  Laterality: N/A;   CESAREAN SECTION N/A 04/09/2023   Procedure: REPEAT CESAREAN SECTION;  Surgeon: Hildred Laser, MD;  Location: [facility];  Service: Obstetrics;  Laterality: N/A;   IR EMBO ART  VEN HEMORR LYMPH EXTRAV  INC GUIDE ROADMAPPING  04/10/2023   OOPHORECTOMY Right 04/09/2023   Procedure: OOPHORECTOMY;  Surgeon: Hildred Laser, MD;  Location: [facility];  Service: Obstetrics;  Laterality: Right;   WISDOM TOOTH EXTRACTION     3; age14    Family History  Adopted: Yes  Problem Relation Age of Onset   CVA Mother    Stroke Mother        during pregnancy   ADD / ADHD Sister    ADD / ADHD Brother    ADD / ADHD Brother    ADD / ADHD Brother    ADD / ADHD Brother     Social History   Socioeconomic History   Marital  status: Single    Spouse name: Delorse Lek   Number of children: 4   Years of education: 9   Highest education level: 9th grade  Occupational History   Occupation: unemployed   Tobacco Use   Smoking status: Every Day    Current packs/day: 0.50    Average packs/day: 0.5 packs/day for 19.0 years (9.5 ttl pk-yrs)    Types: Cigarettes    Start date: 10/31/2004   Smokeless tobacco: Never  Vaping Use   Vaping status: Former  Substance and Sexual Activity   Alcohol use: Not Currently    Alcohol/week: 8.0 standard drinks of alcohol    Types: 8 Shots of liquor per week    Comment: last use- 2021, she binge drinks when has a weekend without kids - rum   Drug use: No   Sexual activity: Yes    Partners: Male    Birth control/protection: Injection, I.U.D.  Other Topics Concern   Not on file  Social History Narrative   Not on file   Social Determinants of Health   Financial Resource Strain: Low Risk  (10/02/2022)    Overall Financial Resource Strain (CARDIA)    Difficulty of Paying Living Expenses: Not hard at all  Food Insecurity: No Food Insecurity (04/09/2023)   Hunger Vital Sign    Worried About Running Out of Food in the Last Year: Never true    Ran Out of Food in the Last Year: Never true  Transportation Needs: No Transportation Needs (04/09/2023)   PRAPARE - Administrator, Civil Service (Medical): No    Lack of Transportation (Non-Medical): No  Physical Activity: Inactive (10/02/2022)   Exercise Vital Sign    Days of Exercise per Week: 0 days    Minutes of Exercise per Session: 0 min  Stress: No Stress Concern Present (10/02/2022)   [employer] of Occupational Health - Occupational Stress Questionnaire    Feeling of Stress : Not at all  Social Connections: Moderately Isolated (10/02/2022)   Social Connection and Isolation Panel [NHANES]    Frequency of Communication with Friends and Family: More than three times a week    Frequency of Social Gatherings with Friends and Family: Twice a week    Attends Religious Services: Never    Database administrator or Organizations: No    Attends Banker Meetings: Never    Marital Status: Living with partner  Intimate Partner Violence: Not At Risk (04/09/2023)   Humiliation, Afraid, Rape, and Kick questionnaire    Fear of Current or Ex-Partner: No    Emotionally Abused: No    Physically Abused: No    Sexually Abused: No    Current Outpatient Medications on File Prior to Visit  Medication Sig Dispense Refill   ferrous sulfate 325 (65 FE) MG tablet Take 1 tablet (325 mg total) by mouth 2 (two) times daily with a meal. 60 tablet 3   Prenatal Vit-Fe Fumarate-FA (MULTIVITAMIN-PRENATAL) 27-0.8 MG TABS tablet Take 1 tablet by mouth daily at 12 noon.     No current facility-administered medications on file prior to visit.    Allergies  Allergen Reactions   Macrobid [Nitrofurantoin Macrocrystal] Other (See Comments)    Can  not move her body, loses all control of arms legs    Augmentin [Amoxicillin-Pot Clavulanate] Hives and Swelling   Bactrim [Sulfamethoxazole-Trimethoprim] Other (See Comments)    Pt reports body aches    Cephalexin Other (See Comments)    Achy in bones  Penicillins Hives and Swelling    Did it involve swelling of the face/tongue/throat, SOB, or low BP? No Did it involve sudden or severe rash/hives, skin peeling, or any reaction on the inside of your mouth or nose? No Did you need to seek medical attention at a hospital or doctor's office? No When did it last happen?  childhood allergy     If all above answers are "NO", may proceed with cephalosporin use.      Review of Systems Constitutional: negative for chills, fatigue, fevers and sweats Eyes: negative for irritation, redness and visual disturbance Ears, nose, mouth, throat, and face: negative for hearing loss, nasal congestion, snoring and tinnitus Respiratory: negative for asthma, cough, sputum Cardiovascular: negative for chest pain, dyspnea, exertional chest pressure/discomfort, irregular heart beat, palpitations and syncope Gastrointestinal: negative for abdominal pain, change in bowel habits, nausea and vomiting Genitourinary: negative for abnormal menstrual periods, genital lesions, sexual problems and vaginal discharge, dysuria and urinary incontinence Integument/breast: negative for breast lump, breast tenderness and nipple discharge Hematologic/lymphatic: negative for bleeding and easy bruising Musculoskeletal:negative for back pain and muscle weakness Neurological: negative for dizziness, headaches, vertigo and weakness Endocrine: negative for diabetic symptoms including polydipsia, polyuria and skin dryness Allergic/Immunologic: negative for hay fever and urticaria      Objective:  not currently breastfeeding. There is no height or weight on file to calculate BMI.    General Appearance:    Alert, cooperative, no  distress, appears stated age  Head:    Normocephalic, without obvious abnormality, atraumatic  Eyes:    PERRL, conjunctiva/corneas clear, EOM's intact, both eyes  Ears:    Normal external ear canals, both ears  Nose:   Nares normal, septum midline, mucosa normal, no drainage or sinus tenderness  Throat:   Lips, mucosa, and tongue normal; teeth and gums normal  Neck:   Supple, symmetrical, trachea midline, no adenopathy; thyroid: no enlargement/tenderness/nodules; no carotid bruit or JVD  Back:     Symmetric, no curvature, ROM normal, no CVA tenderness  Lungs:     Clear to auscultation bilaterally, respirations unlabored  Chest Wall:    No tenderness or deformity   Heart:    Regular rate and rhythm, S1 and S2 normal, no murmur, rub or gallop  Breast Exam:    No tenderness, masses, or nipple abnormality  Abdomen:     Soft, non-tender, bowel sounds active all four quadrants, no masses, no organomegaly.    Genitalia:    Pelvic:external genitalia normal, vagina without lesions, discharge, or tenderness, rectovaginal septum  normal. Cervix normal in appearance, no cervical motion tenderness, no adnexal masses or tenderness.  Uterus normal size, shape, mobile, regular contours, nontender.  Rectal:    Normal external sphincter.  No hemorrhoids appreciated. Internal exam not done.   Extremities:   Extremities normal, atraumatic, no cyanosis or edema  Pulses:   2+ and symmetric all extremities  Skin:   Skin color, texture, turgor normal, no rashes or lesions  Lymph nodes:   Cervical, supraclavicular, and axillary nodes normal  Neurologic:   CNII-XII intact, normal strength, sensation and reflexes throughout   .  Labs:  Lab Results  Component Value Date   WBC 8.9 04/12/2023   HGB 7.1 (L) 04/12/2023   HCT 21.3 (L) 04/12/2023   MCV 91.8 04/12/2023   PLT 285 04/12/2023    Lab Results  Component Value Date   CREATININE 0.65 04/09/2023   BUN 8 04/09/2023   NA 135 04/09/2023   K 4.3 04/09/2023  CL 110 04/09/2023   CO2 20 (L) 04/09/2023    Lab Results  Component Value Date   ALT 13 04/09/2023   AST 23 04/09/2023   ALKPHOS 78 04/09/2023   BILITOT 0.4 04/09/2023    No results found for: "TSH"   Assessment:   1. Cervical cancer screening   2. Annual physical exam      Plan:  Blood tests: {blood tests:13147}. Breast self exam technique reviewed and patient encouraged to perform self-exam monthly. Contraception: {contraceptive methods:5051}. Discussed healthy lifestyle modifications. Mammogram {discussed/ordered:14545} Pap smear {discussed/ordered:14545}. Flu vaccine: Follow up in 1 year for annual exam   Rocco Serene, LPN [facility]

## 2023-12-31 DIAGNOSIS — N390 Urinary tract infection, site not specified: Secondary | ICD-10-CM | POA: Diagnosis not present

## 2024-01-09 DIAGNOSIS — F900 Attention-deficit hyperactivity disorder, predominantly inattentive type: Secondary | ICD-10-CM | POA: Diagnosis not present

## 2024-01-31 DIAGNOSIS — H9201 Otalgia, right ear: Secondary | ICD-10-CM | POA: Diagnosis not present

## 2024-01-31 DIAGNOSIS — J069 Acute upper respiratory infection, unspecified: Secondary | ICD-10-CM | POA: Diagnosis not present

## 2024-01-31 DIAGNOSIS — R220 Localized swelling, mass and lump, head: Secondary | ICD-10-CM | POA: Diagnosis not present

## 2024-02-01 DIAGNOSIS — R220 Localized swelling, mass and lump, head: Secondary | ICD-10-CM | POA: Diagnosis not present

## 2024-02-01 DIAGNOSIS — L03211 Cellulitis of face: Secondary | ICD-10-CM | POA: Diagnosis not present

## 2024-02-01 DIAGNOSIS — K122 Cellulitis and abscess of mouth: Secondary | ICD-10-CM | POA: Diagnosis not present

## 2024-04-01 DIAGNOSIS — F900 Attention-deficit hyperactivity disorder, predominantly inattentive type: Secondary | ICD-10-CM | POA: Diagnosis not present

## 2024-05-28 NOTE — Telephone Encounter (Signed)
 error

## 2024-06-08 DIAGNOSIS — M25571 Pain in right ankle and joints of right foot: Secondary | ICD-10-CM | POA: Diagnosis not present

## 2024-06-08 DIAGNOSIS — S93491A Sprain of other ligament of right ankle, initial encounter: Secondary | ICD-10-CM | POA: Diagnosis not present

## 2024-07-01 DIAGNOSIS — F900 Attention-deficit hyperactivity disorder, predominantly inattentive type: Secondary | ICD-10-CM | POA: Diagnosis not present

## 2024-07-10 DIAGNOSIS — L0291 Cutaneous abscess, unspecified: Secondary | ICD-10-CM | POA: Diagnosis not present

## 2024-07-13 DIAGNOSIS — R220 Localized swelling, mass and lump, head: Secondary | ICD-10-CM | POA: Diagnosis not present

## 2024-07-19 DIAGNOSIS — S93401A Sprain of unspecified ligament of right ankle, initial encounter: Secondary | ICD-10-CM | POA: Diagnosis not present

## 2024-08-03 DIAGNOSIS — M7661 Achilles tendinitis, right leg: Secondary | ICD-10-CM | POA: Diagnosis not present

## 2024-08-03 DIAGNOSIS — S93411A Sprain of calcaneofibular ligament of right ankle, initial encounter: Secondary | ICD-10-CM | POA: Diagnosis not present

## 2024-08-03 DIAGNOSIS — M7751 Other enthesopathy of right foot: Secondary | ICD-10-CM | POA: Diagnosis not present

## 2024-08-03 DIAGNOSIS — M722 Plantar fascial fibromatosis: Secondary | ICD-10-CM | POA: Diagnosis not present

## 2024-08-03 DIAGNOSIS — S93401A Sprain of unspecified ligament of right ankle, initial encounter: Secondary | ICD-10-CM | POA: Diagnosis not present

## 2024-08-03 DIAGNOSIS — R936 Abnormal findings on diagnostic imaging of limbs: Secondary | ICD-10-CM | POA: Diagnosis not present

## 2024-08-03 DIAGNOSIS — S93421A Sprain of deltoid ligament of right ankle, initial encounter: Secondary | ICD-10-CM | POA: Diagnosis not present

## 2024-08-03 DIAGNOSIS — S93491A Sprain of other ligament of right ankle, initial encounter: Secondary | ICD-10-CM | POA: Diagnosis not present

## 2024-08-10 DIAGNOSIS — S93401A Sprain of unspecified ligament of right ankle, initial encounter: Secondary | ICD-10-CM | POA: Diagnosis not present

## 2024-10-07 DIAGNOSIS — F900 Attention-deficit hyperactivity disorder, predominantly inattentive type: Secondary | ICD-10-CM | POA: Diagnosis not present
